# Patient Record
Sex: Female | Born: 1950 | Race: White | Hispanic: No | Marital: Married | Smoking: Former smoker
Health system: Southern US, Community
[De-identification: ages and names within clinical notes are randomized; demographics above are authoritative.]

## PROBLEM LIST (undated history)

## (undated) DIAGNOSIS — E119 Type 2 diabetes mellitus without complications: Secondary | ICD-10-CM

## (undated) DIAGNOSIS — F419 Anxiety disorder, unspecified: Secondary | ICD-10-CM

## (undated) DIAGNOSIS — L900 Lichen sclerosus et atrophicus: Secondary | ICD-10-CM

## (undated) DIAGNOSIS — F32A Depression, unspecified: Secondary | ICD-10-CM

## (undated) DIAGNOSIS — M1990 Unspecified osteoarthritis, unspecified site: Secondary | ICD-10-CM

## (undated) DIAGNOSIS — I471 Supraventricular tachycardia, unspecified: Secondary | ICD-10-CM

## (undated) DIAGNOSIS — F329 Major depressive disorder, single episode, unspecified: Secondary | ICD-10-CM

## (undated) DIAGNOSIS — K219 Gastro-esophageal reflux disease without esophagitis: Secondary | ICD-10-CM

## (undated) DIAGNOSIS — E785 Hyperlipidemia, unspecified: Secondary | ICD-10-CM

## (undated) DIAGNOSIS — J40 Bronchitis, not specified as acute or chronic: Secondary | ICD-10-CM

## (undated) DIAGNOSIS — I10 Essential (primary) hypertension: Secondary | ICD-10-CM

## (undated) HISTORY — DX: Type 2 diabetes mellitus without complications: E11.9

## (undated) HISTORY — DX: Lichen sclerosus et atrophicus: L90.0

## (undated) HISTORY — DX: Gastro-esophageal reflux disease without esophagitis: K21.9

## (undated) HISTORY — DX: Supraventricular tachycardia: I47.1

## (undated) HISTORY — DX: Hyperlipidemia, unspecified: E78.5

## (undated) HISTORY — DX: Supraventricular tachycardia, unspecified: I47.10

## (undated) HISTORY — DX: Essential (primary) hypertension: I10

## (undated) HISTORY — DX: Major depressive disorder, single episode, unspecified: F32.9

## (undated) HISTORY — DX: Anxiety disorder, unspecified: F41.9

## (undated) HISTORY — DX: Depression, unspecified: F32.A

## (undated) HISTORY — DX: Unspecified osteoarthritis, unspecified site: M19.90

---

## 1999-10-19 ENCOUNTER — Other Ambulatory Visit: Admission: RE | Admit: 1999-10-19 | Discharge: 1999-10-19 | Payer: Self-pay | Admitting: Family Medicine

## 1999-11-01 ENCOUNTER — Ambulatory Visit (HOSPITAL_COMMUNITY): Admission: RE | Admit: 1999-11-01 | Discharge: 1999-11-01 | Payer: Self-pay | Admitting: Family Medicine

## 1999-11-01 ENCOUNTER — Encounter: Payer: Self-pay | Admitting: Family Medicine

## 2002-01-10 ENCOUNTER — Encounter: Payer: Self-pay | Admitting: Emergency Medicine

## 2002-01-10 ENCOUNTER — Emergency Department (HOSPITAL_COMMUNITY): Admission: EM | Admit: 2002-01-10 | Discharge: 2002-01-10 | Payer: Self-pay | Admitting: Emergency Medicine

## 2002-11-30 ENCOUNTER — Emergency Department (HOSPITAL_COMMUNITY): Admission: EM | Admit: 2002-11-30 | Discharge: 2002-11-30 | Payer: Self-pay | Admitting: Emergency Medicine

## 2003-01-28 ENCOUNTER — Emergency Department (HOSPITAL_COMMUNITY): Admission: EM | Admit: 2003-01-28 | Discharge: 2003-01-28 | Payer: Self-pay | Admitting: *Deleted

## 2003-10-26 ENCOUNTER — Emergency Department (HOSPITAL_COMMUNITY): Admission: EM | Admit: 2003-10-26 | Discharge: 2003-10-26 | Payer: Self-pay | Admitting: Emergency Medicine

## 2003-11-18 ENCOUNTER — Emergency Department (HOSPITAL_COMMUNITY): Admission: EM | Admit: 2003-11-18 | Discharge: 2003-11-18 | Payer: Self-pay | Admitting: Emergency Medicine

## 2004-01-25 ENCOUNTER — Ambulatory Visit (HOSPITAL_COMMUNITY): Admission: RE | Admit: 2004-01-25 | Discharge: 2004-01-25 | Payer: Self-pay | Admitting: Pulmonary Disease

## 2004-03-02 ENCOUNTER — Ambulatory Visit (HOSPITAL_COMMUNITY): Admission: RE | Admit: 2004-03-02 | Discharge: 2004-03-02 | Payer: Self-pay | Admitting: Emergency Medicine

## 2004-06-14 ENCOUNTER — Emergency Department (HOSPITAL_COMMUNITY): Admission: EM | Admit: 2004-06-14 | Discharge: 2004-06-14 | Payer: Self-pay | Admitting: *Deleted

## 2005-07-25 ENCOUNTER — Emergency Department (HOSPITAL_COMMUNITY): Admission: EM | Admit: 2005-07-25 | Discharge: 2005-07-26 | Payer: Self-pay | Admitting: Emergency Medicine

## 2005-08-14 ENCOUNTER — Ambulatory Visit (HOSPITAL_COMMUNITY): Admission: RE | Admit: 2005-08-14 | Discharge: 2005-08-14 | Payer: Self-pay | Admitting: General Surgery

## 2005-08-20 HISTORY — PX: CHOLECYSTECTOMY: SHX55

## 2005-09-05 ENCOUNTER — Ambulatory Visit (HOSPITAL_COMMUNITY): Admission: RE | Admit: 2005-09-05 | Discharge: 2005-09-05 | Payer: Self-pay | Admitting: General Surgery

## 2005-09-05 ENCOUNTER — Encounter (INDEPENDENT_AMBULATORY_CARE_PROVIDER_SITE_OTHER): Payer: Self-pay | Admitting: General Surgery

## 2005-10-06 ENCOUNTER — Emergency Department (HOSPITAL_COMMUNITY): Admission: EM | Admit: 2005-10-06 | Discharge: 2005-10-06 | Payer: Self-pay | Admitting: Emergency Medicine

## 2007-09-16 ENCOUNTER — Ambulatory Visit: Payer: Self-pay | Admitting: Cardiology

## 2007-10-04 ENCOUNTER — Ambulatory Visit: Payer: Self-pay | Admitting: Cardiology

## 2007-11-07 ENCOUNTER — Ambulatory Visit: Payer: Self-pay | Admitting: Cardiology

## 2007-12-03 ENCOUNTER — Ambulatory Visit: Payer: Self-pay | Admitting: Cardiovascular Disease

## 2007-12-03 ENCOUNTER — Encounter (HOSPITAL_COMMUNITY): Admission: RE | Admit: 2007-12-03 | Discharge: 2008-01-02 | Payer: Self-pay | Admitting: Cardiology

## 2007-12-18 ENCOUNTER — Ambulatory Visit: Payer: Self-pay | Admitting: Cardiology

## 2008-01-07 ENCOUNTER — Ambulatory Visit: Payer: Self-pay | Admitting: Internal Medicine

## 2008-01-15 ENCOUNTER — Ambulatory Visit (HOSPITAL_COMMUNITY): Admission: RE | Admit: 2008-01-15 | Discharge: 2008-01-15 | Payer: Self-pay | Admitting: Family Medicine

## 2008-07-27 ENCOUNTER — Ambulatory Visit: Payer: Self-pay | Admitting: Cardiology

## 2008-07-27 DIAGNOSIS — K219 Gastro-esophageal reflux disease without esophagitis: Secondary | ICD-10-CM

## 2008-07-27 DIAGNOSIS — I471 Supraventricular tachycardia, unspecified: Secondary | ICD-10-CM | POA: Insufficient documentation

## 2008-07-27 DIAGNOSIS — J4530 Mild persistent asthma, uncomplicated: Secondary | ICD-10-CM | POA: Insufficient documentation

## 2008-12-05 ENCOUNTER — Emergency Department (HOSPITAL_COMMUNITY): Admission: EM | Admit: 2008-12-05 | Discharge: 2008-12-05 | Payer: Self-pay | Admitting: Emergency Medicine

## 2008-12-17 ENCOUNTER — Encounter (INDEPENDENT_AMBULATORY_CARE_PROVIDER_SITE_OTHER): Payer: Self-pay | Admitting: *Deleted

## 2009-04-28 ENCOUNTER — Encounter (INDEPENDENT_AMBULATORY_CARE_PROVIDER_SITE_OTHER): Payer: Self-pay | Admitting: *Deleted

## 2009-04-28 LAB — CONVERTED CEMR LAB
ALT: 40 units/L
AST: 28 units/L
CO2: 21 meq/L
Calcium: 9.9 mg/dL
Chloride: 104 meq/L
Creatinine, Ser: 0.85 mg/dL
HDL: 36 mg/dL
Hgb A1c MFr Bld: 8.4 %
LDL Cholesterol: 126 mg/dL
Potassium: 4.2 meq/L
Sodium: 141 meq/L
TSH: 1.302 microintl units/mL
Total Protein: 7.4 g/dL

## 2009-05-09 ENCOUNTER — Ambulatory Visit: Admission: RE | Admit: 2009-05-09 | Discharge: 2009-05-09 | Payer: Self-pay | Admitting: Family Medicine

## 2009-05-10 ENCOUNTER — Encounter: Payer: Self-pay | Admitting: Family Medicine

## 2009-05-10 ENCOUNTER — Ambulatory Visit (HOSPITAL_COMMUNITY): Admission: RE | Admit: 2009-05-10 | Discharge: 2009-05-10 | Payer: Self-pay | Admitting: Family Medicine

## 2009-05-19 ENCOUNTER — Other Ambulatory Visit: Admission: RE | Admit: 2009-05-19 | Discharge: 2009-05-19 | Payer: Self-pay | Admitting: Family Medicine

## 2009-06-21 ENCOUNTER — Ambulatory Visit: Payer: Self-pay | Admitting: Internal Medicine

## 2009-06-21 DIAGNOSIS — K625 Hemorrhage of anus and rectum: Secondary | ICD-10-CM | POA: Insufficient documentation

## 2009-06-21 DIAGNOSIS — K649 Unspecified hemorrhoids: Secondary | ICD-10-CM | POA: Insufficient documentation

## 2009-06-30 ENCOUNTER — Telehealth: Payer: Self-pay | Admitting: Cardiology

## 2009-07-01 ENCOUNTER — Encounter: Payer: Self-pay | Admitting: Internal Medicine

## 2009-07-06 ENCOUNTER — Telehealth (INDEPENDENT_AMBULATORY_CARE_PROVIDER_SITE_OTHER): Payer: Self-pay

## 2009-07-20 ENCOUNTER — Ambulatory Visit (HOSPITAL_COMMUNITY): Admission: RE | Admit: 2009-07-20 | Discharge: 2009-07-20 | Payer: Self-pay | Admitting: Internal Medicine

## 2009-07-20 ENCOUNTER — Ambulatory Visit: Payer: Self-pay | Admitting: Internal Medicine

## 2009-07-27 ENCOUNTER — Encounter (INDEPENDENT_AMBULATORY_CARE_PROVIDER_SITE_OTHER): Payer: Self-pay | Admitting: *Deleted

## 2009-08-25 ENCOUNTER — Encounter (INDEPENDENT_AMBULATORY_CARE_PROVIDER_SITE_OTHER): Payer: Self-pay | Admitting: *Deleted

## 2009-08-25 LAB — CONVERTED CEMR LAB
ALT: 25 units/L
BUN: 16 mg/dL
CO2: 23 meq/L
Calcium: 9.6 mg/dL
Chloride: 104 meq/L
Creatinine, Ser: 0.82 mg/dL
HDL: 34 mg/dL
Hgb A1c MFr Bld: 7.2 %
Potassium: 4.5 meq/L
Total Protein: 6.6 g/dL

## 2009-08-31 ENCOUNTER — Encounter (INDEPENDENT_AMBULATORY_CARE_PROVIDER_SITE_OTHER): Payer: Self-pay | Admitting: *Deleted

## 2010-01-12 ENCOUNTER — Telehealth (INDEPENDENT_AMBULATORY_CARE_PROVIDER_SITE_OTHER): Payer: Self-pay | Admitting: *Deleted

## 2010-01-23 ENCOUNTER — Encounter: Payer: Self-pay | Admitting: Cardiology

## 2010-01-24 ENCOUNTER — Encounter: Payer: Self-pay | Admitting: Adult Health

## 2010-01-24 ENCOUNTER — Ambulatory Visit: Payer: Self-pay | Admitting: Cardiology

## 2010-01-24 DIAGNOSIS — J45901 Unspecified asthma with (acute) exacerbation: Secondary | ICD-10-CM | POA: Insufficient documentation

## 2010-01-24 DIAGNOSIS — Z8679 Personal history of other diseases of the circulatory system: Secondary | ICD-10-CM | POA: Insufficient documentation

## 2010-01-24 DIAGNOSIS — R002 Palpitations: Secondary | ICD-10-CM | POA: Insufficient documentation

## 2010-04-17 ENCOUNTER — Telehealth: Payer: Self-pay | Admitting: Internal Medicine

## 2010-04-17 ENCOUNTER — Ambulatory Visit: Payer: Self-pay | Admitting: Internal Medicine

## 2010-05-31 ENCOUNTER — Telehealth: Payer: Self-pay | Admitting: Internal Medicine

## 2010-06-13 ENCOUNTER — Encounter: Payer: Self-pay | Admitting: Internal Medicine

## 2010-06-20 HISTORY — PX: OTHER SURGICAL HISTORY: SHX169

## 2010-06-26 ENCOUNTER — Telehealth: Payer: Self-pay | Admitting: Internal Medicine

## 2010-06-27 ENCOUNTER — Encounter: Payer: Self-pay | Admitting: Internal Medicine

## 2010-07-03 ENCOUNTER — Ambulatory Visit (HOSPITAL_COMMUNITY): Admission: RE | Admit: 2010-07-03 | Payer: Self-pay | Admitting: Family Medicine

## 2010-07-06 ENCOUNTER — Ambulatory Visit: Payer: Self-pay | Admitting: Internal Medicine

## 2010-07-06 ENCOUNTER — Ambulatory Visit (HOSPITAL_COMMUNITY): Admission: RE | Admit: 2010-07-06 | Discharge: 2010-07-07 | Payer: Self-pay | Admitting: Internal Medicine

## 2010-08-01 ENCOUNTER — Ambulatory Visit: Payer: Self-pay | Admitting: Internal Medicine

## 2010-08-01 DIAGNOSIS — E119 Type 2 diabetes mellitus without complications: Secondary | ICD-10-CM

## 2010-08-03 ENCOUNTER — Telehealth (INDEPENDENT_AMBULATORY_CARE_PROVIDER_SITE_OTHER): Payer: Self-pay | Admitting: *Deleted

## 2010-08-28 ENCOUNTER — Telehealth (INDEPENDENT_AMBULATORY_CARE_PROVIDER_SITE_OTHER): Payer: Self-pay | Admitting: *Deleted

## 2010-09-09 ENCOUNTER — Encounter: Payer: Self-pay | Admitting: Family Medicine

## 2010-09-19 NOTE — Progress Notes (Signed)
Summary: re appt prior to ablation   Phone Note Call from Patient   Caller:  [phone] Patient Reason for Call: Talk to Nurse Summary of Call: pt calling re appt for office visit prior to ablation , said she was told she would hgave to come back in if her appt has been over a month, and she cannot afford the copay-what can she do? Initial call taken by: Glynda Jaeger,  June 26, 2010 4:05 PM  Follow-up for Phone Call        pt aware to be at the hospital at 10:30 and not to eat or drink after midnight  She is to hold all diabetic meds and Dr Johney Frame is going to do H&P at the hospital.   Dennis Bast, RN, BSN  June 27, 2010 10:56 AM

## 2010-09-19 NOTE — Letter (Signed)
Summary: EKG 01-11-10  EKG 01-11-10   Imported By: Faythe Ghee 01/23/2010 12:26:14  _____________________________________________________________________  External Attachment:    Type:   Image     Comment:   External Document

## 2010-09-19 NOTE — Assessment & Plan Note (Signed)
Summary: nep      Allergies Added: NKDA  Visit Type:  Follow-up Primary Provider:  Renaye Rakers   History of Present Illness: Ms Lastra is a pleasant 60 yo WF with a h/o SVT, asthma, DM, and HTN who presents today for EP evaluation.  She has had a long history of tachy palpitations and documented SVTs, at rates up to over 200 beats per minute.   She describes abrupt onset/ offset of tachypalpitations, lasting <10 minutes.  She is unaware of triggers/ precipitants for these.  She reports that with valsalva maneuvers, tachycardia would occasionally terminate, however since being on diltiazem, she denies having these episodes. She also reports episodes of chest tightness with beating into her throat.  She states that when she has been evaluated by EMS during these episodes, she has been in sinus bradycardia. In her SVT she has, what appears to be, a shortRP tachycardia with a pseudo R-prime in lead V1.  There was no evidence of any pre-excitation on her baseline ECGs.    Current Medications (verified): 1)  Metoprolol Tartrate 25 Mg Tabs (Metoprolol Tartrate) .... Take One Tablet By Mouth Twice A Day 2)  Onglyza 5 Mg Tabs (Saxagliptin Hcl) .... Once Daily 3)  Diltiazem Hcl Er Beads 360 Mg Xr24h-Cap (Diltiazem Hcl Er Beads) .... Take 1 Tablet By Mouth Once A Day 4)  Aspirin 325 Mg Tabs (Aspirin) .... Once Daily 5)  Glipizide 10 Mg Tabs (Glipizide) .... Take  1 Tab Two Times A Day 6)  Advair Diskus 500-50 Mcg/dose Aepb (Fluticasone-Salmeterol) .... Once Daily 7)  Vistaril 25 Mg Caps (Hydroxyzine Pamoate) .... As Needed 8)  Proventil Hfa 108 (90 Base) Mcg/act Aers (Albuterol Sulfate) .... As Needed 9)  Multivitamins  Tabs (Multiple Vitamin) .... Once Daily 10)  Clobetasol Propionate 0.05 % Crea (Clobetasol Propionate) .... Apply Two Times A Day To Affected Area 11)  Flonase 50 Mcg/act Susp (Fluticasone Propionate) .... One Spray Each Nostril Two Times A Day 12)  Simvastatin 40 Mg Tabs  (Simvastatin) .... Take 1 Tab Daily 13)  Fish Oil 1000 Mg Caps (Omega-3 Fatty Acids) .... Take 3 Caps Daily 14)  Metformin Hcl 850 Mg Tabs (Metformin Hcl) .... Take 1 Tab Three Times A Day 15)  Prilosec 20 Mg Cpdr (Omeprazole) .... Take 1 Tab Daily  Allergies (verified): No Known Drug Allergies  Past History:  Past Medical History: SVT Osteoarthritis-hands and knees. Asthma. 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 Depression. Hypertension Hyperlipidemia: Suboptimal profile in 04/2009 Depression Lichen sclerosis  Past Surgical History: Reviewed history from 07/27/2008 and no changes required. Cholecystectomy-2007. Bilateral tubal ligation-1978.  Family History: Reviewed history from 06/21/2009 and no changes required. Father: Living-history of hypertension, COPD and Parkinson's. Mother: Living-hypertension. One of 3 siblings  Hermansky-Pudlak syndrome, sister, age 59, has ostomy, free-bleeder, pulmonary fibrosis No FH of CRC, liver dz.  Social History: Unemployed.  Lives in [place]. Married with 3 children. Tob- quit 1980s No excessive alcohol use.  Review of Systems       All systems are reviewed and negative except as listed in the HPI.   Vital Signs:  Patient profile:   60 year old female Height:      62.5 inches Weight:      200 pounds BMI:     36.13 Pulse rate:   52 / minute BP sitting:   129 / 90  (left arm)  Vitals Entered By: Laurance Flatten CMA (April 17, 2010 11:06 AM)   Physical  Exam  General:  overweight, NAD Head:  normocephalic and atraumatic Eyes:  PERRLA/EOM intact; conjunctiva and lids normal. Mouth:  Teeth, gums and palate normal. Oral mucosa normal. Neck:  Neck supple, no JVD. No masses, thyromegaly or abnormal cervical nodes. Lungs:  Clear bilaterally to auscultation and percussion. Heart:  Non-displaced PMI, chest non-tender; regular rate and rhythm, S1, S2 without murmurs, rubs or gallops. Carotid upstroke normal, no  bruit. Normal abdominal aortic size, no bruits. Femorals normal pulses, no bruits. Pedals normal pulses. No edema, no varicosities. Abdomen:  Bowel sounds positive; abdomen soft and non-tender without masses, organomegaly, or hernias noted. No hepatosplenomegaly. Msk:  Back normal, normal gait. Muscle strength and tone normal. Pulses:  pulses normal in all 4 extremities Extremities:  No clubbing or cyanosis. Neurologic:  Alert and oriented x 3. Skin:  Intact without lesions or rashes. Cervical Nodes:  no significant adenopathy Psych:  Normal affect.   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  Therapeutic strategies for SVT including medicine and ablation were discussed in detail with the patient today. Risk, benefits, and alternatives to EP study and radiofrequency ablation were also discussed in detail today. These risks include but are not limited to stroke, bleeding, vascular damage, tamponade, perforation, damage to the heart and other structures, AV block requiring pacemaker, worsening renal function, and death. The patient understands these risk and wishes to further contemplate her options.  She will contact my office if she wishes to proceed.  She will decrease ASA to 81mg  daily today.  No other medicine changes are made.  Patient Instructions: 1)  You have been given information on ablations. Please call our office back if this is something you decide you would like to pursue.  2)  We will see you back on an as needed basis unless you decide to have an ablation.

## 2010-09-19 NOTE — Assessment & Plan Note (Signed)
Summary: rov  Medications Added GLIPIZIDE 10 MG TABS (GLIPIZIDE) take  1 tab two times a day FLONASE 50 MCG/ACT SUSP (FLUTICASONE PROPIONATE) one spray each nostril two times a day SIMVASTATIN 40 MG TABS (SIMVASTATIN) take 1 tab daily FISH OIL 1000 MG CAPS (OMEGA-3 FATTY ACIDS) take 3 caps daily METFORMIN HCL 850 MG TABS (METFORMIN HCL) take 1 tab three times a day PRILOSEC 20 MG CPDR (OMEPRAZOLE) take 1 tab daily ONGLYZA 5 MG TABS (SAXAGLIPTIN HCL) take 1 tab daily      Allergies Added: NKDA  Visit Type:  Follow-up Primary Provider:  Renaye Rakers  CC:  no cardiology complaints today.  History of Present Illness: [name] is a 60 y/o CF that we are following for frequent arrythmias to include SVT. She has been on medical therapy for this to include metoprolol, and diltiazem.  She also has history of asthma, hyperlipidemia, and GERD.  She has been reluctant to proceed with EP evaluation secondary to cost, and up until now,she has been controlled on medical management.    However, 2 weeks ago she called EMS secondary to dizziness and weakness with associated SOB.  The symptoms had passed by the time EMS got to her home. She was advised to rest and to call our office.  Unfortunately the patient states the symptoms returned shortly after EMS left. She describes also a "thumping" "flip-flop" with mild dizziness. She was evaluated at [facility] as she lives closer to there. She was evaluated and found to be bradycardic.  She was also hyperglycemic with glucose of 232.  The ER felt that she may ai little dehydrated and gave her 1 liter of fluid even though BP was 118/67, Creat 0.74., K 4.1.  She has had no futher episodes but remains concerned that the symptoms have returned despite medications.  She now has some insurance coverage and wishes to proceed with EP evaluation.  Current Medications (verified): 1)  Metoprolol Tartrate 25 Mg Tabs (Metoprolol Tartrate) .... Take One Tablet By  Mouth Twice A Day 2)  Onglyza 5 Mg Tabs (Saxagliptin Hcl) .... Once Daily 3)  Singulair 10 Mg Tabs (Montelukast Sodium) .... Once Daily 4)  Diltiazem Hcl Er Beads 360 Mg Xr24h-Cap (Diltiazem Hcl Er Beads) .... Take 1 Tablet By Mouth Once A Day 5)  Aspirin 325 Mg Tabs (Aspirin) .... Once Daily 6)  Metformin Hcl 1000 Mg Tabs (Metformin Hcl) .... Two Times A Day 7)  Glipizide 10 Mg Tabs (Glipizide) .... Take  1 Tab Two Times A Day 8)  Advair Diskus 500-50 Mcg/dose Aepb (Fluticasone-Salmeterol) .... Once Daily 9)  Combivent 103-18 Mcg/act Aero (Ipratropium-Albuterol) .Marland Kitchen.. 1 Puff Two Times A Day 10)  Vistaril 25 Mg Caps (Hydroxyzine Pamoate) .... As Needed 11)  Proventil Hfa 108 (90 Base) Mcg/act Aers (Albuterol Sulfate) .... As Needed 12)  Multivitamins  Tabs (Multiple Vitamin) .... Once Daily 13)  Clobetasol Propionate 0.05 % Crea (Clobetasol Propionate) .... Apply Two Times A Day To Affected Area 14)  Flonase 50 Mcg/act Susp (Fluticasone Propionate) .... One Spray Each Nostril Two Times A Day 15)  Simvastatin 40 Mg Tabs (Simvastatin) .... Take 1 Tab Daily 16)  Fish Oil 1000 Mg Caps (Omega-3 Fatty Acids) .... Take 3 Caps Daily 17)  Metformin Hcl 850 Mg Tabs (Metformin Hcl) .... Take 1 Tab Three Times A Day 18)  Prilosec 20 Mg Cpdr (Omeprazole) .... Take 1 Tab Daily 19)  Onglyza 5 Mg Tabs (Saxagliptin Hcl) .... Take 1 Tab Daily  Allergies (  verified): No Known Drug Allergies PMH-FH-SH reviewed-no changes except otherwise noted  Review of Systems       Palpatations.Dizziness. All other systems have been reviewed and are negative unless stated above.   Vital Signs:  Patient profile:   60 year old female Weight:      199 pounds BMI:     35.95 Pulse rate:   58 / minute BP sitting:   119 / 69  (right arm)  Vitals Entered By: Dreama Saa, CNA (January 24, 2010 2:00 PM)  Physical Exam  General:  Well developed, well nourished, in no acute distress. Lungs:  Clear bilaterally to  auscultation and percussion. Heart:  Non-displaced PMI, chest non-tender; regular rate and rhythm, S1, S2 without murmurs, rubs or gallops. Carotid upstroke normal, no bruit. Normal abdominal aortic size, no bruits. Femorals normal pulses, no bruits. Pedals normal pulses. No edema, no varicosities. Abdomen:  Bowel sounds positive; abdomen soft and non-tender without masses, organomegaly, or hernias noted. No hepatosplenomegaly. Msk:  Back normal, normal gait. Muscle strength and tone normal. Extremities:  No clubbing or cyanosis. Neurologic:  Alert and oriented x 3.   Impression & Recommendations:  Problem # 1:  ARRHYTHMIA, HX OF (ICD-V12.50) Assessment Deteriorated Will plan on referral to Dr. Hillis Range in [place] office.  if continued care/managment will be needed, she can see him in [place]. She wishes to follow in [place] as it is closer to her home and will save her gas money. I will make no changes in medication regimin at this time, deferring to Dr. Johney Frame to make his recommendations. Orders: Cardiology Referral (Cardiology)  Problem # 2:  ASTHMA UNSPECIFIED WITH EXACERBATION (OZD-664.40) Assessment: Unchanged  Her updated medication list for this problem includes:    Singulair 10 Mg Tabs (Montelukast sodium) ..... Once daily    Advair Diskus 500-50 Mcg/dose Aepb (Fluticasone-salmeterol) ..... Once daily    Combivent 103-18 Mcg/act Aero (Ipratropium-albuterol) .Marland Kitchen... 1 puff two times a day    Proventil Hfa 108 (90 Base) Mcg/act Aers (Albuterol sulfate) .Marland Kitchen... As needed  Patient Instructions: 1)  Your physician recommends that you schedule a follow-up appointment in: [facility] with Dr. Johney Frame. May follow up at [facility] office. 2)  Your physician recommends that you continue on your current medications as directed. Please refer to the Current Medication list given to you today.

## 2010-09-19 NOTE — Miscellaneous (Signed)
Summary: labs cmp,lipid,a1c,08/25/2009  Clinical Lists Changes  Observations: Added new observation of CALCIUM: 9.6 mg/dL (16/05/9603 5:40) Added new observation of ALBUMIN: 4.3 g/dL (98/06/9146 8:29) Added new observation of PROTEIN, TOT: 6.6 g/dL (56/21/3086 5:78) Added new observation of SGPT (ALT): 25 units/L (08/25/2009 9:56) Added new observation of SGOT (AST): 18 units/L (08/25/2009 9:56) Added new observation of ALK PHOS: 41 units/L (08/25/2009 9:56) Added new observation of CREATININE: 0.82 mg/dL (46/96/2952 8:41) Added new observation of BUN: 16 mg/dL (32/44/0102 7:25) Added new observation of BG RANDOM: 132 mg/dL (36/64/4034 7:42) Added new observation of CO2 PLSM/SER: 23 meq/L (08/25/2009 9:56) Added new observation of CL SERUM: 104 meq/L (08/25/2009 9:56) Added new observation of K SERUM: 4.5 meq/L (08/25/2009 9:56) Added new observation of NA: 142 meq/L (08/25/2009 9:56) Added new observation of LDL: 55 mg/dL (59/56/3875 6:43) Added new observation of HDL: 34 mg/dL (32/95/1884 1:66) Added new observation of TRIGLYC TOT: 147 mg/dL (02/17/1600 0:93) Added new observation of CHOLESTEROL: 118 mg/dL (23/55/7322 0:25) Added new observation of HGBA1C: 7.2 % (08/25/2009 9:56)

## 2010-09-19 NOTE — Progress Notes (Signed)
Summary: needs svt broshure   Phone Note Call from Patient   Caller: Patient Reason for Call: Talk to Nurse Summary of Call: lost broshure on svt can she get another mailes to her? Initial call taken by: Glynda Jaeger,  April 17, 2010 3:01 PM  Follow-up for Phone Call        Info on ablations was pulled and mailed to the pt. She is aware. Follow-up by: Sherri Rad, RN, BSN,  April 17, 2010 3:58 PM

## 2010-09-19 NOTE — Letter (Signed)
Summary: ER VISIT 01-11-10  ER VISIT 01-11-10   Imported By: Faythe Ghee 01/23/2010 12:24:21  _____________________________________________________________________  External Attachment:    Type:   Image     Comment:   External Document

## 2010-09-19 NOTE — Letter (Signed)
Summary: PORTABLE CHEST 01-11-10  PORTABLE CHEST 01-11-10   Imported By: Faythe Ghee 01/23/2010 12:25:49  _____________________________________________________________________  External Attachment:    Type:   Image     Comment:   External Document

## 2010-09-19 NOTE — Progress Notes (Signed)
Summary: pt ready to set up ablation   Phone Note Call from Patient Call back at Home Phone [phone]   Caller: Patient Reason for Call: Talk to Nurse, Talk to Doctor Summary of Call: pt is ready to set up ablation Initial call taken by: Omer Jack,  May 31, 2010 11:49 AM  Follow-up for Phone Call        to come in for H&P on 06/28/10 at 9am and procedure on 07/06/10 at  12:30  pt aware Dennis Bast, RN, BSN  June 01, 2010 1:16 PM

## 2010-09-19 NOTE — Progress Notes (Signed)
Summary: pt seen in ED last night.   Phone Note Call from Patient Call back at [phone]   Caller: Patient Reason for Call: Talk to Nurse Summary of Call: S: pt was seen in ED yesterday for palpitations and had a HR of 50 but when she feel asleep it droped down to HR 40. Initial call taken by: Faythe Ghee,  Jan 12, 2010 9:38 AM  Follow-up for Phone Call        pt was seen at [facility] ER 01/11/2010, requested records, rescheduled pt to see [facility] on 01/24/2010 Follow-up by: Teressa Lower RN,  Jan 12, 2010 4:53 PM

## 2010-09-19 NOTE — Letter (Signed)
Summary: ELectrophysiology/Ablation Procedure Instructions  [facility], Main Office  [address]   [place], [place] [address]   Phone: [phone]  Fax: [phone]     Electrophysiology/Ablation Procedure Instructions    You are scheduled for a(n) SVT ablation on 07/06/10 at 12:30 with Dr. Johney Frame.  1.  Please come to the Short Stay Center at [facility] at 10:30am on the day of your procedure.  2.  Come prepared to stay overnight.   Please bring your insurance cards and a list of your medications.  3.  Labs to be done at Short stay at the hospital per Dr Allred  4.  Do not have anything to eat or drink after midnight the night before your procedure.  5.  Do NOT take these medications for the morning of your  procedure unless otherwise instructed: HOLD ALL DIABETIC medicationsAll of your remaining medications may be taken with a small amount of water.  6.  Educational material received:  Ablation   * Occasionally, EP studies and ablations can become lengthy.  Please make your family aware of this before your procedure starts.  Average time ranges from 2-8 hours for EP studies/ablations.  Your physician will locate your family after the procedure with the results.  * If you have any questions after you get home, please call the office at [phone]. [name]

## 2010-09-19 NOTE — Letter (Signed)
Summary: LABS 01-11-10  LABS 01-11-10   Imported By: Faythe Ghee 01/23/2010 12:25:15  _____________________________________________________________________  External Attachment:    Type:   Image     Comment:   External Document

## 2010-09-21 NOTE — Progress Notes (Signed)
   Received Physicians Statement from [facility] sent to [employer]  August 03, 2010 3:57 PM

## 2010-09-21 NOTE — Progress Notes (Signed)
----   Converted from flag ----flag received   ---- 08/28/2010 11:51 AM, Bary Leriche wrote: Minerva Areola, I am sending Dr. Johney Frame a [employer] form for his patient Clella Mckeel dob 16-Nov-2050, to review.   Thank you, Elease Hashimoto at [employer]. ------------------------------  Appended Document:  Charlies Silvers a Flag,Pt called asking about Physician's Statement paper? She needs this ASAP

## 2010-09-21 NOTE — Assessment & Plan Note (Signed)
Summary: eph/post ablation/amber      Allergies Added: NKDA  Visit Type:  Follow-up Primary Aina Rossbach:  Renaye Rakers   History of Present Illness: The patient presents today for routine electrophysiology followup. She reports doing well since her recent EP study.  She had a URI which has resolved. She denies procedure related complications.  She has had no further symptoms of SVT.   The patient denies symptoms of palpitations, chest pain, shortness of breath, orthopnea, PND, lower extremity edema, dizziness, presyncope, syncope, or neurologic sequela. The patient is tolerating medications without difficulties and is otherwise without complaint today.   Preventive Screening-Counseling & Management  Alcohol-Tobacco     Smoking Status: quit     Year Quit: 1970  Current Medications (verified): 1)  Metoprolol Tartrate 25 Mg Tabs (Metoprolol Tartrate) .... Take One Tablet By Mouth Twice A Day 2)  Onglyza 5 Mg Tabs (Saxagliptin Hcl) .... Once Daily 3)  Diltiazem Hcl Er Beads 360 Mg Xr24h-Cap (Diltiazem Hcl Er Beads) .... Take 1 Tablet By Mouth Once A Day 4)  Aspirin 325 Mg Tabs (Aspirin) .... Once Daily 5)  Glipizide 10 Mg Tabs (Glipizide) .... Take  1 Tab Two Times A Day 6)  Advair Diskus 500-50 Mcg/dose Aepb (Fluticasone-Salmeterol) .... Once Daily 7)  Vistaril 25 Mg Caps (Hydroxyzine Pamoate) .... As Needed 8)  Proventil Hfa 108 (90 Base) Mcg/act Aers (Albuterol Sulfate) .... As Needed 9)  Multivitamins  Tabs (Multiple Vitamin) .... Once Daily 10)  Clobetasol Propionate 0.05 % Crea (Clobetasol Propionate) .... Apply Two Times A Day To Affected Area 11)  Flonase 50 Mcg/act Susp (Fluticasone Propionate) .... One Spray Each Nostril Two Times A Day 12)  Simvastatin 40 Mg Tabs (Simvastatin) .... Take 1 Tab Daily 13)  Fish Oil 1000 Mg Caps (Omega-3 Fatty Acids) .... Take 3 Caps Daily 14)  Metformin Hcl 850 Mg Tabs (Metformin Hcl) .... Take 1 Tab Three Times A Day 15)  Prilosec 20 Mg Cpdr  (Omeprazole) .... Take 1 Tab Daily  Allergies (verified): No Known Drug Allergies  Comments:  Nurse/Medical Assistant: The patient is currently on medications but does not know the name or dosage at this time. Instructed to contact our office with details. Will update medication list at that time.  Past History:  Past Medical History: SVT- negative EP study 11/11 Osteoarthritis-hands and knees. Asthma. 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 Depression. Hypertension Hyperlipidemia: Suboptimal profile in 04/2009 Depression Lichen sclerosis  Past Surgical History: Cholecystectomy-2007. Bilateral tubal ligation-1978. EP study 11/11- no inducible arrhythmias  Social History: Reviewed history from 04/17/2010 and no changes required. Unemployed.  Lives in [place]. Married with 3 children. Tob- quit 1980s No excessive alcohol use.Smoking Status:  quit  Vital Signs:  Patient profile:   60 year old female Height:      62.5 inches Weight:      204 pounds Pulse rate:   64 / minute BP sitting:   111 / 73  (left arm) Cuff size:   large  Vitals Entered By: Carlye Grippe (August 01, 2010 2:30 PM)  Physical Exam  General:  Well developed, well nourished, in no acute distress. Head:  normocephalic and atraumatic Eyes:  PERRLA/EOM intact; conjunctiva and lids normal. Mouth:  Teeth, gums and palate normal. Oral mucosa normal. Neck:  supple Lungs:  Clear bilaterally to auscultation and percussion. Heart:  Non-displaced PMI, chest non-tender; regular rate and rhythm, S1, S2 without murmurs, rubs or gallops. Carotid upstroke normal, no bruit. Normal abdominal  aortic size, no bruits. Femorals normal pulses, no bruits. Pedals normal pulses. No edema, no varicosities. Abdomen:  Bowel sounds positive; abdomen soft and non-tender without masses, organomegaly, or hernias noted. No hepatosplenomegaly. Msk:  Back normal, normal gait. Muscle strength and tone  normal. Extremities:  No clubbing or cyanosis. Neurologic:  Alert and oriented x 3. Skin:  Intact without lesions or rashes. Psych:  Normal affect.   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  She did have dual AV nodal physiology but no evidence of SVT.  No ablation performed.  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  I have given 30 days of metformin (Script) until she can be seen by her primary care physician.  Patient Instructions: 1)  Your physician recommends that you continue on your current medications as directed. Please refer to the Current Medication list given to you today. 2)  Follow up in  6 months Prescriptions: METFORMIN HCL 850 MG TABS (METFORMIN HCL) take 1 tab three times a day  #90 x 0   Entered by:   Hoover Brunette, LPN   Authorized by:   Hillis Range, MD   Signed by:   Hoover Brunette, LPN on 16/05/9603   Method used:   Electronically to        [facility]* (retail)       [address]       [place], [place]  [address]       Ph: [phone]       Fax: [phone]   RxID:   [id number]

## 2010-10-31 LAB — GLUCOSE, CAPILLARY
Glucose-Capillary: 157 mg/dL — ABNORMAL HIGH (ref 70–99)
Glucose-Capillary: 160 mg/dL — ABNORMAL HIGH (ref 70–99)
Glucose-Capillary: 244 mg/dL — ABNORMAL HIGH (ref 70–99)

## 2010-10-31 LAB — CBC
Hemoglobin: 13 g/dL (ref 12.0–15.0)
Platelets: 239 10*3/uL (ref 150–400)
RBC: 4.69 MIL/uL (ref 3.87–5.11)
WBC: 7.9 10*3/uL (ref 4.0–10.5)

## 2010-10-31 LAB — BASIC METABOLIC PANEL
Calcium: 9.7 mg/dL (ref 8.4–10.5)
Chloride: 105 mEq/L (ref 96–112)
Creatinine, Ser: 0.68 mg/dL (ref 0.4–1.2)
GFR calc Af Amer: >60 mL/min (ref >60)
Sodium: 139 mEq/L (ref 135–145)

## 2010-10-31 LAB — APTT: aPTT: 25 seconds (ref 24–37)

## 2010-10-31 LAB — PROTIME-INR
INR: 0.89 (ref 0.00–1.49)
Prothrombin Time: 12.3 seconds (ref 11.6–15.2)

## 2010-11-21 ENCOUNTER — Other Ambulatory Visit: Payer: Self-pay | Admitting: Adult Health

## 2010-11-21 LAB — GLUCOSE, CAPILLARY: Glucose-Capillary: 148 mg/dL — ABNORMAL HIGH (ref 70–99)

## 2010-11-29 LAB — CBC
HCT: 35 % — ABNORMAL LOW (ref 36.0–46.0)
Platelets: 211 10*3/uL (ref 150–400)
RDW: 13.7 % (ref 11.5–15.5)

## 2010-11-29 LAB — COMPREHENSIVE METABOLIC PANEL
AST: 46 U/L — ABNORMAL HIGH (ref 0–37)
Albumin: 3.8 g/dL (ref 3.5–5.2)
Alkaline Phosphatase: 53 U/L (ref 39–117)
BUN: 12 mg/dL (ref 6–23)
GFR calc Af Amer: >60 mL/min (ref >60)
Potassium: 3.8 mEq/L (ref 3.5–5.1)
Sodium: 136 mEq/L (ref 135–145)
Total Protein: 6.2 g/dL (ref 6.0–8.3)

## 2010-11-29 LAB — DIFFERENTIAL
Basophils Relative: 1 % (ref 0–1)
Monocytes Absolute: 0.4 10*3/uL (ref 0.1–1.0)
Monocytes Relative: 7 % (ref 3–12)
Neutro Abs: 4.2 10*3/uL (ref 1.7–7.7)

## 2010-11-29 LAB — POCT CARDIAC MARKERS
CKMB, poc: <1 ng/mL — ABNORMAL LOW (ref 1.0–8.0)
Myoglobin, poc: 36.6 ng/mL (ref 12–200)
Myoglobin, poc: 44.2 ng/mL (ref 12–200)

## 2010-11-29 LAB — D-DIMER, QUANTITATIVE: D-Dimer, Quant: 0.31 ug/mL-FEU (ref 0.00–0.48)

## 2010-12-05 ENCOUNTER — Telehealth: Payer: Self-pay | Admitting: Internal Medicine

## 2010-12-05 NOTE — Telephone Encounter (Signed)
Refill needed for diltiazem 360mg  1 a day and metoprolol 25mg  1 tab twice a day -[facility]

## 2010-12-06 NOTE — Telephone Encounter (Signed)
[place] patient/saw Samara Deist last.

## 2010-12-07 ENCOUNTER — Other Ambulatory Visit (HOSPITAL_COMMUNITY): Payer: Self-pay | Admitting: Family Medicine

## 2010-12-07 ENCOUNTER — Telehealth: Payer: Self-pay

## 2010-12-07 DIAGNOSIS — Z139 Encounter for screening, unspecified: Secondary | ICD-10-CM

## 2010-12-07 MED ORDER — DILTIAZEM HCL ER BEADS 360 MG PO CP24: 360.0000 mg | ORAL_CAPSULE | Freq: Every day | ORAL | Status: DC

## 2010-12-07 MED ORDER — METOPROLOL TARTRATE 25 MG PO TABS: 25.0000 mg | ORAL_TABLET | Freq: Two times a day (BID) | ORAL | Status: DC

## 2010-12-14 ENCOUNTER — Ambulatory Visit (HOSPITAL_COMMUNITY): Payer: Medicare Other

## 2011-01-01 ENCOUNTER — Ambulatory Visit (HOSPITAL_COMMUNITY)
Admission: RE | Admit: 2011-01-01 | Discharge: 2011-01-01 | Disposition: A | Discharge status: Home / Self Care | Payer: Medicare Other | Source: Ambulatory Visit | Attending: Family Medicine | Admitting: Family Medicine

## 2011-01-01 DIAGNOSIS — Z1231 Encounter for screening mammogram for malignant neoplasm of breast: Secondary | ICD-10-CM | POA: Insufficient documentation

## 2011-01-01 DIAGNOSIS — Z139 Encounter for screening, unspecified: Secondary | ICD-10-CM

## 2011-01-02 NOTE — Assessment & Plan Note (Signed)
[facility] HEALTHCARE                       [place] CARDIOLOGY OFFICE NOTE   [name], [name]                      MRN:          [id number]  DATE:09/16/2007                            DOB:          1950-10-21    REFERRING PHYSICIAN:  [facility] Department   [name] returns to the office at the request of her primary care  providers at the health department after being lost to followup.  I  originally saw her for palpitations with a possible history of PSVT but  no documentation.  There were issues with healthcare coverage, but she  now has Secured Medicaid.  She has continued to have episodic  palpitations that last for a number of minutes, but have not been  persistent enough to allow her to reach a healthcare facility.  There  are no associated symptoms.  She is otherwise feeling and doing fairly  well.  A disability application is in progress.   Past medical, social, and family history were updated.  She has had no  significant medical issues since her last visit.  Asthma has been in  good control.  Her diabetes has been in good control on oral agents.   MEDICATIONS:  Unchanged from her last visit, except for the addition of  naproxen 500 mg b.i.d.   PHYSICAL EXAMINATION:  GENERAL:  Overweight pleasant woman, in no acute  distress.  VITAL SIGNS:  The weight is 208, 24 pounds less than at her last visit.  Blood pressure 100/75, heart rate 66 and regular, respirations 18.  NECK:  No jugular venous distention; normal carotid upstrokes, without  bruits.  HEENT:  EOMs full; normal lids and conjunctivae; normal oral mucosa.  ENDOCRINE:  No thyromegaly.  LUNGS:  Clear.  CARDIAC:  Normal first and second heart sounds; modest systolic ejection  murmur.  ABDOMEN:  Soft and nontender; no organomegaly.  EXTREMITIES:  No edema; distal pulses intact.   TSH drawn at her last visit was normal.  This was recently repeated at  the health  department.  Hemoglobin A1c is suboptimal at 8.  Electrolytes, renal function, and LFTs are normal.   IMPRESSION:  [name] is doing well overall.  We will provide her  with an event recorder in the hopes of documenting her arrhythmia.  I  will reassess this nice woman in 1 month.     Gerrit Friends. Dietrich Pates, MD, [facility]  Electronically Signed   RMR/MedQ  DD: 09/16/2007  DT: 09/17/2007  Job #: [id number]   cc:   [facility]

## 2011-01-02 NOTE — Assessment & Plan Note (Signed)
[facility] HEALTHCARE                       [place] CARDIOLOGY OFFICE NOTE   MARKIAH, JANEWAY                      MRN:          [id number]  DATE:11/07/2007                            DOB:          [birth year]    REFERRING PHYSICIAN:  Health Department [facility]   Ms. Gouldin returns to the office for continued assessment and treatment  of a history of PSVT.  She carried an event recorder for 1 month.  This  initially documented PVST at a rate of 150.  She was started on  diltiazem with no further episodes; however, she noted recurrent  palpitations this morning at 4 a.m. and comes into the office with a  heart rate of 150.  She tolerates this well.  She notes no dyspnea nor  chest discomfort.  She feels fatigued when in her arrhythmia.  She  describes anxiety recently related to her financial status.  She also  went to the dentist yesterday and had an anesthetic injection prior to  her dental work.  She denies excessive use of caffeine, but drinks 1 or  2 cups of coffee in the morning.  She is utilizing no new over-the-  counter medications.   MEDICATIONS:  Medications are unchanged from her last visit except for  the addition of diltiazem 240 mg every day.   PHYSICAL EXAMINATION:  GENERAL:  On exam, pleasant overweight woman in  no acute distress.  VITAL SIGNS:  The weight is 210 pounds, 2 pounds more than at her last  visit.  Blood pressure 120/85, heart rate initially 140; subsequently  decreased to 76 spontaneously.  NECK:  No jugular venous distention; no carotid bruits.  LUNGS:  Clear.  CARDIAC:  Normal first and second heart sounds; minimal basilar systolic  ejection murmur.  EXTREMITIES:  No edema.   EKG:  PSVT with no visible atrial activity; incomplete right bundle  branch block; nonspecific ST-T wave abnormality.  When compared to a  prior tracing of October 10, 2005, the previous EKG showed only an  incomplete right bundle  branch block.   IMPRESSION:  Ms. Joellyn Quails is continuing to have paroxysmal  supraventricular tachycardia despite moderate doses of diltiazem.  With  her history of asthma, I am concerned about beta-blocker, but we will  add this medication at a low dose (25 mg b.i.d.).  We will increase  diltiazem to 360 mg every day with her next prescription.  We discussed  the possibility of radiofrequency ablation.  She would like to consider  this and discuss it with her family before committing to that option.  I  will reassess this nice woman again in 1 month.     Gerrit Friends. Dietrich Pates, MD, [facility]  Electronically Signed    RMR/MedQ  DD: 11/07/2007  DT: 11/07/2007  Job #: [id number]

## 2011-01-02 NOTE — Assessment & Plan Note (Signed)
[facility] HEALTHCARE                       [place] CARDIOLOGY OFFICE NOTE   [name], [name]                      MRN:          [id number]  DATE:07/27/2008                            DOB:          Dec 09, 1950    REFERRING PHYSICIAN:  [facility] Health Department   REFERRING:  [facility] Department.   [name] returns to the office for continued assessment and treatment  of paroxysmal supraventricular tachycardia.  She has not experienced  symptoms related to this problem in months.  She does have some  continuing arthritic discomfort of her hands, but this is fairly well  controlled with low-dose aspirin.  She has substantial asthma, but has  not required use of a nebulizer for sometime.  She uses 3 metered-dose  inhalers.  Diabetic control has been good on oral medication.  She has  recently been told of hyperlipidemia with plans to start a pharmacologic  agent.   Current medications include:  1. Prozac 20 mg daily.  2. Combivent inhaler b.i.d.  3. Proventil inhaler 2 puffs b.i.d.  4. Advair inhaler b.i.d.  5. Flonase b.i.d.  6. Metformin 1000 mg b.i.d.  7. Glucotrol 10 mg b.i.d.  8. Aspirin 325 mg daily.  9. Caltrate 1 daily.  10.Diltiazem 360 mg daily.  11.Metoprolol 25 mg b.i.d.  12.Omeprazole 20 mg daily.   [name] is relatively active, generally without difficulty.  She  does experience dyspnea on moderate exertion.   PHYSICAL EXAMINATION:  GENERAL:  Pleasant overweight woman in no acute  distress.  VITAL SIGNS:  The weight is 207, 2 pounds less than in May.  Blood  pressure 105/65, heart rate 70 and regular, respirations 14.  NECK:  No jugular venous distention; normal carotid upstrokes without  bruits.  LUNGS:  Clear.  CARDIAC:  Normal first and second heart sounds; modest systolic ejection  murmur.  ABDOMEN:  Soft and nontender; no organomegaly; normal bowel sounds  without bruits.  EXTREMITIES:  No  edema; normal distal pulses.   IMPRESSION:  [name] is doing well with respect to her arrhythmia.  Now that she no longer has Medicaid coverage, she cannot consider  radiofrequency ablation.  That is not a  significant problems since her arrhythmia is so well controlled with  medical therapy.  I will plan to reassess this nice woman again in 1  year, sooner should symptoms recur.     Gerrit Friends. Dietrich Pates, MD, [facility]  Electronically Signed    RMR/MedQ  DD: 07/27/2008  DT: 07/28/2008  Job #: [id number]

## 2011-01-02 NOTE — Assessment & Plan Note (Signed)
[facility] HEALTHCARE                       [place] CARDIOLOGY OFFICE NOTE   [name], [name]                      MRN:          [id number]  DATE:12/18/2007                            DOB:          [birth year]    REFERRING PHYSICIAN:  [facility] Department.   [name] returns to the office for continued assessment and treatment  of supraventricular tachycardia.  Since her last visit, she developed an  episode of chest heaviness and was admitted to [facility].  She  was not found to be in supraventricular tachycardia during symptoms.  This was a continuation of sensations that she reported to our office in  March.  We arranged a stress nuclear study for her that was negative.  She reports some easy fatigability and perhaps dyspnea on exertion.  She  is interest in pursuing the option of radiofrequency ablation.   CURRENT MEDICATIONS:  Unchanged from her last visit, except for an  increase in Diltiazem to 360 mg daily and the addition of metoprolol 25  mg b.i.d.  She is also taking Caltrate once a day and 324 mg of aspirin.   PHYSICAL EXAMINATION:  GENERAL:  A pleasant overweight woman in no acute  distress.  VITAL SIGNS:  The weight is 205, 5 pounds less than last month.  Blood  pressure 100/70, heart rate 70 and regular, respirations 18.  NECK:  No jugular venous distention; normal carotid upstrokes without  bruits.  LUNGS:  Clear.  CARDIAC:  Normal first and second heart sounds; minimal systolic  ejection murmur.  ABDOMEN:  Soft and nontender; no organomegaly.  EXTREMITIES:  No edema; distal pulses intact.   Records from [place] were obtained and reviewed.  She had a normal  chemistry profile, glucose of 200, normal cardiac markers, normal  coagulation studies and a borderline anemia on CBC with a hemoglobin of  11.8 and a hematocrit of 35.8.  MCV was normal.  Chest x-ray was normal.  EKG was not forwarded to [place] for  review.   IMPRESSION:  [name] is doing very well with current medical  therapy; however, she may be suffering some fatigue, likely associated  with beta blocker therapy.  Under these circumstances, referral for  radiofrequency ablation is appropriate.  We will set up an appointment  with Dr. Ladona Ridgel.  For the time being, she will continue her current  medications.  I will see this nice woman again in 6 months.     Gerrit Friends. Dietrich Pates, MD, [facility]  Electronically Signed   RMR/MedQ  DD: 12/18/2007  DT: 12/18/2007  Job #: [id number]   cc:   [facility] Department

## 2011-01-02 NOTE — Assessment & Plan Note (Signed)
[name]                         ELECTROPHYSIOLOGY OFFICE NOTE   [name], [name]                      MRN:          [id number]  DATE:01/07/2008                            DOB:          15-Feb-1951    [name] is referred today by Dr. Elgin Bing for evaluation of  SVT and consideration for catheter ablation.  The patient is a very  pleasant 61 year old woman who has had a long history of tachy  palpitations and documented SVTs, at rates up to over 200 beats per  minute.  She was recently in our office back in March, and was having  more symptoms of SVT despite medical therapy.  She had documented SVT at  140 beats per minute.  In her SVT she has, what appears to be, a short  RP tachycardia with a pseudo R-prime in lead V1.  There was no evidence  of any pre-excitation on her baseline ECGs.  The patient states that she  cam often stop her episodes of SVT with vagal maneuvers.  She has very  mild chest pressure and feels very fatigued and weak after she has an  episode.  She is referred now for additional evaluation.  She does note  that when her doses of medications were increased, specifically that she  was started on beta blocker 25 mg of Toprol twice a day and her  Diltiazem was increased to 360 mg a day.  Her symptoms improved.  Unfortunately, on these higher doses of medicines, however, she feels  fatigued and weak.   Additional past medical history is notable for hypertension and obesity.  The patient also has a history of COPD and a history of non-insulin-  dependent diabetes and osteoarthritis.   FAMILY HISTORY:  Notable for asthma.  There was no premature coronary  disease.  She does note that her father has had an ablation in the past  for heart racing problems.   SOCIAL HISTORY:  The patient denies tobacco or ethanol use.   REVIEW OF SYSTEMS:  Notable for occasional dizziness and shortness of  breath with her episodes of  SVT.  Otherwise, all systems were negative  except as noted in the HPI.   PHYSICAL EXAMINATION:  She is a pleasant middle-aged woman in no acute  distress.  Blood pressure was 128/70, pulse was 56 and regular,  respirations were 18.  The weight was 209 pounds.  HEENT:  Normocephalic and atraumatic.  Pupils are equal and round.  The  oropharynx was moist.  The sclerae were anicteric.  NECK:  Revealed no jugular distention.  There were no bruits and no  thyromegaly.  Trachea was midline.  Carotids were 2+ and symmetric.  LUNGS:  Clear bilaterally to auscultation.  No wheezes, rales or rhonchi  are present.  There was no increased work of breathing.  CARDIOVASCULAR EXAM:  Revealed a regular rate and rhythm, with normal S1  and S2.  The PMI was not enlarged nor was it laterally displaced.  ABDOMINAL EXAM:  Obese, nontender and nondistended.  There was no  organomegaly.  The bowel sounds were present.  There was no rebound or  guarding.  EXTREMITIES:  Demonstrated no cyanosis, clubbing or edema.  The pulses  were 2+ and symmetric.  NEUROLOGIC EXAM:  Alert and oriented x3.  Cranial nerves were intact.  Strength was 5/5 and symmetric.   EKG:  In SVT demonstrates narrow QRS tachycardia at 140 beats per  minute.  Baseline EKG demonstrates sinus rhythm with no pre-excitation.   IMPRESSION:  1. Symptomatic recurrent sustained ventricular tachycardia.  2. Multiple medications for #1.  3. Hypertension.  4. Diabetes.   DISCUSSION:  I have discussed the treatment options with the patient,  including the risks, benefits, goals and expectations of catheter  ablation of her SVT.  She is considering her options.  She will call us  when she would like to proceed with this.     Doylene Canning. Ladona Ridgel, MD  Electronically Signed    GWT/MedQ  DD: 01/07/2008  DT: 01/07/2008  Job #: [id number]   cc:   Selinda Flavin, MD

## 2011-01-05 NOTE — H&P (Signed)
NAME:  [name], [name]               ACCOUNT NO.:  [id number]   MEDICAL RECORD NO.:  [id number]           PATIENT TYPE:  AMB   LOCATION:  DAY                           FACILITY:  [facility]   PHYSICIAN:  Dalia Heading, M.D.  DATE OF BIRTH:  1951/01/29   DATE OF ADMISSION:  DATE OF DISCHARGE:  LH                                HISTORY & PHYSICAL   CHIEF COMPLAINT:  Cholecystitis, cholelithiasis.   HISTORY OF PRESENT ILLNESS:  The patient is a 60 year old white female who  is referred for evaluation and treatment of biliary colic secondary to  cholelithiasis.  She had an episode of right upper quadrant abdominal pain  with radiation to the right flank, nausea, and bloating last night.  She was  seen in the emergency room and found on a CT scan of the abdomen to have  cholelithiasis.  This was her first episode.  No fever, chills, or jaundice  had been noted.   PAST MEDICAL HISTORY:  Includes:  1.  Extrinsic allergies.  2.  COPD.  3.  Depression.  4.  Reflux disease.   PAST SURGICAL HISTORY:  Tubal ligation.   CURRENT MEDICATIONS:  1.  Advair.  2.  Singulair.  3.  Flonase.  4.  Combivent.  5.  Albuterol.  6.  Prozac.  7.  Vistaril.  8.  Glucophage.  9.  Nexium.   ALLERGIES:  NO KNOWN DRUG ALLERGIES.   REVIEW OF SYSTEMS:  Non-contributory.   PHYSICAL EXAMINATION:  GENERAL:  The patient is a well-developed well-  nourished white female in no acute distress.  HEENT:  Reveals no scleral icterus.  LUNGS:  Clear to auscultation with equal breath sounds bilaterally.  No  expiratory wheezing is noted on examination.  HEART:  Reveals a regular rate and rhythm without S3, S4, or murmurs.  ABDOMEN:  Soft and non-distended.  She is tender in the right upper quadrant  to palpation.  No hepatosplenomegaly, masses, are hernias are identified.   IMPRESSION:  Cholecystitis, cholelithiasis.   PLAN:  The patient is scheduled for a laparoscopic cholecystectomy on  August 10, 2005.  The  risks and benefits of the procedure including  bleeding, infection, hepatobiliary injury, and the possibility of an open  procedure were fully explained to the patient, who gave an informed consent.      Dalia Heading, M.D.  Electronically Signed     MAJ/MEDQ  D:  07/26/2005  T:  07/26/2005  Job:  [id number]   cc:   [facility]  Fax: [phone]

## 2011-01-05 NOTE — Procedures (Signed)
NAME:  [name], [name]                         ACCOUNT NO.:  [id number]   MEDICAL RECORD NO.:  [id number]                   PATIENT TYPE:  OUT   LOCATION:  RESP                                 FACILITY:  [facility]   PHYSICIAN:  Edward L. Juanetta Gosling, M.D.             DATE OF BIRTH:  06-Feb-1951   DATE OF PROCEDURE:  01/25/2004  DATE OF DISCHARGE:                              PULMONARY FUNCTION TEST   1. Spirometry does not show a definite ventilatory defect but does show     evidence of air flow obstruction.  2. Lung volumes are normal.  3. DLCO is mildly reduced.  4. Arterial blood gases are normal.       ___________________________________________                                            Oneal Deputy. Juanetta Gosling, M.D.   ELH/MEDQ  D:  01/25/2004  T:  01/25/2004  Job:  [id number]   cc:   [facility]

## 2011-01-05 NOTE — Op Note (Signed)
[name], [name] NO.:  [id number]   MEDICAL RECORD NO.:  [id number]          PATIENT TYPE:  AMB   LOCATION:  DAY                           FACILITY:  [facility]   PHYSICIAN:  Dalia Heading, M.D.  DATE OF BIRTH:  1950-12-23   DATE OF PROCEDURE:  09/05/2005  DATE OF DISCHARGE:                                 OPERATIVE REPORT   PREOPERATIVE DIAGNOSIS:  Cholecystitis, cholelithiasis.   POSTOPERATIVE DIAGNOSIS:  Cholecystitis, cholelithiasis.   PROCEDURE:  Laparoscopic cholecystectomy.   SURGEON:  Dr. Franky Macho.   ASSISTANT:  Dr. Arna Snipe.   ANESTHESIA:  General endotracheal.   INDICATIONS:  The patient is a 60 year old white female who presents with  cholecystitis secondary to cholelithiasis. Risks and benefits of the  procedure including bleeding, infection, hepatobiliary injury and the  possibility of an open procedure were fully explained to the patient, who  gave informed consent.   PROCEDURE NOTE:  The patient was placed in the supine position. After  induction of general endotracheal anesthesia, the abdomen was prepped and  draped using the usual sterile technique with Betadine. Surgical site  confirmation was performed.   A supraumbilical incision was made down to the fascia. Veress needle was  introduced into the abdominal cavity, and confirmation of placement was done  using the saline drop test. The abdomen was then insufflated to 16 mmHg  pressure. An 11-mm trocar was introduced into the abdominal cavity under  direct visual visualization without difficulty. The patient was placed in  reversed Trendelenburg position. An additional 11-mm trocar was placed  epigastric region and 5-mm trocars were placed in the right upper quadrant  and right flank regions. Liver was inspected and noted to normal limits. The  gallbladder was retracted superiorly and laterally. The dissection was begun  around the infundibulum of the gallbladder. The cystic  duct was first  identified. Its juncture to the infundibulum fully identified. Endoclips  were placed proximally and distally on the cystic duct, and the cystic duct  was divided. This was likewise done to the cystic artery. The gallbladder  was then freed away from the gallbladder fossa using Bovie electrocautery.  The gallbladder was delivered through the epigastric trocar site using an  EndoCatch bag. The gallbladder fossa was inspected. No abnormal bleeding or  bile leakage was noted. Surgicel was placed in the gallbladder fossa. All  fluid and air were then evacuated from the abdominal cavity prior to removal  of the trocars.   All wounds were irrigated with normal saline. All wounds were injected with  0.5% Sensorcaine. The supraumbilical fascia was reapproximated using a 0  Vicryl interrupted suture. All skin incisions were closed using staples.  Betadine ointment and dry sterile dressings were applied.   All tape and needle counts were correct at the end of the procedure. The  patient was extubated in the operating room and went back to recovery room  awake in stable condition.   COMPLICATIONS:  None.   SPECIMEN:  Gallbladder with stone.   BLOOD LOSS:  Minimal.      Dalia Heading, M.D.  Electronically Signed     MAJ/MEDQ  D:  09/05/2005  T:  09/05/2005  Job:  [id number]

## 2011-01-30 ENCOUNTER — Encounter: Payer: Self-pay | Admitting: Internal Medicine

## 2011-02-07 ENCOUNTER — Other Ambulatory Visit: Payer: Self-pay | Admitting: *Deleted

## 2011-02-07 MED ORDER — DILTIAZEM HCL ER BEADS 360 MG PO CP24: 360.0000 mg | ORAL_CAPSULE | Freq: Every day | ORAL | Status: DC

## 2011-02-07 MED ORDER — METOPROLOL TARTRATE 25 MG PO TABS: 25.0000 mg | ORAL_TABLET | Freq: Two times a day (BID) | ORAL | Status: DC

## 2011-02-16 ENCOUNTER — Ambulatory Visit: Payer: Self-pay | Admitting: Internal Medicine

## 2011-02-27 ENCOUNTER — Encounter: Payer: Self-pay | Admitting: Internal Medicine

## 2011-03-02 ENCOUNTER — Ambulatory Visit (INDEPENDENT_AMBULATORY_CARE_PROVIDER_SITE_OTHER): Payer: Medicare Other | Admitting: Internal Medicine

## 2011-03-02 ENCOUNTER — Encounter: Payer: Self-pay | Admitting: Internal Medicine

## 2011-03-02 DIAGNOSIS — I471 Supraventricular tachycardia: Secondary | ICD-10-CM

## 2011-03-02 DIAGNOSIS — E785 Hyperlipidemia, unspecified: Secondary | ICD-10-CM | POA: Insufficient documentation

## 2011-03-02 NOTE — Assessment & Plan Note (Signed)
No inducible arrhythmias at EP study 2011.  She is presently doing very well. No changes at this time.

## 2011-03-02 NOTE — Progress Notes (Signed)
The patient presents today for routine electrophysiology followup.  Since last being seen in our clinic, the patient reports doing very well.  She has occasional palpitations but no sustained arrhythmias.  She reports occasional orthostatic dizziness.  Today, she denies symptoms of  chest pain, shortness of breath, orthopnea, PND, lower extremity edema, presyncope, syncope, or neurologic sequela.  The patient feels that she is tolerating medications without difficulties and is otherwise without complaint today.   Past Medical History  Diagnosis Date  . SVT (supraventricular tachycardia)     no inducible arrhythmias by EP study 11/11  . Osteoarthritis     hands and knees  . Asthma   . DM (diabetes mellitus)     AO. no insulin; hemoglobin A1c of 8.4 in 2010.   Marland Kitchen GERD (gastroesophageal reflux disease)   . Depression   . HTN (hypertension)   . Hyperlipidemia     suboptimal profile in 9/10  . Lichen sclerosus    Past Surgical History  Procedure Date  . Cholecystectomy 2007  . Bilateral tubal ligation 1978  . Ep study 11/11    no inducible arrhythmias, no ablation performed    Current Outpatient Prescriptions  Medication Sig Dispense Refill  . albuterol (PROVENTIL HFA) 108 (90 BASE) MCG/ACT inhaler Inhale 2 puffs into the lungs every 6 (six) hours as needed.        Marland Kitchen aspirin 81 MG tablet Take 81 mg by mouth daily.        . Cinnamon 500 MG capsule Take 500 mg by mouth 2 (two) times daily.        . clobetasol (TEMOVATE) 0.05 % cream Apply 1 application topically 2 (two) times daily.        Marland Kitchen diltiazem (TIAZAC) 360 MG 24 hr capsule Take 1 capsule (360 mg total) by mouth daily.  90 capsule  0  . fluticasone (FLONASE) 50 MCG/ACT nasal spray Place 1 spray into the nose 2 (two) times daily.        . Fluticasone-Salmeterol (ADVAIR DISKUS) 500-50 MCG/DOSE AEPB Inhale 1 puff into the lungs daily.        Marland Kitchen glipiZIDE (GLUCOTROL) 10 MG tablet Take 10 mg by mouth 2 (two) times daily.        .  hydrOXYzine (VISTARIL) 25 MG capsule Take 25 mg by mouth as needed.        . metFORMIN (GLUCOPHAGE) 850 MG tablet Take 850 mg by mouth 3 (three) times daily.        . metoprolol tartrate (LOPRESSOR) 25 MG tablet Take 1 tablet (25 mg total) by mouth 2 (two) times daily. PATIENT NEED OFFICE VISIT WITH PRIMARY CARDIOLOGIST  180 tablet  0  . Multiple Vitamin (MULTIVITAMIN) tablet Take 1 tablet by mouth daily.        . Omega-3 Fatty Acids (FISH OIL) 1000 MG CAPS Take 3 capsules by mouth daily.        Marland Kitchen omeprazole (PRILOSEC) 20 MG capsule Take 20 mg by mouth every other day.       . saxagliptin HCl (ONGLYZA) 5 MG TABS tablet Take 5 mg by mouth daily.        . simvastatin (ZOCOR) 40 MG tablet Take 20 mg by mouth daily.         No Known Allergies  History   Social History  . Marital Status: Married    Spouse Name: N/A    Number of Children: N/A  . Years of Education: N/A   Occupational History  .  Not on file.   Social History Main Topics  . Smoking status: Former Games developer  . Smokeless tobacco: Not on file   Comment: quit in 1980's  . Alcohol Use: Yes     no excessive use  . Drug Use: Not on file  . Sexually Active: Not on file   Other Topics Concern  . Not on file   Social History Narrative   Married, 3 children. Lives in [place], unemployed.     Family History  Problem Relation Age of Onset  . Liver disease Neg Hx     also Neg hx of CRC   Physical Exam: Filed Vitals:   03/02/11 1448  BP: 135/78  Pulse: 61  Resp: 18  Height: 5\' 3"  (1.6 m)  Weight: 201 lb (91.173 kg)  SpO2: 97%    GEN- The patient is overweight appearing, alert and oriented x 3 today.   Head- normocephalic, atraumatic Eyes-  Sclera clear, conjunctiva pink Ears- hearing intact Oropharynx- clear Neck- supple, no JVP Lymph- no cervical lymphadenopathy Lungs- Clear to ausculation bilaterally, normal work of breathing Heart- Regular rate and rhythm, no murmurs, rubs or gallops, PMI not laterally  displaced GI- soft, NT, ND, + BS Extremities- no clubbing, cyanosis, or edema MS- no significant deformity or atrophy Skin- no rash or lesion Psych- euthymic mood, full affect Neuro- strength and sensation are intact  Assessment and Plan:

## 2011-03-02 NOTE — Assessment & Plan Note (Signed)
Stable No change required today  

## 2011-03-29 ENCOUNTER — Other Ambulatory Visit: Payer: Self-pay | Admitting: *Deleted

## 2011-03-29 MED ORDER — DILTIAZEM HCL ER BEADS 360 MG PO CP24: 360.0000 mg | ORAL_CAPSULE | Freq: Every day | ORAL | Status: DC

## 2011-03-29 MED ORDER — METOPROLOL TARTRATE 25 MG PO TABS: 25.0000 mg | ORAL_TABLET | Freq: Two times a day (BID) | ORAL | Status: DC

## 2011-07-07 ENCOUNTER — Encounter (HOSPITAL_COMMUNITY): Payer: Self-pay

## 2011-07-07 ENCOUNTER — Emergency Department (HOSPITAL_COMMUNITY)
Admission: EM | Admit: 2011-07-07 | Discharge: 2011-07-07 | Disposition: A | Discharge status: Home / Self Care | Payer: Medicare Other | Attending: Emergency Medicine | Admitting: Emergency Medicine

## 2011-07-07 DIAGNOSIS — S60459A Superficial foreign body of unspecified finger, initial encounter: Secondary | ICD-10-CM | POA: Insufficient documentation

## 2011-07-07 DIAGNOSIS — W268XXA Contact with other sharp object(s), not elsewhere classified, initial encounter: Secondary | ICD-10-CM | POA: Insufficient documentation

## 2011-07-07 MED ORDER — BUPIVACAINE HCL (PF) 0.5 % IJ SOLN
10.0000 mL | Freq: Once | INTRAMUSCULAR | Status: AC
  Administered 2011-07-07: 10 mL
  Filled 2011-07-07: qty 30

## 2011-07-07 MED ORDER — TETANUS-DIPHTH-ACELL PERTUSSIS 5-2.5-18.5 LF-MCG/0.5 IM SUSP
0.5000 mL | Freq: Once | INTRAMUSCULAR | Status: AC
  Administered 2011-07-07: 0.5 mL via INTRAMUSCULAR
  Filled 2011-07-07: qty 0.5

## 2011-07-07 MED ORDER — CEPHALEXIN 500 MG PO CAPS: 500.0000 mg | ORAL_CAPSULE | Freq: Four times a day (QID) | ORAL | Status: AC

## 2011-07-07 NOTE — ED Notes (Signed)
Pt a/ox4. resp even and unlabored. NAD at this time. D/C instructions and Rx x1 reviewed with pt. Pt verbalized understanding. Pt ambulated to POV with steady gate.  

## 2011-07-07 NOTE — ED Provider Notes (Signed)
Medical screening examination/treatment/procedure(s) were performed by non-physician practitioner and as supervising physician I was immediately available for consultation/collaboration.  Nashalie Sallis, MD 07/07/11 1933 

## 2011-07-07 NOTE — ED Provider Notes (Signed)
History     CSN: [id number] Arrival date & time: 07/07/2011  3:09 PM   First MD Initiated Contact with Patient 07/07/11 1613      No chief complaint on file.   (Consider location/radiation/quality/duration/timing/severity/associated sxs/prior treatment) HPI Comments: Pt was cleaning at her mom's house.  She reached out to grab something and a splinter from the wall paneling went up under her R 4th fingernail.  The history is provided by the patient. No language interpreter was used.    Past Medical History  Diagnosis Date  . SVT (supraventricular tachycardia)     no inducible arrhythmias by EP study 11/11  . Osteoarthritis     hands and knees  . Asthma   . DM (diabetes mellitus)     AO. no insulin; hemoglobin A1c of 8.4 in 2010.   Marland Kitchen GERD (gastroesophageal reflux disease)   . Depression   . HTN (hypertension)   . Hyperlipidemia     suboptimal profile in 9/10  . Lichen sclerosus     Past Surgical History  Procedure Date  . Cholecystectomy 2007  . Bilateral tubal ligation 1978  . Ep study 11/11    no inducible arrhythmias, no ablation performed    Family History  Problem Relation Age of Onset  . Liver disease Neg Hx     also Neg hx of CRC    History  Substance Use Topics  . Smoking status: Former Games developer  . Smokeless tobacco: Not on file   Comment: quit in 1980's  . Alcohol Use: Yes     no excessive use    OB History    Grav Para Term Preterm Abortions TAB SAB Ect Mult Living                  Review of Systems  Musculoskeletal:       Trauma  All other systems reviewed and are negative.    Allergies  Review of patient's allergies indicates no known allergies.  Home Medications   Current Outpatient Rx  Name Route Sig Dispense Refill  . ALBUTEROL SULFATE HFA 108 (90 BASE) MCG/ACT IN AERS Inhalation Inhale 2 puffs into the lungs every 6 (six) hours as needed.      . ASPIRIN 81 MG PO TABS Oral Take 81 mg by mouth daily.      Marland Kitchen CINNAMON 500 MG PO  CAPS Oral Take 500 mg by mouth 2 (two) times daily.      Marland Kitchen CLOBETASOL PROPIONATE 0.05 % EX CREA Topical Apply 1 application topically 2 (two) times daily.      Marland Kitchen DILTIAZEM HCL ER BEADS 360 MG PO CP24 Oral Take 1 capsule (360 mg total) by mouth daily. 90 capsule 3  . FLUTICASONE PROPIONATE 50 MCG/ACT NA SUSP Nasal Place 1 spray into the nose 2 (two) times daily.      Marland Kitchen FLUTICASONE-SALMETEROL 500-50 MCG/DOSE IN AEPB Inhalation Inhale 1 puff into the lungs daily.      Marland Kitchen GLIPIZIDE 10 MG PO TABS Oral Take 10 mg by mouth 2 (two) times daily.      Marland Kitchen HYDROXYZINE PAMOATE 25 MG PO CAPS Oral Take 25 mg by mouth as needed.      Marland Kitchen METFORMIN HCL 850 MG PO TABS Oral Take 850 mg by mouth 3 (three) times daily.      Marland Kitchen METOPROLOL TARTRATE 25 MG PO TABS Oral Take 1 tablet (25 mg total) by mouth 2 (two) times daily. 180 tablet 3  . ONE-DAILY MULTI VITAMINS  PO TABS Oral Take 1 tablet by mouth daily.      Marland Kitchen FISH OIL 1000 MG PO CAPS Oral Take 3 capsules by mouth daily.      Marland Kitchen OMEPRAZOLE 20 MG PO CPDR Oral Take 20 mg by mouth every other day.     Marland Kitchen SAXAGLIPTIN HCL 5 MG PO TABS Oral Take 5 mg by mouth daily.      Marland Kitchen SIMVASTATIN 40 MG PO TABS Oral Take 20 mg by mouth daily.       BP 133/65  Pulse 68  Temp(Src) 98.5 F (36.9 C) (Oral)  Resp 20  Ht 5\' 2"  (1.575 m)  Wt 199 lb (90.266 kg)  BMI 36.40 kg/m2  SpO2 98%  Physical Exam  ED Course  FOREIGN BODY REMOVAL Date/Time: 07/07/2011 5:05 PM Performed by: Worthy Rancher Authorized by: Worthy Rancher Consent: Verbal consent obtained. Written consent not obtained. Risks and benefits: risks, benefits and alternatives were discussed Consent given by: patient Patient understanding: patient states understanding of the procedure being performed Imaging studies: imaging studies not available Required items: required blood products, implants, devices, and special equipment available Patient identity confirmed: verbally with patient Intake:  fingernail. Anesthesia: nerve block Local anesthetic: bupivacaine 0.5% without epinephrine Anesthetic total: 5 ml Patient sedated: no Patient restrained: no Patient cooperative: yes Complexity: simple 1 objects recovered. Objects recovered: wooden splinter 1.5 cm long Post-procedure assessment: foreign body removed Patient tolerance: Patient tolerated the procedure well with no immediate complications. Comments: Using iris scissors i undermined nail on either side of the splinter.  Using alligator forceps the splinter was grabbed and removed whole.   (including critical care time)  Labs Reviewed - No data to display No results found.   No diagnosis found.    MDM          Worthy Rancher, PA 07/07/11 1723

## 2011-07-07 NOTE — ED Notes (Signed)
Pt has splinter under left ring finger.

## 2011-07-19 NOTE — Telephone Encounter (Signed)
Can you please close this encounter

## 2011-08-01 ENCOUNTER — Telehealth (HOSPITAL_COMMUNITY): Payer: Self-pay | Admitting: Dietician

## 2011-08-24 NOTE — Telephone Encounter (Signed)
Sent letter on 08/16/11 to pt home via [place] mail in attempt to contact pt re: rescheduling appointment. Appointment scheduled for 08/27/10 at 2 PM.

## 2011-08-28 ENCOUNTER — Encounter (HOSPITAL_COMMUNITY): Payer: Self-pay | Admitting: Dietician

## 2011-08-28 NOTE — Progress Notes (Signed)
Follow-Up Outpatient Nutrition Note Date: 08/28/2011  Time: 2:00 PM  Nutrition Assessment:  Current weight: 196# BMI: 35.82  Weight changes: -4# (2%) x 2.5 months  [name] continues to struggle with her diabetes. She is happy that she lost 4# in spite of the holidays; she reports that she overate with all the snacks and dinner functions at church. She reports that she had a low key Christmas meal at home with cold cut tray, condiments, and 1 dessert. She is upset about her latest Hgb A1c reading from 07/25/11, which went up 0.1 points (reported level: 7.6). Pt reports that she is getting good readings on her meter. She has been eating a lot of leftover vegetables. She also reports a lot of stress, due to family members, particularly her father, who was recently in the ER last month. She reports that she was prescribed another diabetes medication, but hasn't been gotten it filled due to lack of financial resources. She does not engage in regular physical activity; she walks around the grocery store when she goes shopping. She has not utilized her Silver Sneakers benefit. She inquired about the cooking classes at [facility]. She reports that she is looking for new ways to cook food with her limited resources.   Nutrition Diagnosis: Excessive carbohydrate intake continues  Nutrition Intervention: Education/ counseling provided: Counseled pt on importance of healthy diet and encouraged pt to try new foods. Emphasized importance of regular physical activity to optimize glycemic control. Suggested going to the [facility] or walking around the [employer]. Provided information on upcoming cooking class on 09/12/11 and encouraged attendance.   Understanding/Motivation/ Ability to follow recommendations: Expect fair to good compliance.  Monitoring and Evaluation: Previous goals: 1) 1-2# wt loss per week- goal not met, but progressing; 2) 30 minutes physical activity daily- goal not met; 3) 1 starch per meal- goal not  met  Goals for next visit: 1) 1-2# wt loss per week; 2) 30 minutes physical activity daily  Recommendations: 1) Keep food diary; 2) Incorporate physical activity into daily routine; 3) Attend cooking class  F/U: 2 months  [name] A. Kayan, RD, LDN Date:08/28/2011 Time: 2:00 PM

## 2011-10-04 ENCOUNTER — Encounter (HOSPITAL_COMMUNITY): Payer: Self-pay

## 2011-10-04 ENCOUNTER — Emergency Department (HOSPITAL_COMMUNITY)
Admission: EM | Admit: 2011-10-04 | Discharge: 2011-10-04 | Disposition: A | Discharge status: Home / Self Care | Payer: Medicare Other | Attending: Emergency Medicine | Admitting: Emergency Medicine

## 2011-10-04 ENCOUNTER — Emergency Department (HOSPITAL_COMMUNITY): Payer: Medicare Other

## 2011-10-04 DIAGNOSIS — R509 Fever, unspecified: Secondary | ICD-10-CM | POA: Insufficient documentation

## 2011-10-04 DIAGNOSIS — J45909 Unspecified asthma, uncomplicated: Secondary | ICD-10-CM | POA: Insufficient documentation

## 2011-10-04 DIAGNOSIS — Z9889 Other specified postprocedural states: Secondary | ICD-10-CM | POA: Insufficient documentation

## 2011-10-04 DIAGNOSIS — I10 Essential (primary) hypertension: Secondary | ICD-10-CM | POA: Insufficient documentation

## 2011-10-04 DIAGNOSIS — E119 Type 2 diabetes mellitus without complications: Secondary | ICD-10-CM | POA: Insufficient documentation

## 2011-10-04 DIAGNOSIS — E785 Hyperlipidemia, unspecified: Secondary | ICD-10-CM | POA: Insufficient documentation

## 2011-10-04 DIAGNOSIS — Z7982 Long term (current) use of aspirin: Secondary | ICD-10-CM | POA: Insufficient documentation

## 2011-10-04 DIAGNOSIS — K219 Gastro-esophageal reflux disease without esophagitis: Secondary | ICD-10-CM | POA: Insufficient documentation

## 2011-10-04 DIAGNOSIS — R059 Cough, unspecified: Secondary | ICD-10-CM | POA: Insufficient documentation

## 2011-10-04 DIAGNOSIS — J209 Acute bronchitis, unspecified: Secondary | ICD-10-CM

## 2011-10-04 DIAGNOSIS — Z9851 Tubal ligation status: Secondary | ICD-10-CM | POA: Insufficient documentation

## 2011-10-04 DIAGNOSIS — R05 Cough: Secondary | ICD-10-CM | POA: Insufficient documentation

## 2011-10-04 DIAGNOSIS — Z87891 Personal history of nicotine dependence: Secondary | ICD-10-CM | POA: Insufficient documentation

## 2011-10-04 MED ORDER — ALBUTEROL SULFATE HFA 108 (90 BASE) MCG/ACT IN AERS
2.0000 | INHALATION_SPRAY | RESPIRATORY_TRACT | Status: DC | PRN
  Administered 2011-10-04: 2 via RESPIRATORY_TRACT
  Filled 2011-10-04: qty 6.7

## 2011-10-04 MED ORDER — AZITHROMYCIN 250 MG PO TABS: 250.0000 mg | ORAL_TABLET | Freq: Every day | ORAL | Status: AC

## 2011-10-04 MED ORDER — AZITHROMYCIN 250 MG PO TABS
500.0000 mg | ORAL_TABLET | Freq: Once | ORAL | Status: AC
  Administered 2011-10-04: 500 mg via ORAL
  Filled 2011-10-04: qty 2

## 2011-10-04 MED ORDER — ALBUTEROL SULFATE (5 MG/ML) 0.5% IN NEBU
2.5000 mg | INHALATION_SOLUTION | Freq: Once | RESPIRATORY_TRACT | Status: AC
  Administered 2011-10-04: 2.5 mg via RESPIRATORY_TRACT
  Filled 2011-10-04: qty 0.5

## 2011-10-04 NOTE — ED Provider Notes (Addendum)
History     CSN: [id number]  Arrival date & time 10/04/11  0041   First MD Initiated Contact with Patient 10/04/11 0135      Chief Complaint  Patient presents with  . Cough  . URI  . Fever    (Consider location/radiation/quality/duration/timing/severity/associated sxs/prior treatment) Patient is a 61 y.o. female presenting with cough. The history is provided by the patient.  Cough This is a new problem. Episode onset: 5 days ago. The problem has been gradually worsening. The cough is productive of purulent sputum. The maximum temperature recorded prior to her arrival was 100 to 100.9 F. Pertinent negatives include no chest pain. She has tried decongestants for the symptoms. The treatment provided no relief. She is not a smoker (lives with a smoker). Past medical history comments: DM.    Past Medical History  Diagnosis Date  . SVT (supraventricular tachycardia)     no inducible arrhythmias by EP study 11/11  . Osteoarthritis     hands and knees  . Asthma   . DM (diabetes mellitus)     AO. no insulin; hemoglobin A1c of 8.4 in 2010.   Marland Kitchen GERD (gastroesophageal reflux disease)   . Depression   . HTN (hypertension)   . Hyperlipidemia     suboptimal profile in 9/10  . Lichen sclerosus     Past Surgical History  Procedure Date  . Cholecystectomy 2007  . Bilateral tubal ligation 1978  . Ep study 11/11    no inducible arrhythmias, no ablation performed    Family History  Problem Relation Age of Onset  . Liver disease Neg Hx     also Neg hx of CRC    History  Substance Use Topics  . Smoking status: Former Games developer  . Smokeless tobacco: Not on file   Comment: quit in 1980's  . Alcohol Use: Yes     no excessive use    OB History    Grav Para Term Preterm Abortions TAB SAB Ect Mult Living                  Review of Systems  Cardiovascular: Negative for chest pain.    Allergies  Review of patient's allergies indicates no known allergies.  Home Medications    Current Outpatient Rx  Name Route Sig Dispense Refill  . ASPIRIN 81 MG PO TABS Oral Take 81 mg by mouth daily.      Marland Kitchen CLOBETASOL PROPIONATE 0.05 % EX CREA Topical Apply 1 application topically 2 (two) times daily.      Marland Kitchen DILTIAZEM HCL ER BEADS 360 MG PO CP24 Oral Take 1 capsule (360 mg total) by mouth daily. 90 capsule 3  . GLIPIZIDE 10 MG PO TABS Oral Take 10 mg by mouth 2 (two) times daily.      Marland Kitchen METFORMIN HCL 850 MG PO TABS Oral Take 850 mg by mouth 3 (three) times daily.      Marland Kitchen METOPROLOL TARTRATE 25 MG PO TABS Oral Take 1 tablet (25 mg total) by mouth 2 (two) times daily. 180 tablet 3  . ONE-DAILY MULTI VITAMINS PO TABS Oral Take 1 tablet by mouth daily.      Marland Kitchen FISH OIL 1000 MG PO CAPS Oral Take 3 capsules by mouth daily.      Marland Kitchen OMEPRAZOLE 20 MG PO CPDR Oral Take 20 mg by mouth every other day.     Marland Kitchen SIMVASTATIN 40 MG PO TABS Oral Take 20 mg by mouth daily.     Marland Kitchen  ALBUTEROL SULFATE HFA 108 (90 BASE) MCG/ACT IN AERS Inhalation Inhale 2 puffs into the lungs every 6 (six) hours as needed.      Marland Kitchen CINNAMON 500 MG PO CAPS Oral Take 500 mg by mouth 2 (two) times daily.      Marland Kitchen FLUTICASONE PROPIONATE 50 MCG/ACT NA SUSP Nasal Place 1 spray into the nose 2 (two) times daily.      Marland Kitchen FLUTICASONE-SALMETEROL 500-50 MCG/DOSE IN AEPB Inhalation Inhale 1 puff into the lungs daily.      Marland Kitchen HYDROXYZINE PAMOATE 25 MG PO CAPS Oral Take 25 mg by mouth as needed.      Marland Kitchen SAXAGLIPTIN HCL 5 MG PO TABS Oral Take 5 mg by mouth daily.        BP 100/54  Pulse 99  Temp(Src) 99 F (37.2 C) (Oral)  Resp 24  Ht 5\' 2"  (1.575 m)  Wt 197 lb (89.359 kg)  BMI 36.03 kg/m2  SpO2 95%  Physical Exam  Nursing note and vitals reviewed. Constitutional: She is oriented to person, place, and time. She appears well-developed and well-nourished. No distress.  HENT:  Head: Normocephalic and atraumatic.  Neck: Normal range of motion. Neck supple.  Cardiovascular: Normal rate and regular rhythm.  Exam reveals no gallop and no  friction rub.   No murmur heard. Pulmonary/Chest: Effort normal and breath sounds normal. No respiratory distress. She has no wheezes.  Abdominal: Soft. Bowel sounds are normal. She exhibits no distension. There is no tenderness.  Musculoskeletal: Normal range of motion.  Neurological: She is alert and oriented to person, place, and time.  Skin: Skin is warm and dry. She is not diaphoretic.    ED Course  Procedures (including critical care time)  Labs Reviewed - No data to display No results found.   No diagnosis found.    MDM  Xray looks okay.  As patient is diabetic, I will prescribe zmax and an inhaler for probable bronchitis.  To return prn if worsens.        Geoffery Lyons, MD 10/04/11 1610  Geoffery Lyons, MD 10/24/11 [phone]

## 2011-10-04 NOTE — Discharge Instructions (Signed)

## 2011-10-04 NOTE — ED Notes (Signed)
Started on Sunday with a cough, cold symptoms, then running a low grade fever. Not getting better per pt. Sinuses have been congested; but now they have cleared and its in my chest per pt.

## 2011-10-23 ENCOUNTER — Telehealth (HOSPITAL_COMMUNITY): Payer: Self-pay | Admitting: Dietician

## 2011-10-23 NOTE — Telephone Encounter (Signed)
Received voicemail left from pt on 10/23/11 at 10:47. Pt reports that she needs to cancel appointment today (10/23/11 at 2:00 PM), due to father's transfer to assisted living. Pt reports she will call back to reschedule.

## 2011-11-01 NOTE — Telephone Encounter (Signed)
Sent letter to pt home via [place] Mail in attempt to contact pt to reschedule appointment.  

## 2011-11-08 NOTE — Telephone Encounter (Signed)
Pt has not responded to contact attempts. Referral filed.

## 2012-01-15 ENCOUNTER — Emergency Department (HOSPITAL_COMMUNITY)
Admission: EM | Admit: 2012-01-15 | Discharge: 2012-01-16 | Disposition: A | Discharge status: Home / Self Care | Payer: Medicare Other | Attending: Emergency Medicine | Admitting: Emergency Medicine

## 2012-01-15 ENCOUNTER — Emergency Department (HOSPITAL_COMMUNITY): Payer: Medicare Other

## 2012-01-15 ENCOUNTER — Encounter (HOSPITAL_COMMUNITY): Payer: Self-pay | Admitting: *Deleted

## 2012-01-15 DIAGNOSIS — E785 Hyperlipidemia, unspecified: Secondary | ICD-10-CM | POA: Insufficient documentation

## 2012-01-15 DIAGNOSIS — F3289 Other specified depressive episodes: Secondary | ICD-10-CM | POA: Insufficient documentation

## 2012-01-15 DIAGNOSIS — K219 Gastro-esophageal reflux disease without esophagitis: Secondary | ICD-10-CM | POA: Insufficient documentation

## 2012-01-15 DIAGNOSIS — R093 Abnormal sputum: Secondary | ICD-10-CM | POA: Insufficient documentation

## 2012-01-15 DIAGNOSIS — R509 Fever, unspecified: Secondary | ICD-10-CM | POA: Insufficient documentation

## 2012-01-15 DIAGNOSIS — F329 Major depressive disorder, single episode, unspecified: Secondary | ICD-10-CM | POA: Insufficient documentation

## 2012-01-15 DIAGNOSIS — R05 Cough: Secondary | ICD-10-CM | POA: Insufficient documentation

## 2012-01-15 DIAGNOSIS — R059 Cough, unspecified: Secondary | ICD-10-CM | POA: Insufficient documentation

## 2012-01-15 DIAGNOSIS — J45909 Unspecified asthma, uncomplicated: Secondary | ICD-10-CM | POA: Insufficient documentation

## 2012-01-15 DIAGNOSIS — M1990 Unspecified osteoarthritis, unspecified site: Secondary | ICD-10-CM | POA: Insufficient documentation

## 2012-01-15 DIAGNOSIS — E119 Type 2 diabetes mellitus without complications: Secondary | ICD-10-CM | POA: Insufficient documentation

## 2012-01-15 DIAGNOSIS — I10 Essential (primary) hypertension: Secondary | ICD-10-CM | POA: Insufficient documentation

## 2012-01-15 NOTE — ED Notes (Addendum)
Received pt. From triage, pt. Alert and oriented, ambulatory, gait steady, respirations even and regular, pt. Placed on cardiac monitor

## 2012-01-15 NOTE — ED Provider Notes (Signed)
History     CSN: [id number]  Arrival date & time 01/15/12  2133   None     Chief Complaint  Patient presents with  . Cough    (Consider location/radiation/quality/duration/timing/severity/associated sxs/prior treatment) HPI Comments: Pt's husband has had cough and fever x 4 days.  Pt's sxs began yest.  Cough prod of yellow and green sputum.  Patient is a 61 y.o. female presenting with cough. The history is provided by the patient. No language interpreter was used.  Cough The current episode started yesterday. The problem occurs every few minutes. The cough is productive of purulent sputum. There has been no fever. Pertinent negatives include no chills, no shortness of breath and no wheezing. She has tried nothing for the symptoms. She is not a smoker. Her past medical history is significant for asthma.    Past Medical History  Diagnosis Date  . SVT (supraventricular tachycardia)     no inducible arrhythmias by EP study 11/11  . Osteoarthritis     hands and knees  . Asthma   . DM (diabetes mellitus)     AO. no insulin; hemoglobin A1c of 8.4 in 2010.   Marland Kitchen GERD (gastroesophageal reflux disease)   . Depression   . HTN (hypertension)   . Hyperlipidemia     suboptimal profile in 9/10  . Lichen sclerosus     Past Surgical History  Procedure Date  . Cholecystectomy 2007  . Bilateral tubal ligation 1978  . Ep study 11/11    no inducible arrhythmias, no ablation performed    Family History  Problem Relation Age of Onset  . Liver disease Neg Hx     also Neg hx of CRC    History  Substance Use Topics  . Smoking status: Former Games developer  . Smokeless tobacco: Not on file   Comment: quit in 1980's  . Alcohol Use: No     no excessive use    OB History    Grav Para Term Preterm Abortions TAB SAB Ect Mult Living                  Review of Systems  Constitutional: Negative for fever and chills.  Respiratory: Positive for cough. Negative for shortness of breath, wheezing  and stridor.   All other systems reviewed and are negative.    Allergies  Review of patient's allergies indicates no known allergies.  Home Medications     BP 125/67  Pulse 64  Temp(Src) 98.8 F (37.1 C) (Oral)  Resp 17  Ht 5\' 4"  (1.626 m)  Wt 196 lb (88.905 kg)  BMI 33.64 kg/m2  SpO2 95%  Physical Exam  Nursing note and vitals reviewed. Constitutional: She is oriented to person, place, and time. She appears well-developed and well-nourished. No distress.  HENT:  Head: Normocephalic and atraumatic.  Eyes: EOM are normal.  Neck: Normal range of motion.  Cardiovascular: Normal rate, regular rhythm and normal heart sounds.   Pulmonary/Chest: Effort normal and breath sounds normal. No accessory muscle usage. Not tachypneic. No respiratory distress. She has no decreased breath sounds. She has no wheezes. She has no rhonchi. She has no rales.  Abdominal: Soft. She exhibits no distension. There is no tenderness.  Musculoskeletal: Normal range of motion.  Neurological: She is alert and oriented to person, place, and time.  Skin: Skin is warm and dry.  Psychiatric: She has a normal mood and affect. Judgment normal.    ED Course  Procedures (including critical care time)  Labs Reviewed - No data to display Dg Chest 2 View  01/16/2012  *RADIOLOGY REPORT*  Clinical Data: Congestion and productive cough for 2 days.  CHEST - 2 VIEW  Comparison: 10/04/2011  Findings: The heart size and pulmonary vascularity are normal. The lungs appear clear and expanded without focal air space disease or consolidation. No blunting of the costophrenic angles.  No pneumothorax.  The no significant change since the previous study.  IMPRESSION: No evidence of active pulmonary disease.  Original Report Authenticated By: Marlon Pel, M.D.     1. Cough   2. Asthma       MDM  Rocephin zithromax  Robitussin AC F/u with dr. Parke Simmers.        Worthy Rancher, PA 01/16/12 [phone]

## 2012-01-15 NOTE — ED Notes (Signed)
Cough, sore throat

## 2012-01-16 MED ORDER — AZITHROMYCIN 250 MG PO TABS
500.0000 mg | ORAL_TABLET | Freq: Once | ORAL | Status: AC
  Administered 2012-01-16: 500 mg via ORAL
  Filled 2012-01-16 (×2): qty 1

## 2012-01-16 MED ORDER — CEFTRIAXONE SODIUM 1 G IJ SOLR
1.0000 g | Freq: Once | INTRAMUSCULAR | Status: AC
  Administered 2012-01-16: 1 g via INTRAMUSCULAR
  Filled 2012-01-16: qty 10

## 2012-01-16 MED ORDER — AZITHROMYCIN 250 MG PO TABS: ORAL_TABLET | ORAL | Status: DC

## 2012-01-16 MED ORDER — GUAIFENESIN-CODEINE 100-10 MG/5ML PO SYRP: ORAL_SOLUTION | ORAL | Status: DC

## 2012-01-16 MED ORDER — HYDROCOD POLST-CHLORPHEN POLST 10-8 MG/5ML PO LQCR
5.0000 mL | Freq: Once | ORAL | Status: AC
  Administered 2012-01-16: 5 mL via ORAL
  Filled 2012-01-16: qty 5

## 2012-01-16 NOTE — Discharge Instructions (Signed)
Asthma, Adult  Asthma is a disease of the lungs and can make it hard to breathe. Asthma cannot be cured, but medicine can help control it. Asthma may be started (triggered) by:   Pollen.   Dust.   Animal skin flakes (dander).   Molds.   Foods.   Respiratory infections (colds, flu).   Smoke.   Exercise.   Stress.   Other things that cause allergic reactions or allergies (allergens).  HOME CARE    Talk to your doctor about how to manage your attacks at home. This may include:   Using a tool called a peak flow meter.   Having medicine ready to stop the attack.   Take all medicine as told by your doctor.   Wash bed sheets and blankets every week in hot water and put them in the dryer.   Drink enough fluids to keep your pee (urine) clear or pale yellow.   Always be ready to get emergency help. Write down the phone number for your doctor. Keep it where you can easily find it.   Talk about exercise routines with your doctor.   If animal dander is causing your asthma, you may need to find a new home for your pet(s).  GET HELP RIGHT AWAY IF:    You have muscle aches.   You cough more.   You have chest pain.   You have thick spit (sputum) that changes to yellow, green, gray, or bloody.   Medicine does not stop your wheezing.   You have problems breathing.   You have a fever.   Your medicine causes:   A rash.   Itching.   Puffiness (swelling).   Breathing problems.  MAKE SURE YOU:    Understand these instructions.   Will watch your condition.   Will get help right away if you are not doing well or get worse.  Document Released: 01/23/2008 Document Revised: 07/26/2011 Document Reviewed: 06/16/2008  [employer] Patient Information 2012 [employer].

## 2012-01-16 NOTE — ED Notes (Signed)
Pt. Discharged to home, pt. Alert and oriented, NAD noted, respirations even and regular 

## 2012-01-17 NOTE — ED Provider Notes (Signed)
Medical screening examination/treatment/procedure(s) were performed by non-physician practitioner and as supervising physician I was immediately available for consultation/collaboration.  Mong Neal S. Anastasija Anfinson, MD 01/17/12 2223 

## 2012-01-21 ENCOUNTER — Emergency Department (HOSPITAL_COMMUNITY)
Admission: EM | Admit: 2012-01-21 | Discharge: 2012-01-21 | Disposition: A | Discharge status: Home / Self Care | Payer: Medicare Other | Attending: Emergency Medicine | Admitting: Emergency Medicine

## 2012-01-21 ENCOUNTER — Emergency Department (HOSPITAL_COMMUNITY): Payer: Medicare Other

## 2012-01-21 ENCOUNTER — Encounter (HOSPITAL_COMMUNITY): Payer: Self-pay | Admitting: *Deleted

## 2012-01-21 DIAGNOSIS — R300 Dysuria: Secondary | ICD-10-CM | POA: Insufficient documentation

## 2012-01-21 DIAGNOSIS — I10 Essential (primary) hypertension: Secondary | ICD-10-CM | POA: Insufficient documentation

## 2012-01-21 DIAGNOSIS — Z79899 Other long term (current) drug therapy: Secondary | ICD-10-CM | POA: Insufficient documentation

## 2012-01-21 DIAGNOSIS — J45909 Unspecified asthma, uncomplicated: Secondary | ICD-10-CM | POA: Insufficient documentation

## 2012-01-21 DIAGNOSIS — K219 Gastro-esophageal reflux disease without esophagitis: Secondary | ICD-10-CM | POA: Insufficient documentation

## 2012-01-21 DIAGNOSIS — E119 Type 2 diabetes mellitus without complications: Secondary | ICD-10-CM | POA: Insufficient documentation

## 2012-01-21 DIAGNOSIS — M47817 Spondylosis without myelopathy or radiculopathy, lumbosacral region: Secondary | ICD-10-CM | POA: Insufficient documentation

## 2012-01-21 LAB — URINALYSIS, ROUTINE W REFLEX MICROSCOPIC
Bilirubin Urine: NEGATIVE
Glucose, UA: NEGATIVE mg/dL
Hgb urine dipstick: NEGATIVE
Ketones, ur: NEGATIVE mg/dL
Protein, ur: NEGATIVE mg/dL
Urobilinogen, UA: 0.2 mg/dL (ref 0.0–1.0)

## 2012-01-21 LAB — URINE MICROSCOPIC-ADD ON

## 2012-01-21 MED ORDER — IBUPROFEN 600 MG PO TABS: 600.0000 mg | ORAL_TABLET | Freq: Four times a day (QID) | ORAL | Status: AC | PRN

## 2012-01-21 MED ORDER — IBUPROFEN 200 MG PO TABS
600.0000 mg | ORAL_TABLET | Freq: Once | ORAL | Status: AC
  Administered 2012-01-21: 600 mg via ORAL
  Filled 2012-01-21: qty 3

## 2012-01-21 MED ORDER — METHOCARBAMOL 500 MG PO TABS: 500.0000 mg | ORAL_TABLET | Freq: Two times a day (BID) | ORAL | Status: AC

## 2012-01-21 MED ORDER — BENZONATATE 100 MG PO CAPS: 100.0000 mg | ORAL_CAPSULE | Freq: Three times a day (TID) | ORAL | Status: AC | PRN

## 2012-01-21 MED ORDER — TRAMADOL HCL 50 MG PO TABS: 50.0000 mg | ORAL_TABLET | Freq: Four times a day (QID) | ORAL | Status: AC | PRN

## 2012-01-21 MED ORDER — METHOCARBAMOL 500 MG PO TABS
500.0000 mg | ORAL_TABLET | Freq: Once | ORAL | Status: AC
  Administered 2012-01-21: 500 mg via ORAL
  Filled 2012-01-21: qty 1

## 2012-01-21 NOTE — ED Provider Notes (Signed)
History  This chart was scribed for Emily Racer, MD by Bennett Scrape. This patient was seen in room STRE3/STRE3 and the patient's care was started at 11:09AM.  CSN: [id number]  Arrival date & time 01/21/12  0944   First MD Initiated Contact with Patient 01/21/12 1109      Chief Complaint  Patient presents with  . Back Pain    Patient is a 61 y.o. female presenting with back pain. The history is provided by the patient. No language interpreter was used.  Back Pain  This is a new problem. The current episode started more than 2 days ago. The problem has been gradually worsening. The pain is associated with no known injury. The pain is present in the lumbar spine. Associated symptoms include dysuria. Pertinent negatives include no fever and no weakness. She has tried nothing for the symptoms.    TELIYAH Jordan is a 61 y.o. female with a h/o SVT, OA who presents to the Emergency Department complaining of 2 days of gradual onset, gradually worsening lower back pain described as spasms that is felt intermittently with associated chills. She states that it has started radiating up her spine to her neck. She denies injury or strenuous activity as the cause. She has a h/o sciatic but denies that the pain is similar to that. The pain is worse with sitting and laying down. She denies fever, numbness and weakness as associated symptoms. Pt also reports having frequency and dysuria last week. She states that she drank cranberry juice with improvement in the dysuria but is still experiencing frequency. She also c/o 5 days of cough productive of light yellow mucus and fever. Pt reports that she finished a zpak yesterday with improvement in the fever. She is a former smoker and denies alcohol use.    Past Medical History  Diagnosis Date  . SVT (supraventricular tachycardia)     no inducible arrhythmias by EP study 11/11  . Osteoarthritis     hands and knees  . Asthma   . DM (diabetes mellitus)     AO. no insulin; hemoglobin A1c of 8.4 in 2010.   Marland Kitchen GERD (gastroesophageal reflux disease)   . Depression   . HTN (hypertension)   . Hyperlipidemia     suboptimal profile in 9/10  . Lichen sclerosus     Past Surgical History  Procedure Date  . Cholecystectomy 2007  . Bilateral tubal ligation 1978  . Ep study 11/11    no inducible arrhythmias, no ablation performed    Family History  Problem Relation Age of Onset  . Liver disease Neg Hx     also Neg hx of CRC    History  Substance Use Topics  . Smoking status: Former Games developer  . Smokeless tobacco: Not on file   Comment: quit in 1980's  . Alcohol Use: No     no excessive use     Review of Systems  Constitutional: Positive for chills. Negative for fever.  Respiratory: Positive for cough. Negative for shortness of breath.   Gastrointestinal: Negative for nausea and vomiting.  Genitourinary: Positive for dysuria and frequency.  Musculoskeletal: Positive for back pain.  Neurological: Negative for weakness.    Allergies  Review of patient's allergies indicates no known allergies.  Home Medications     Triage Vitals: BP 146/78  Pulse 64  Temp(Src) 99.3 F (37.4 C) (Oral)  Resp 18  SpO2 97%  Physical Exam  Nursing note and vitals reviewed. Constitutional: She is oriented  to person, place, and time. She appears well-developed and well-nourished. No distress.  HENT:  Head: Normocephalic and atraumatic.  Eyes: EOM are normal.  Neck: Neck supple. No tracheal deviation present.  Cardiovascular: Normal rate.   Pulmonary/Chest: Effort normal. No respiratory distress.  Musculoskeletal: Normal range of motion. She exhibits tenderness (right lumbar paraspinal muscles, pain was ellicted with the flexion of the right hip ).       Negative straight leg raise, no motor deficits  Neurological: She is alert and oriented to person, place, and time.       No sensory deficits  Skin: Skin is warm and dry.  Psychiatric: She has  a normal mood and affect. Her behavior is normal.    ED Course  Procedures (including critical care time)  DIAGNOSTIC STUDIES: Oxygen Saturation is 97% on room air, adequate by my interpretation.    COORDINATION OF CARE: 11:30AM-Discussed treatment plan which includes urinalysis and x-ray with pt and pt agreed to plan.   Labs Reviewed  URINALYSIS, ROUTINE W REFLEX MICROSCOPIC - Abnormal; Notable for the following:    Leukocytes, UA SMALL (*)    All other components within normal limits  URINE MICROSCOPIC-ADD ON  LAB REPORT - SCANNED   No results found.   1. Lumbar and sacral arthritis       MDM    I personally performed the services described in this documentation, which was scribed in my presence. The recorded information has been reviewed and considered.    Emily Racer, MD 01/24/12 1700

## 2012-01-21 NOTE — ED Notes (Signed)
pT IS HERE WITH LOWER BACK SPASMS INTERMITTENTLY SINCE LAST WEEK.  PT REPORTS A HEADACHE THAT RESOLVED.  PT FINISHED ZPAK YESTERDAY FOR COUGH AND TEMP.

## 2012-02-27 ENCOUNTER — Other Ambulatory Visit (HOSPITAL_COMMUNITY): Payer: Self-pay | Admitting: Family Medicine

## 2012-02-27 DIAGNOSIS — Z139 Encounter for screening, unspecified: Secondary | ICD-10-CM

## 2012-03-03 ENCOUNTER — Other Ambulatory Visit (HOSPITAL_COMMUNITY): Payer: Self-pay | Admitting: Family Medicine

## 2012-03-03 ENCOUNTER — Ambulatory Visit (HOSPITAL_COMMUNITY)
Admission: RE | Admit: 2012-03-03 | Discharge: 2012-03-03 | Disposition: A | Discharge status: Home / Self Care | Payer: Medicare Other | Source: Ambulatory Visit | Attending: Family Medicine | Admitting: Family Medicine

## 2012-03-03 DIAGNOSIS — M949 Disorder of cartilage, unspecified: Secondary | ICD-10-CM | POA: Insufficient documentation

## 2012-03-03 DIAGNOSIS — M12519 Traumatic arthropathy, unspecified shoulder: Secondary | ICD-10-CM

## 2012-03-03 DIAGNOSIS — M899 Disorder of bone, unspecified: Secondary | ICD-10-CM | POA: Insufficient documentation

## 2012-03-03 DIAGNOSIS — M25569 Pain in unspecified knee: Secondary | ICD-10-CM | POA: Insufficient documentation

## 2012-03-03 DIAGNOSIS — M25549 Pain in joints of unspecified hand: Secondary | ICD-10-CM | POA: Insufficient documentation

## 2012-03-03 DIAGNOSIS — Z139 Encounter for screening, unspecified: Secondary | ICD-10-CM

## 2012-03-03 DIAGNOSIS — Z1231 Encounter for screening mammogram for malignant neoplasm of breast: Secondary | ICD-10-CM | POA: Insufficient documentation

## 2012-03-21 ENCOUNTER — Encounter: Payer: Self-pay | Admitting: Internal Medicine

## 2012-03-21 ENCOUNTER — Telehealth: Payer: Self-pay

## 2012-03-21 NOTE — Telephone Encounter (Signed)
Received phone call from [place] at [facility] office wanting patient seen today.States patient was just seen by Dr.Bland sob,,joint pain, no chest pain.B/P rt 90/61 p 45 lf 85/62 p 43.Spoke with DOD Dr.Ross she called and spoke to Dr.Bland.She advised to stop diltiazem,hold pm dose of lopressor tonight. Dr.Bland to check cbc,bmet,u/a.Patient to come to Dr.Allred's office Monday 03/24/12 to have EKG and B/P check.

## 2012-03-24 ENCOUNTER — Telehealth: Payer: Self-pay | Admitting: Internal Medicine

## 2012-03-24 ENCOUNTER — Encounter: Payer: Self-pay | Admitting: Internal Medicine

## 2012-03-24 NOTE — Telephone Encounter (Signed)
This encounter was created in error - please disregard.

## 2012-03-24 NOTE — Telephone Encounter (Signed)
[name] 03/24/2012 2:46 PM Signed  New msg  Pt called and said her BP has been 180/101 today. She had appt this morning but pcp didn't tell her about the appt.

## 2012-03-24 NOTE — Telephone Encounter (Signed)
New msg Pt called and said her BP has been 180/101 today. She had appt this morning but pcp didn't tell her about the appt.

## 2012-03-24 NOTE — Telephone Encounter (Signed)
Please return call to patient [phone], regarding tachycardia symptoms.

## 2012-03-24 NOTE — Telephone Encounter (Signed)
Spoke with patient.  She is fine now.  But over the weekend her heart rate went up.  Her PCP has stopped her Metoprolol due to her HR being 47.  Now her BP is high  I have asked her to call Dr Parke Simmers and get her to recommend a BP agent for her to take that will not effect her HR.  She was scheduled for  BP check and an EKG here today but was not informed so did not come.  She is due for her yearly appointment in [place] with Dr Johney Frame and I have sent the office a note to call her and schedule.

## 2012-04-09 ENCOUNTER — Encounter (INDEPENDENT_AMBULATORY_CARE_PROVIDER_SITE_OTHER): Payer: Medicare Other | Admitting: Internal Medicine

## 2012-04-09 ENCOUNTER — Ambulatory Visit (INDEPENDENT_AMBULATORY_CARE_PROVIDER_SITE_OTHER): Payer: Medicare Other | Admitting: Internal Medicine

## 2012-04-09 ENCOUNTER — Encounter: Payer: Self-pay | Admitting: Internal Medicine

## 2012-04-09 ENCOUNTER — Other Ambulatory Visit: Payer: Self-pay | Admitting: *Deleted

## 2012-04-09 VITALS — BP 130/88 | HR 65 | Ht 64.0 in | Wt 200.0 lb

## 2012-04-09 DIAGNOSIS — R0989 Other specified symptoms and signs involving the circulatory and respiratory systems: Secondary | ICD-10-CM

## 2012-04-09 DIAGNOSIS — R002 Palpitations: Secondary | ICD-10-CM

## 2012-04-09 DIAGNOSIS — I471 Supraventricular tachycardia: Secondary | ICD-10-CM

## 2012-04-09 DIAGNOSIS — R0683 Snoring: Secondary | ICD-10-CM

## 2012-04-09 DIAGNOSIS — E669 Obesity, unspecified: Secondary | ICD-10-CM

## 2012-04-09 DIAGNOSIS — R5381 Other malaise: Secondary | ICD-10-CM

## 2012-04-09 DIAGNOSIS — R5383 Other fatigue: Secondary | ICD-10-CM

## 2012-04-09 NOTE — Patient Instructions (Addendum)
Your physician recommends that you schedule a follow-up appointment in: 6 weeks with Dr. Johney Frame at [facility]. Location. Your physician has recommended you make the following change in your medication: Stop metoprolol and start diltiazem 360 mg daily. All other medications will remain the same. You are being referred to Dr. Andrey Campanile for a sleep study. Your physician has recommended that you wear an event monitor. [employer] will contact you directly about this. Event monitors are medical devices that record the heart's electrical activity. Doctors most often [place] these monitors to diagnose arrhythmias. Arrhythmias are problems with the speed or rhythm of the heartbeat. The monitor is a small, portable device. You can wear one while you do your normal daily activities. This is usually used to diagnose what is causing palpitations/syncope (passing out).

## 2012-04-10 ENCOUNTER — Encounter (HOSPITAL_COMMUNITY): Payer: Self-pay | Admitting: *Deleted

## 2012-04-10 ENCOUNTER — Telehealth: Payer: Self-pay | Admitting: *Deleted

## 2012-04-10 ENCOUNTER — Emergency Department (HOSPITAL_COMMUNITY): Payer: Medicare Other

## 2012-04-10 ENCOUNTER — Observation Stay (HOSPITAL_COMMUNITY)
Admission: EM | Admit: 2012-04-10 | Discharge: 2012-04-11 | Disposition: A | Discharge status: Home / Self Care | Payer: Medicare Other | Attending: Internal Medicine | Admitting: Internal Medicine

## 2012-04-10 DIAGNOSIS — I471 Supraventricular tachycardia, unspecified: Secondary | ICD-10-CM | POA: Diagnosis present

## 2012-04-10 DIAGNOSIS — E669 Obesity, unspecified: Secondary | ICD-10-CM | POA: Diagnosis present

## 2012-04-10 DIAGNOSIS — E785 Hyperlipidemia, unspecified: Secondary | ICD-10-CM | POA: Diagnosis present

## 2012-04-10 DIAGNOSIS — Z8679 Personal history of other diseases of the circulatory system: Secondary | ICD-10-CM

## 2012-04-10 DIAGNOSIS — E119 Type 2 diabetes mellitus without complications: Secondary | ICD-10-CM | POA: Diagnosis present

## 2012-04-10 DIAGNOSIS — K649 Unspecified hemorrhoids: Secondary | ICD-10-CM

## 2012-04-10 DIAGNOSIS — J45901 Unspecified asthma with (acute) exacerbation: Secondary | ICD-10-CM

## 2012-04-10 DIAGNOSIS — K219 Gastro-esophageal reflux disease without esophagitis: Secondary | ICD-10-CM

## 2012-04-10 DIAGNOSIS — K625 Hemorrhage of anus and rectum: Secondary | ICD-10-CM

## 2012-04-10 DIAGNOSIS — J45909 Unspecified asthma, uncomplicated: Secondary | ICD-10-CM

## 2012-04-10 DIAGNOSIS — Z6834 Body mass index (BMI) 34.0-34.9, adult: Secondary | ICD-10-CM | POA: Insufficient documentation

## 2012-04-10 DIAGNOSIS — R079 Chest pain, unspecified: Principal | ICD-10-CM | POA: Diagnosis present

## 2012-04-10 DIAGNOSIS — J4530 Mild persistent asthma, uncomplicated: Secondary | ICD-10-CM | POA: Diagnosis present

## 2012-04-10 DIAGNOSIS — R002 Palpitations: Secondary | ICD-10-CM

## 2012-04-10 DIAGNOSIS — E118 Type 2 diabetes mellitus with unspecified complications: Secondary | ICD-10-CM

## 2012-04-10 LAB — CBC
HCT: 38.7 % (ref 36.0–46.0)
MCV: 87.2 fL (ref 78.0–100.0)
RDW: 13.8 % (ref 11.5–15.5)
WBC: 9.9 10*3/uL (ref 4.0–10.5)

## 2012-04-10 LAB — CREATININE, SERUM
Creatinine, Ser: 0.65 mg/dL (ref 0.50–1.10)
GFR calc non Af Amer: >90 mL/min (ref >90)

## 2012-04-10 LAB — BASIC METABOLIC PANEL
BUN: 14 mg/dL (ref 6–23)
CO2: 24 mEq/L (ref 19–32)
Chloride: 100 mEq/L (ref 96–112)
Creatinine, Ser: 0.65 mg/dL (ref 0.50–1.10)
GFR calc Af Amer: >90 mL/min (ref >90)
Glucose, Bld: 155 mg/dL — ABNORMAL HIGH (ref 70–99)

## 2012-04-10 LAB — CARDIAC PANEL(CRET KIN+CKTOT+MB+TROPI): CK, MB: 1.8 ng/mL (ref 0.3–4.0)

## 2012-04-10 LAB — GLUCOSE, CAPILLARY: Glucose-Capillary: 190 mg/dL — ABNORMAL HIGH (ref 70–99)

## 2012-04-10 MED ORDER — TRAZODONE HCL 50 MG PO TABS: 50.0000 mg | ORAL_TABLET | Freq: Every evening | ORAL | Status: DC | PRN

## 2012-04-10 MED ORDER — GLIPIZIDE 5 MG PO TABS
10.0000 mg | ORAL_TABLET | Freq: Two times a day (BID) | ORAL | Status: DC
  Administered 2012-04-11: 10 mg via ORAL
  Filled 2012-04-10: qty 2

## 2012-04-10 MED ORDER — ASPIRIN EC 81 MG PO TBEC
81.0000 mg | DELAYED_RELEASE_TABLET | Freq: Every day | ORAL | Status: DC
  Administered 2012-04-11: 81 mg via ORAL
  Filled 2012-04-10: qty 1

## 2012-04-10 MED ORDER — SIMVASTATIN 20 MG PO TABS
20.0000 mg | ORAL_TABLET | Freq: Every evening | ORAL | Status: DC
  Administered 2012-04-10: 20 mg via ORAL

## 2012-04-10 MED ORDER — SODIUM CHLORIDE 0.9 % IJ SOLN
3.0000 mL | Freq: Two times a day (BID) | INTRAMUSCULAR | Status: DC
  Administered 2012-04-10 – 2012-04-11 (×2): 3 mL via INTRAVENOUS
  Filled 2012-04-10: qty 3

## 2012-04-10 MED ORDER — ASPIRIN 81 MG PO CHEW
324.0000 mg | CHEWABLE_TABLET | Freq: Once | ORAL | Status: AC
  Administered 2012-04-10: 324 mg via ORAL
  Filled 2012-04-10: qty 4

## 2012-04-10 MED ORDER — ADULT MULTIVITAMIN W/MINERALS CH
1.0000 | ORAL_TABLET | Freq: Every day | ORAL | Status: DC
  Administered 2012-04-11: 1 via ORAL
  Filled 2012-04-10: qty 1

## 2012-04-10 MED ORDER — ENOXAPARIN SODIUM 40 MG/0.4ML ~~LOC~~ SOLN
40.0000 mg | SUBCUTANEOUS | Status: DC
  Administered 2012-04-10: 40 mg via SUBCUTANEOUS

## 2012-04-10 MED ORDER — DILTIAZEM HCL ER COATED BEADS 240 MG PO CP24
360.0000 mg | ORAL_CAPSULE | Freq: Every day | ORAL | Status: DC
  Filled 2012-04-10: qty 1

## 2012-04-10 MED ORDER — LINAGLIPTIN 5 MG PO TABS
5.0000 mg | ORAL_TABLET | Freq: Every day | ORAL | Status: DC
  Administered 2012-04-11: 5 mg via ORAL
  Filled 2012-04-10: qty 1

## 2012-04-10 MED ORDER — NITROGLYCERIN 0.4 MG SL SUBL: 0.4000 mg | SUBLINGUAL_TABLET | SUBLINGUAL | Status: DC | PRN

## 2012-04-10 MED ORDER — METFORMIN HCL 850 MG PO TABS
850.0000 mg | ORAL_TABLET | Freq: Three times a day (TID) | ORAL | Status: DC
  Administered 2012-04-11: 850 mg via ORAL
  Filled 2012-04-10 (×11): qty 1

## 2012-04-10 MED ORDER — ACETAMINOPHEN 325 MG PO TABS: 650.0000 mg | ORAL_TABLET | Freq: Four times a day (QID) | ORAL | Status: DC | PRN

## 2012-04-10 MED ORDER — ONDANSETRON HCL 4 MG/2ML IJ SOLN: 4.0000 mg | Freq: Four times a day (QID) | INTRAMUSCULAR | Status: DC | PRN

## 2012-04-10 MED ORDER — SODIUM CHLORIDE 0.9 % IV SOLN: INTRAVENOUS | Status: DC

## 2012-04-10 MED ORDER — MORPHINE SULFATE 2 MG/ML IJ SOLN: 1.0000 mg | INTRAMUSCULAR | Status: DC | PRN

## 2012-04-10 MED ORDER — SODIUM CHLORIDE 0.9 % IJ SOLN
3.0000 mL | Freq: Two times a day (BID) | INTRAMUSCULAR | Status: DC
  Filled 2012-04-10: qty 3

## 2012-04-10 MED ORDER — ACETAMINOPHEN 650 MG RE SUPP: 650.0000 mg | Freq: Four times a day (QID) | RECTAL | Status: DC | PRN

## 2012-04-10 MED ORDER — ALUM & MAG HYDROXIDE-SIMETH 200-200-20 MG/5ML PO SUSP: 30.0000 mL | Freq: Four times a day (QID) | ORAL | Status: DC | PRN

## 2012-04-10 MED ORDER — SODIUM CHLORIDE 0.9 % IJ SOLN: 3.0000 mL | INTRAMUSCULAR | Status: DC | PRN

## 2012-04-10 MED ORDER — PANTOPRAZOLE SODIUM 40 MG PO TBEC
40.0000 mg | DELAYED_RELEASE_TABLET | Freq: Every day | ORAL | Status: DC
  Administered 2012-04-11: 40 mg via ORAL
  Filled 2012-04-10: qty 1

## 2012-04-10 MED ORDER — PNEUMOCOCCAL VAC POLYVALENT 25 MCG/0.5ML IJ INJ
0.5000 mL | INJECTION | INTRAMUSCULAR | Status: AC
  Administered 2012-04-11: 0.5 mL via INTRAMUSCULAR
  Filled 2012-04-10: qty 0.5

## 2012-04-10 MED ORDER — ONDANSETRON HCL 4 MG PO TABS: 4.0000 mg | ORAL_TABLET | Freq: Four times a day (QID) | ORAL | Status: DC | PRN

## 2012-04-10 MED ORDER — SODIUM CHLORIDE 0.9 % IV SOLN: 250.0000 mL | INTRAVENOUS | Status: DC | PRN

## 2012-04-10 MED ORDER — NITROGLYCERIN 0.4 MG SL SUBL
0.4000 mg | SUBLINGUAL_TABLET | SUBLINGUAL | Status: DC | PRN
  Administered 2012-04-10: 0.4 mg via SUBLINGUAL
  Filled 2012-04-10: qty 25

## 2012-04-10 NOTE — H&P (Signed)
Hospital Admission Note Date: 04/10/2012  Patient name: [name] Medical record number: [id number] Date of birth: Nov 29, 1950 Age: 61 y.o. Gender: female PCP: Geraldo Pitter, MD  Attending physician: Christiane Ha, MD  Chief Complaint:  Chest pressure  History of Present Illness:  [name] is an 61 y.o. female who presents with substernal chest pressure today while cleaning house. The pain was about 5/10 and lasted for several hours. By the time she was in the emergency room it was about 2/10 and subsequently resolved after receiving sublingual nitroglycerin and aspirin. She had diaphoresis and dizziness with it. No chest pain currently. She had a stress test in 2009 which was normal but she only exercised for 5 minutes then fatigued. Followed by lobe our cardiology for paroxysmal supraventricular tachycardia. She feels tired currently but otherwise is asymptomatic. EKG and cardiac enzymes show no evidence of ischemia thus far. She was seen in the office by Dr. Johney Frame yesterday.  Past Medical History  Diagnosis Date  . SVT (supraventricular tachycardia)     no inducible arrhythmias by EP study 11/11  . Osteoarthritis     hands and knees  . Asthma   . DM (diabetes mellitus)     AO. no insulin; hemoglobin A1c of 8.4 in 2010.   Marland Kitchen GERD (gastroesophageal reflux disease)   . Depression   . HTN (hypertension)   . Hyperlipidemia     suboptimal profile in 9/10  . Lichen sclerosus     Meds: Prescriptions prior to admission  Medication Sig Dispense Refill  . aspirin EC 81 MG tablet Take 81 mg by mouth daily.      Marland Kitchen BIOTIN PO Take 5 capsules by mouth daily.      . clobetasol (TEMOVATE) 0.05 % cream Apply 1 application topically 2 (two) times daily.        Marland Kitchen diltiazem (TIAZAC) 360 MG 24 hr capsule Take 1 capsule (360 mg total) by mouth daily.  90 capsule  3  . glipiZIDE (GLUCOTROL) 10 MG tablet Take 10 mg by mouth 2 (two) times daily.        . hydrOXYzine (VISTARIL) 25 MG  capsule Take 25 mg by mouth as needed.        . metFORMIN (GLUCOPHAGE) 850 MG tablet Take 850 mg by mouth 3 (three) times daily.        . Multiple Vitamin (MULITIVITAMIN WITH MINERALS) TABS Take 1 tablet by mouth daily.      . naproxen (NAPROSYN) 500 MG tablet Take 500 mg by mouth Twice daily.      . Omega-3 Fatty Acids (FISH OIL) 1000 MG CAPS Take 1 capsule by mouth 3 (three) times daily.       Marland Kitchen omeprazole (PRILOSEC) 20 MG capsule Take 20 mg by mouth every other day.       . simvastatin (ZOCOR) 20 MG tablet Take 20 mg by mouth every evening.      . sitaGLIPtin (JANUVIA) 25 MG tablet Take 25 mg by mouth daily.      Marland Kitchen UNABLE TO FIND Apply 1 application topically daily as needed. Med Name: TRIAMCINOLONE + UNKNOWN MIX-Compounded by pharmacy        Allergies: Review of patient's allergies indicates no known allergies. History   Social History  . Marital Status: Married    Spouse Name: N/A    Number of Children: N/A  . Years of Education: N/A   Occupational History  . Not on file.   Social History Main  Topics  . Smoking status: Former Games developer  . Smokeless tobacco: Not on file   Comment: quit in 1980's  . Alcohol Use: No     no excessive use  . Drug Use: No  . Sexually Active: Yes    Birth Control/ Protection: Post-menopausal   Other Topics Concern  . Not on file   Social History Narrative   Married, 3 children. Lives in [place], unemployed.    Family History  Problem Relation Age of Onset  . Liver disease Neg Hx     also Neg hx of CRC   Past Surgical History  Procedure Date  . Cholecystectomy 2007  . Bilateral tubal ligation 1978  . Ep study 11/11    no inducible arrhythmias, no ablation performed    Review of Systems: Systems reviewed and as per HPI, otherwise negative.  Physical Exam: Blood pressure 132/75, pulse 52, temperature 98.2 F (36.8 C), temperature source Oral, resp. rate 18, SpO2 97.00%. BP 132/75  Pulse 52  Temp 98.2 F (36.8 C) (Oral)  Resp 18   SpO2 97%  General Appearance:    Alert, cooperative, no distress, appears stated age. obese  Head:    Normocephalic, without obvious abnormality, atraumatic  Eyes:    PERRL, conjunctiva/corneas clear, EOM's intact, fundi    benign, both eyes  Ears:    Normal TM's and external ear canals, both ears  Nose:   Nares normal, septum midline, mucosa normal, no drainage    or sinus tenderness  Throat:   Lips, mucosa, and tongue normal; teeth and gums normal  Neck:   Supple, symmetrical, trachea midline, no adenopathy;    thyroid:  no enlargement/tenderness/nodules; no carotid   bruit or JVD  Back:     Symmetric, no curvature, ROM normal, no CVA tenderness  Lungs:     Clear to auscultation bilaterally, respirations unlabored  Chest Wall:    No tenderness or deformity   Heart:    Regular rate and rhythm, S1 and S2 normal, no murmur, rub   or gallop     Abdomen:     Soft, non-tender, bowel sounds active all four quadrants,    no masses, no organomegaly  Genitalia:    Normal female without lesion, discharge or tenderness  Rectal:    Normal tone, normal prostate, no masses or tenderness;   guaiac negative stool  Extremities:   Extremities normal, atraumatic, no cyanosis or edema  Pulses:   2+ and symmetric all extremities  Skin:   Skin color, texture, turgor normal, no rashes or lesions  Lymph nodes:   Cervical, supraclavicular, and axillary nodes normal  Neurologic:   CNII-XII intact, normal strength, sensation and reflexes    throughout    Psychiatric:  Normal affect  Lab results: Basic Metabolic Panel:  Basename 04/10/12 1349  NA 137  K 4.2  CL 100  CO2 24  GLUCOSE 155*  BUN 14  CREATININE 0.65  CALCIUM 10.0  MG --  PHOS --   Liver Function Tests: No results found for this basename: AST:2,ALT:2,ALKPHOS:2,BILITOT:2,PROT:2,ALBUMIN:2 in the last 72 hours No results found for this basename: LIPASE:2,AMYLASE:2 in the last 72 hours No results found for this basename: AMMONIA:2 in the  last 72 hours CBC:  Basename 04/10/12 1349  WBC 9.9  NEUTROABS --  HGB 12.6  HCT 38.7  MCV 87.2  PLT 245   Cardiac Enzymes:  Basename 04/10/12 1557 04/10/12 1349  CKTOTAL -- --  CKMB -- --  CKMBINDEX -- --  TROPONINI <  0.30 <0.30   BNP: No results found for this basename: PROBNP:3 in the last 72 hours D-Dimer: No results found for this basename: DDIMER:2 in the last 72 hours CBG: No results found for this basename: GLUCAP:6 in the last 72 hours Hemoglobin A1C: No results found for this basename: HGBA1C in the last 72 hours Fasting Lipid Panel: No results found for this basename: CHOL,HDL,LDLCALC,TRIG,CHOLHDL,LDLDIRECT in the last 72 hours Thyroid Function Tests: No results found for this basename: TSH,T4TOTAL,FREET4,T3FREE,THYROIDAB in the last 72 hours Anemia Panel: No results found for this basename: VITAMINB12,FOLATE,FERRITIN,TIBC,IRON,RETICCTPCT in the last 72 hours Coagulation:  Imaging results:  Dg Chest 2 View  04/10/2012  *RADIOLOGY REPORT*  Clinical Data: Chest pain.  CHEST - 2 VIEW  Comparison:  01/15/2012  Findings:  The heart size and mediastinal contours are within normal limits.  Both lungs are clear.  The visualized skeletal structures are unremarkable.  IMPRESSION: No active cardiopulmonary disease.   Original Report Authenticated By: Danae Orleans, M.D.    EKG: Sinus bradycardia  Assessment & Plan: Principal Problem:  *Chest pain Active Problems:  DIAB W/UNS COMP TYPE II/UNS NOT STATED UNCNTRL  Paroxysmal supraventricular tachycardia  ASTHMA  GERD  Hyperlipidemia  Obesity  Patient will be placed on observation. Telemetry. Rule out MI. Consult cardiology in the morning. Resume outpatient medications.  Concetta Guion L 04/10/2012, 6:11 PM

## 2012-04-10 NOTE — ED Notes (Signed)
Pt states "heaviness to the chest", non-radiating pain. Pain began at 1110 this morning. Pt states dizziness and "sweating" earlier. Pt was taken off metoprolol yesterday and switched back to diltiazem. Pt states she was taken off of metoprolol because it was making her "very tired"

## 2012-04-10 NOTE — ED Provider Notes (Signed)
History    This chart was scribed for Shelda Jakes, MD, MD by Smitty Pluck. The patient was seen in room APA16A and the patient's care was started at 1:42PM.   CSN: [id number]  Arrival date & time 04/10/12  1304   First MD Initiated Contact with Patient 04/10/12 1330      Chief Complaint  Patient presents with  . Chest Pain    (Consider location/radiation/quality/duration/timing/severity/associated sxs/prior treatment) Patient is a 61 y.o. female presenting with chest pain. The history is provided by the patient.  Chest Pain The chest pain began 3 - 5 hours ago. Chest pain occurs constantly. The chest pain is unchanged. At its most intense, the pain is at 5/10. The pain is currently at 5/10. The severity of the pain is moderate. The quality of the pain is described as pressure-like. The pain does not radiate. Chest pain is worsened by exertion. Primary symptoms include shortness of breath. Pertinent negatives for primary symptoms include no fever, no cough, no nausea and no vomiting.  Associated symptoms include diaphoresis.  Pertinent negatives for associated symptoms include no numbness. She tried aspirin for the symptoms.    [name] is a 61 y.o. female who presents to the Emergency Department complaining of moderate heaviness in substernal chest today at 11AM. Denies radiation. Pt rates the pain 5/10. Denies hx of similar pain. Pt reports that she has SOB with exertion. She reports having diaphoresis and dizziness during onset. Denies nausea, vomiting and abdominal pain. Pt has hx of tachycardia for past 30 years. Pt say cardiologist 1 day ago and she was taken off of metoprolol. She took diltiazem today. Pt takes 81 asa daily.     PCP is Dr. Parke Simmers Pt goes to [facility]  Past Medical History  Diagnosis Date  . SVT (supraventricular tachycardia)     no inducible arrhythmias by EP study 11/11  . Osteoarthritis     hands and knees  . Asthma   . DM (diabetes  mellitus)     AO. no insulin; hemoglobin A1c of 8.4 in 2010.   Marland Kitchen GERD (gastroesophageal reflux disease)   . Depression   . HTN (hypertension)   . Hyperlipidemia     suboptimal profile in 9/10  . Lichen sclerosus     Past Surgical History  Procedure Date  . Cholecystectomy 2007  . Bilateral tubal ligation 1978  . Ep study 11/11    no inducible arrhythmias, no ablation performed    Family History  Problem Relation Age of Onset  . Liver disease Neg Hx     also Neg hx of CRC    History  Substance Use Topics  . Smoking status: Former Games developer  . Smokeless tobacco: Not on file   Comment: quit in 1980's  . Alcohol Use: No     no excessive use    OB History    Grav Para Term Preterm Abortions TAB SAB Ect Mult Living                  Review of Systems  Constitutional: Positive for diaphoresis. Negative for fever and chills.  HENT: Negative for sore throat and neck pain.   Eyes: Negative for visual disturbance.  Respiratory: Positive for shortness of breath. Negative for cough.   Cardiovascular: Positive for chest pain.  Gastrointestinal: Negative for nausea and vomiting.  Skin: Negative for rash.  Neurological: Negative for numbness and headaches.    Allergies  Review of patient's allergies indicates no  known allergies.  Home Medications     BP 115/65  Pulse 58  Temp 99 F (37.2 C) (Oral)  Resp 13  SpO2 94%  Physical Exam  Nursing note and vitals reviewed. Constitutional: She is oriented to person, place, and time. She appears well-developed and well-nourished. No distress.  HENT:  Head: Normocephalic and atraumatic.  Mouth/Throat: Oropharynx is clear and moist.  Neck: Normal range of motion. Neck supple.  Cardiovascular: Normal heart sounds.   No murmur heard.      Slow heart rate but nl  Pulmonary/Chest: Effort normal and breath sounds normal. No respiratory distress. She has no wheezes.  Abdominal: Soft. Bowel sounds are decreased.  Musculoskeletal:  Normal range of motion. She exhibits no edema.  Neurological: She is alert and oriented to person, place, and time. No cranial nerve deficit. Coordination normal.  Skin: Skin is warm and dry.  Psychiatric: She has a normal mood and affect. Her behavior is normal.    ED Course  Procedures (including critical care time) DIAGNOSTIC STUDIES: Oxygen Saturation is 96% on room air, normal by my interpretation.    COORDINATION OF CARE: 1:54PM ordered:   Medications  nitroGLYCERIN (NITROSTAT) SL tablet 0.4 mg (0.4 mg Sublingual Given 04/10/12 1541)  naproxen (NAPROSYN) 500 MG tablet (not administered)  aspirin EC 81 MG tablet (not administered)  0.9 %  sodium chloride infusion (not administered)  aspirin chewable tablet 324 mg (324 mg Oral Given 04/10/12 1541)      Labs Reviewed  BASIC METABOLIC PANEL - Abnormal; Notable for the following:    Glucose, Bld 155 (*)     All other components within normal limits  CBC  TROPONIN I  TROPONIN I   Dg Chest 2 View  04/10/2012  *RADIOLOGY REPORT*  Clinical Data: Chest pain.  CHEST - 2 VIEW  Comparison:  01/15/2012  Findings:  The heart size and mediastinal contours are within normal limits.  Both lungs are clear.  The visualized skeletal structures are unremarkable.  IMPRESSION: No active cardiopulmonary disease.   Original Report Authenticated By: Danae Orleans, M.D.    Results for orders placed during the hospital encounter of 04/10/12  CBC      Component Value Range   WBC 9.9  4.0 - 10.5 K/uL   RBC 4.44  3.87 - 5.11 MIL/uL   Hemoglobin 12.6  12.0 - 15.0 g/dL   HCT 16.1  09.6 - 04.5 %   MCV 87.2  78.0 - 100.0 fL   MCH 28.4  26.0 - 34.0 pg   MCHC 32.6  30.0 - 36.0 g/dL   RDW 40.9  81.1 - 91.4 %   Platelets 245  150 - 400 K/uL  BASIC METABOLIC PANEL      Component Value Range   Sodium 137  135 - 145 mEq/L   Potassium 4.2  3.5 - 5.1 mEq/L   Chloride 100  96 - 112 mEq/L   CO2 24  19 - 32 mEq/L   Glucose, Bld 155 (*) 70 - 99 mg/dL   BUN  14  6 - 23 mg/dL   Creatinine, Ser 7.82  0.50 - 1.10 mg/dL   Calcium 95.6  8.4 - 21.3 mg/dL   GFR calc non Af Amer >90  >90 mL/min   GFR calc Af Amer >90  >90 mL/min  TROPONIN I      Component Value Range   Troponin I <0.30  <0.30 ng/mL     Date: 04/10/2012  Rate: 52  Rhythm:  sinus bradycardia and sinus arrhythmia  QRS Axis: normal  Intervals: normal  ST/T Wave abnormalities: nonspecific T wave changes  Conduction Disutrbances:none  Narrative Interpretation:   Old EKG Reviewed: unchanged No sniffing changes in EKG compared to 07/07/2010.   1. Chest pain       MDM  Patient with new onset of chest pain started it between 11 and 11:30 this morning arrived with chest pain 5/10 chest pain completely resolved with aspirin and one sublingual nitroglycerin. Troponins negative chest x-rays negative patient never had chest pain before. However she is followed by cardiology, with our cardiology for the history of supraventricular tachycardia. EKG has no acute changes here today. Discuss with triad hospitalist they will admit to obs telemetry team 1.      I personally performed the services described in this documentation, which was scribed in my presence. The recorded information has been reviewed and considered.     Shelda Jakes, MD 04/10/12 [id number]

## 2012-04-10 NOTE — Telephone Encounter (Signed)
Patient changed medication today starting diltiazem 360 mg. Patient took the medication @9 :00 am. @11 :10 am patient started feeling dizzy, feel's like her small dog is sitting on her chest, feeling anxious and HR decreased from 68 to 46, BP  142/79 @11 :10 am. Patient was sweating but now has stopped. Patient c/o of sob when she gets up and move around, but not just sitting still. Patient denies sharp pain in chest. Spoke with PA r/e complaints and he advised patient to monitor vitals today and her symptoms, if no improvement then she should go to ED for evaluation. Patient informed that this could be coming from the medication change. Patient informed and verbalized understanding of plan.

## 2012-04-11 DIAGNOSIS — I491 Atrial premature depolarization: Secondary | ICD-10-CM

## 2012-04-11 DIAGNOSIS — I471 Supraventricular tachycardia: Secondary | ICD-10-CM

## 2012-04-11 LAB — CARDIAC PANEL(CRET KIN+CKTOT+MB+TROPI)
CK, MB: 1.5 ng/mL (ref 0.3–4.0)
CK, MB: 1.7 ng/mL (ref 0.3–4.0)
Relative Index: INVALID (ref 0.0–2.5)
Total CK: 29 U/L (ref 7–177)
Troponin I: <0.3 ng/mL (ref <0.30)

## 2012-04-11 LAB — GLUCOSE, CAPILLARY: Glucose-Capillary: 205 mg/dL — ABNORMAL HIGH (ref 70–99)

## 2012-04-11 MED ORDER — METOPROLOL SUCCINATE ER 25 MG PO TB24: 25.0000 mg | ORAL_TABLET | Freq: Every day | ORAL | Status: DC

## 2012-04-11 MED ORDER — LISINOPRIL 5 MG PO TABS
5.0000 mg | ORAL_TABLET | Freq: Every day | ORAL | Status: DC
  Administered 2012-04-11: 5 mg via ORAL
  Filled 2012-04-11: qty 1

## 2012-04-11 NOTE — Progress Notes (Signed)
*  PRELIMINARY RESULTS* Echocardiogram 2D Echocardiogram has been performed.  [name] 04/11/2012, 1:45 PM

## 2012-04-11 NOTE — Progress Notes (Signed)
UR Chart Review Completed  

## 2012-04-11 NOTE — Progress Notes (Signed)
Discharge instructions given to patient with no questions. Patient left with husband via personal vehicle. Taken out of facility by staff.

## 2012-04-11 NOTE — Consult Note (Signed)
CARDIOLOGY CONSULT NOTE  Patient ID: FLORABELLE CARDIN MRN: [id number] DOB/AGE: Jan 04, 1951 61 y.o.  Admit date: 04/10/2012 Referring Physician: [facility] Primary Jenny Reichmann, MD Primary Cardiologist: Allred Reason for Consultation: Chest Pain with history of PSVT  HPI: Mrs. Thomasena Edis is a 61 year old patient of Dr. Hillis Range, and Dr. Lisabeth Pick, who was admitted via the ER with chest pain. She is followed by Dr. Johney Frame secondary to paroxysmal supraventricular tachycardia which has been quite hesitant on diltiazem and metoprolol. She has had an EP study completed by Dr. Johney Frame in 06/2010 which did not find any acute inducible tachyarrhythmias. She was recently seen by Dr. Johney Frame within the last 2 days prior to admission with medication changes. She was on diltiazem 360 mg daily and metoprolol 25 mg twice a day. In early August the patient's blood pressure dropped into the low 90s with a heart rate of a round 40 beats per minute. At that time Dr. Johney Frame discontinued diltiazem. Review of records in Epic did not have the recent note available at this time for confirmation.     She was continued on metoprolol without any further incident. She states she saw Dr. Johney Frame on August 21 for followup and complained of fatigue. Dr. Johney Frame to discontinued metoprolol and replaced it with diltiazem. The following day the patient had an episode of hypotension diaphoresis and bradycardia which caused significant chest pressure. She called the office and was sent to the ER. On arrival the emergency room the patient's blood pressure is 115/56 with a pulse of 58. EKG revealed no acute coronary syndrome. Initial troponin was negative. We are asked for further recommendations.    Other history includes hypertension, diabetes, depression, GERD, and hyperlipidemia. Review of records does not have augmentation of echocardiogram in the past or TSH results.  Review of systems complete and found to be negative unless  listed above   Past Medical History  Diagnosis Date  . SVT (supraventricular tachycardia)     no inducible arrhythmias by EP study 11/11  . Osteoarthritis     hands and knees  . Asthma   . DM (diabetes mellitus)     AO. no insulin; hemoglobin A1c of 8.4 in 2010.   Marland Kitchen GERD (gastroesophageal reflux disease)   . Depression   . HTN (hypertension)   . Hyperlipidemia     suboptimal profile in 9/10  . Lichen sclerosus     Family History  Problem Relation Age of Onset  . Liver disease Neg Hx     also Neg hx of CRC    History   Social History  . Marital Status: Married    Spouse Name: N/A    Number of Children: N/A  . Years of Education: N/A   Occupational History  . Not on file.   Social History Main Topics  . Smoking status: Former Games developer  . Smokeless tobacco: Not on file   Comment: quit in 1980's  . Alcohol Use: No     no excessive use  . Drug Use: No  . Sexually Active: Yes    Birth Control/ Protection: Post-menopausal   Other Topics Concern  . Not on file   Social History Narrative   Married, 3 children. Lives in [place], unemployed.     Past Surgical History  Procedure Date  . Cholecystectomy 2007  . Bilateral tubal ligation 1978  . Ep study 11/11    no inducible arrhythmias, no ablation performed    Prescriptions prior to admission  Medication Sig Dispense  Refill  . aspirin EC 81 MG tablet Take 81 mg by mouth daily.      Marland Kitchen diltiazem (TIAZAC) 360 MG 24 hr capsule Take 1 capsule (360 mg total) by mouth daily.  90 capsule  3  . glipiZIDE (GLUCOTROL) 10 MG tablet Take 10 mg by mouth 2 (two) times daily.        . hydrOXYzine (VISTARIL) 25 MG capsule Take 25 mg by mouth as needed.        . metFORMIN (GLUCOPHAGE) 850 MG tablet Take 850 mg by mouth 3 (three) times daily.        . naproxen (NAPROSYN) 500 MG tablet Take 500 mg by mouth Twice daily.      . Omega-3 Fatty Acids (FISH OIL) 1000 MG CAPS Take 1 capsule by mouth 3 (three) times daily.       Marland Kitchen omeprazole  (PRILOSEC) 20 MG capsule Take 20 mg by mouth every other day.       . simvastatin (ZOCOR) 20 MG tablet Take 20 mg by mouth every evening.      . sitaGLIPtin (JANUVIA) 25 MG tablet Take 25 mg by mouth daily.       Physical Exam: Blood pressure 123/78, pulse 67, temperature 98.1 F (36.7 C), temperature source Oral, resp. rate 16, height 5\' 4"  (1.626 m), weight 200 lb (90.719 kg), SpO2 97.00%.  General: Well developed, well nourished, in no acute distress Head: Eyes PERRLA, No xanthomas.   Normal cephalic and atramatic  Lungs: Clear bilaterally to auscultation and percussion. Heart: HRRR S1 S2, without MRG.  Pulses are 2+ & equal.            No carotid bruit. No JVD.  No abdominal bruits. No femoral bruits. Abdomen: Bowel sounds are positive, abdomen soft and non-tender without masses Msk:  Back normal, normal gait. Normal strength and tone Extremities: No clubbing, cyanosis or edema.  DP +1 Neuro: Alert and oriented X 3. Psych:  Good affect, responds appropriately  Lab Results  Component Value Date   WBC 9.9 04/10/2012   HGB 12.6 04/10/2012   HCT 38.7 04/10/2012   MCV 87.2 04/10/2012   PLT 245 04/10/2012    Lab 04/10/12 1828 04/10/12 1349  NA -- 137  K -- 4.2  CL -- 100  CO2 -- 24  BUN -- 14  CREATININE 0.65 --  CALCIUM -- 10.0  PROT -- --  BILITOT -- --  ALKPHOS -- --  ALT -- --  AST -- --  GLUCOSE -- 155*   EP Study  07/06/2012: No accessory pathways; tachycardia could not be induced.  Nuclear Study 12/03/2007:Negative for ischemia or infarction.   Radiology: CXR-Normal  FAO:ZHYQM bradycardia with sinus arrhythmia RSR' or QR pattern in V1 suggests right ventricular conduction delay  ASSESSMENT AND PLAN:  1. PSVT: Seen by Dr. Johney Frame 2 days ago. At that time the patient had been on both diltiazem and metoprolol at beginning of August 2013. She had an episode of hypotension and bradycardia and diltiazem was discontinued. She was doing well on followup with Dr. Johney Frame, but  did complain of some fatigue. Metoprolol was discontinued, and diltiazem was restarted.  Uncertain if reinstitution of diltiazem was etiology however she did have a similar episode  on diltiazem earlier this month while taking metoprolol as well. She refuses diltiazem now.   Check a TSH, and echocardiogram. Do not see that there is documentation of her LV function since her stress Myoview in 2009. We will arrange outpatient  followup.  2. Hypertension: Review of vital signs since admission shows that her blood pressure was mildly elevated during evening hours but was essentially controlled since admission.  She is not having symptoms at this time. She may need a lower dose of diltiazem, or stop it altogether. Will stop diltiazem, add ACE inhibitor and continue metoprolol.  Bettey Mare. Lyman Bishop NP [facility] 04/11/2012, 9:07 AM  Cardiology Attending Patient interviewed and examined. Discussed with Joni Reining, NP.  Above note annotated and modified based upon my findings.  She does not appear to be tolerating the combination of diltiazem and metoprolol at current dosage levels.  In light of the fact that she has not experienced tachycardia for years and had no tachycardia could be induced at EP study, its reasonable to try a single antiarrhythmic drug.  Diltiazem can be entirely discontinued and metoprolol continued at 25 mg twice a day.  We will arrange outpatient followup to monitor control of hypertension and PSVT.  Crozet Bing, MD 04/11/2012, 1:53 PM

## 2012-04-11 NOTE — Care Management Note (Signed)
    Page 1 of 1   04/11/2012     1:41:45 PM   CARE MANAGEMENT NOTE 04/11/2012  Patient:  [name], [name]   Account Number:  [id number]  Date Initiated:  04/11/2012  Documentation initiated by:  Rosemary Holms  Subjective/Objective Assessment:   Pt admitted from home where she lives with her spouse and takes care of her mother. She is anxious for DC and glad Dr. Dietrich Pates saw her so early. Pt concerned that Lubertha Basque was out on medical leave and she could not discuss her...     Action/Plan:   financial discount. CM contacted Melissa Montane via email to contact pt regarding her status.  Pt to DC home.   Anticipated DC Date:  04/11/2012   Anticipated DC Plan:  HOME/SELF CARE  In-house referral  Financial Counselor      DC Planning Services  CM consult      Choice offered to / List presented to:             Status of service:  Completed, signed off Medicare Important Message given?   (If response is "NO", the following Medicare IM given date fields will be blank) Date Medicare IM given:   Date Additional Medicare IM given:    Discharge Disposition:  HOME/SELF CARE  Per UR Regulation:    If discussed at Long Length of Stay Meetings, dates discussed:    Comments:  04/11/12 [address] Leanord Hawking RN BSN CM

## 2012-04-11 NOTE — Discharge Summary (Signed)
Physician Discharge Summary  Patient ID: [name] MRN: [id number] DOB/AGE: [birth year] 61 y.o.  Admit date: 04/10/2012 Discharge date: 04/11/2012  Discharge Diagnoses:  Principal Problem:  *Chest pain Active Problems:  DIAB W/UNS COMP TYPE II/UNS NOT STATED UNCNTRL  Paroxysmal supraventricular tachycardia  ASTHMA  GERD  Hyperlipidemia  Obesity  Tests pending at discharge:  Echocardiogram  Medication List  As of 04/11/2012  2:17 PM   STOP taking these medications         diltiazem 360 MG 24 hr capsule      naproxen 500 MG tablet         TAKE these medications         aspirin EC 81 MG tablet   Take 81 mg by mouth daily.      BIOTIN PO   Take 5 capsules by mouth daily.      clobetasol cream 0.05 %   Commonly known as: TEMOVATE   Apply 1 application topically 2 (two) times daily.      Fish Oil 1000 MG Caps   Take 1 capsule by mouth 3 (three) times daily.      glipiZIDE 10 MG tablet   Commonly known as: GLUCOTROL   Take 10 mg by mouth 2 (two) times daily.      hydrOXYzine 25 MG capsule   Commonly known as: VISTARIL   Take 25 mg by mouth as needed.      metFORMIN 850 MG tablet   Commonly known as: GLUCOPHAGE   Take 850 mg by mouth 3 (three) times daily.      metoprolol succinate 25 MG 24 hr tablet   Commonly known as: TOPROL-XL   Take 1 tablet (25 mg total) by mouth at bedtime.      multivitamin with minerals Tabs   Take 1 tablet by mouth daily.      omeprazole 20 MG capsule   Commonly known as: PRILOSEC   Take 20 mg by mouth every other day.      simvastatin 20 MG tablet   Commonly known as: ZOCOR   Take 20 mg by mouth every evening.      sitaGLIPtin 25 MG tablet   Commonly known as: JANUVIA   Take 25 mg by mouth daily.      UNABLE TO FIND   Apply 1 application topically daily as needed. Med Name: TRIAMCINOLONE + UNKNOWN MIX-Compounded by pharmacy            Discharge Orders    Future Appointments: Provider: Department: Dept Phone:  Center:   04/17/2012 2:20 PM Emily Parma, PA [facility] [phone] [facility]     Future Orders Please Complete By Expires   Diet - low sodium heart healthy      Diet Carb Modified      Activity as tolerated - No restrictions         Follow-up Information    Follow up with Emily Parma, PA on 04/17/2012. (2:20 pm)    Contact information:   [address] [place], [address] [place] [address] [phone]         Disposition: 01-Home or Self Care  Discharged Condition: stable  Consults: Treatment Team:  Emily Brunswick, MD  Labs:   Results for orders placed during the hospital encounter of 04/10/12 (from the past 48 hour(s))  CBC     Status: Normal   Collection Time   04/10/12  1:49 PM      Component Value Range Comment  WBC 9.9  4.0 - 10.5 K/uL    RBC 4.44  3.87 - 5.11 MIL/uL    Hemoglobin 12.6  12.0 - 15.0 g/dL    HCT 47.8  29.5 - 62.1 %    MCV 87.2  78.0 - 100.0 fL    MCH 28.4  26.0 - 34.0 pg    MCHC 32.6  30.0 - 36.0 g/dL    RDW 30.8  65.7 - 84.6 %    Platelets 245  150 - 400 K/uL   BASIC METABOLIC PANEL     Status: Abnormal   Collection Time   04/10/12  1:49 PM      Component Value Range Comment   Sodium 137  135 - 145 mEq/L    Potassium 4.2  3.5 - 5.1 mEq/L    Chloride 100  96 - 112 mEq/L    CO2 24  19 - 32 mEq/L    Glucose, Bld 155 (*) 70 - 99 mg/dL    BUN 14  6 - 23 mg/dL    Creatinine, Ser 9.62  0.50 - 1.10 mg/dL    Calcium 95.2  8.4 - 10.5 mg/dL    GFR calc non Af Amer >90  >90 mL/min    GFR calc Af Amer >90  >90 mL/min   TROPONIN I     Status: Normal   Collection Time   04/10/12  1:49 PM      Component Value Range Comment   Troponin I <0.30  <0.30 ng/mL   TROPONIN I     Status: Normal   Collection Time   04/10/12  3:57 PM      Component Value Range Comment   Troponin I <0.30  <0.30 ng/mL   CREATININE, SERUM     Status: Normal   Collection Time   04/10/12  6:28 PM      Component Value Range Comment   Creatinine, Ser 0.65  0.50  - 1.10 mg/dL    GFR calc non Af Amer >90  >90 mL/min    GFR calc Af Amer >90  >90 mL/min   CARDIAC PANEL(CRET KIN+CKTOT+MB+TROPI)     Status: Normal   Collection Time   04/10/12  6:28 PM      Component Value Range Comment   Total CK 37  7 - 177 U/L    CK, MB 1.8  0.3 - 4.0 ng/mL    Troponin I <0.30  <0.30 ng/mL    Relative Index RELATIVE INDEX IS INVALID  0.0 - 2.5   GLUCOSE, CAPILLARY     Status: Abnormal   Collection Time   04/10/12  9:09 PM      Component Value Range Comment   Glucose-Capillary 190 (*) 70 - 99 mg/dL    Comment 1 Notify RN     CARDIAC PANEL(CRET KIN+CKTOT+MB+TROPI)     Status: Normal   Collection Time   04/11/12 12:03 AM      Component Value Range Comment   Total CK 32  7 - 177 U/L    CK, MB 1.7  0.3 - 4.0 ng/mL    Troponin I <0.30  <0.30 ng/mL    Relative Index RELATIVE INDEX IS INVALID  0.0 - 2.5   CARDIAC PANEL(CRET KIN+CKTOT+MB+TROPI)     Status: Normal   Collection Time   04/11/12  4:47 AM      Component Value Range Comment   Total CK 29  7 - 177 U/L    CK, MB 1.5  0.3 - 4.0  ng/mL    Troponin I <0.30  <0.30 ng/mL    Relative Index RELATIVE INDEX IS INVALID  0.0 - 2.5   GLUCOSE, CAPILLARY     Status: Abnormal   Collection Time   04/11/12  7:19 AM      Component Value Range Comment   Glucose-Capillary 185 (*) 70 - 99 mg/dL   GLUCOSE, CAPILLARY     Status: Abnormal   Collection Time   04/11/12 11:09 AM      Component Value Range Comment   Glucose-Capillary 205 (*) 70 - 99 mg/dL     Diagnostics:  Dg Chest 2 View  04/10/2012  *RADIOLOGY REPORT*  Clinical Data: Chest pain.  CHEST - 2 VIEW  Comparison:  01/15/2012  Findings:  The heart size and mediastinal contours are within normal limits.  Both lungs are clear.  The visualized skeletal structures are unremarkable.  IMPRESSION: No active cardiopulmonary disease.   Original Report Authenticated By: [name], M.D.    EKG: sinus bradycardia  Hospital Course:  See H&P for complete admission.  [name]  who presented with several hours of substernal chest pressure. She is followed by lobe our cardiology for paroxysmal SVT. She had a recent medication change from metoprolol to includes diltiazem. Her pain resolved after receiving aspirin and nitroglycerin in the emergency room. EKG showed nothing concerning. Cardiac enzymes negative. Patient was admitted overnight to telemetry. She ruled out for MI. Cardiology was consulted and recommended echocardiogram, changing diltiazem back to metoprolol and outpatient followup with Dr. Johney Frame.  Discharge Exam:  Blood pressure 123/78, pulse 67, temperature 98.1 F (36.7 C), temperature source Oral, resp. rate 16, height 5\' 4"  (1.626 m), weight 90.719 kg (200 lb), SpO2 97.00%.  Unchanged from 04/10/12  Signed: Crista Curb L 04/11/2012, 2:17 PM

## 2012-04-12 ENCOUNTER — Encounter: Payer: Self-pay | Admitting: Internal Medicine

## 2012-04-12 DIAGNOSIS — R0683 Snoring: Secondary | ICD-10-CM | POA: Insufficient documentation

## 2012-04-12 NOTE — Assessment & Plan Note (Signed)
Weight loss is advised 

## 2012-04-12 NOTE — Progress Notes (Signed)
Emily Acosta, MD  The patient presents today for electrophysiology followup.  She recently presented to Dr Tedra Senegal office with heart rate of 45 bpm.  Her medicinces were adjusted.  She has occasional palpitations but no sustained arrhythmias.  She reports fatigue during the day.  She snores at night.  Today, she denies symptoms of  chest pain, shortness of breath, orthopnea, PND, lower extremity edema, presyncope, syncope, or neurologic sequela.  The patient feels that she is tolerating medications without difficulties and is otherwise without complaint today.   Past Medical History  Diagnosis Date  . SVT (supraventricular tachycardia)     no inducible arrhythmias by EP study 11/11  . Osteoarthritis     hands and knees  . Asthma   . DM (diabetes mellitus)     AO. no insulin; hemoglobin A1c of 8.4 in 2010.   Marland Kitchen GERD (gastroesophageal reflux disease)   . Depression   . HTN (hypertension)   . Hyperlipidemia     suboptimal profile in 9/10  . Lichen sclerosus    Past Surgical History  Procedure Date  . Cholecystectomy 2007  . Bilateral tubal ligation 1978  . Ep study 11/11    no inducible arrhythmias, no ablation performed      No Known Allergies  History   Social History  . Marital Status: Married    Spouse Name: N/A    Number of Children: N/A  . Years of Education: N/A   Occupational History  . Not on file.   Social History Main Topics  . Smoking status: Former Games developer  . Smokeless tobacco: Not on file   Comment: quit in 1980's  . Alcohol Use: No     no excessive use  . Drug Use: No  . Sexually Active: Yes    Birth Control/ Protection: Post-menopausal   Other Topics Concern  . Not on file   Social History Narrative   Married, 3 children. Lives in [place], unemployed.     Family History  Problem Relation Age of Onset  . Liver disease Neg Hx     also Neg hx of CRC   Physical Exam: Vitals BP 130/88, HR 65, Wt 200 lbs  GEN- The patient is overweight  appearing, alert and oriented x 3 today.   Head- normocephalic, atraumatic Eyes-  Sclera clear, conjunctiva pink Ears- hearing intact Oropharynx- clear Neck- supple, no JVP Lymph- no cervical lymphadenopathy Lungs- Clear to ausculation bilaterally, normal work of breathing Heart- Regular rate and rhythm, no murmurs, rubs or gallops, PMI not laterally displaced GI- soft, NT, ND, + BS Extremities- no clubbing, cyanosis, or edema MS- no significant deformity or atrophy Skin- no rash or lesion Psych- euthymic mood, full affect Neuro- strength and sensation are intact  ekg today reveals sinus rhythm 65 bpm,PR 166, QRS 98, Qtc 432, RsR'  Assessment and Plan:

## 2012-04-12 NOTE — Progress Notes (Signed)
Patient ID: [name], female   DOB: 02/22/1951, 61 y.o.   MRN: [id number]

## 2012-04-12 NOTE — Assessment & Plan Note (Signed)
Given fatigue and snoring, I will refer to Dr Andrey Campanile for sleep study to evaluate for sleep apnea as a cause

## 2012-04-12 NOTE — Assessment & Plan Note (Signed)
No inducible arrhythmias at EP study 2011.  She is presently doing very well though recently she did have bradycardia. At this point, I would favor stopping metoprolol and continueing cardizem 360mg  daily  We will place an event monitor to evaluate for further bradycardia or arrhythmias

## 2012-04-14 DIAGNOSIS — R002 Palpitations: Secondary | ICD-10-CM

## 2012-04-17 ENCOUNTER — Encounter: Payer: Self-pay | Admitting: Physician Assistant

## 2012-04-17 ENCOUNTER — Ambulatory Visit (INDEPENDENT_AMBULATORY_CARE_PROVIDER_SITE_OTHER): Payer: Medicare Other | Admitting: Physician Assistant

## 2012-04-17 VITALS — BP 125/88 | HR 80 | Ht 63.0 in | Wt 197.0 lb

## 2012-04-17 DIAGNOSIS — I471 Supraventricular tachycardia: Secondary | ICD-10-CM

## 2012-04-17 NOTE — Progress Notes (Signed)
Primary Cardiologist: Hillis Range, MD   HPI: Post hospital followup from [facility], following recent evaluation of CP, with history of PSVT, by our team in consultation. Serial cardiac markers were negative for ischemia, and a 2-D echo indicated normal LVF, with no focal WMAs. Dr. Dietrich Pates felt that the patient could not tolerate the combination of diltiazem and metoprolol, and recommended treatment with only one of these agents. He discontinued diltiazem and recommended continuing Lopressor at 25 twice a day.   Patient presents today reporting only one brief episode of palpitations (less than 10 minute duration), since her recent 3 hospitalization. Also, she just started wearing a 30 day event monitor, which have been ordered by Dr. Johney Frame at time of her last visit here on August 21. She did not receive any notification from the monitoring service.  Regarding her initial presenting symptoms, she recalls experiencing some anterior chest pressure and also noted a pulse of 42. This occurred a few hours after she had taken her first dose of diltiazem 360 mg, that morning. As instructed, she stopped taking Lopressor. Since her recent hospitalization, she has noted improvement in her baseline heart rate with pulse readings in the 60-70 range.  No Known Allergies  Current Outpatient Prescriptions  Medication Sig Dispense Refill  . aspirin EC 81 MG tablet Take 81 mg by mouth daily.      Marland Kitchen BIOTIN PO Take 5 capsules by mouth daily.      . clobetasol (TEMOVATE) 0.05 % cream Apply 1 application topically 2 (two) times daily.        Marland Kitchen glipiZIDE (GLUCOTROL) 10 MG tablet Take 10 mg by mouth 2 (two) times daily.        . hydrOXYzine (VISTARIL) 25 MG capsule Take 25 mg by mouth as needed.        . metFORMIN (GLUCOPHAGE) 850 MG tablet Take 850 mg by mouth 3 (three) times daily.        . metoprolol succinate (TOPROL-XL) 25 MG 24 hr tablet Take 1 tablet (25 mg total) by mouth at bedtime.      . Multiple Vitamin  (MULITIVITAMIN WITH MINERALS) TABS Take 1 tablet by mouth daily.      . Omega-3 Fatty Acids (FISH OIL) 1000 MG CAPS Take 1 capsule by mouth 3 (three) times daily.       Marland Kitchen omeprazole (PRILOSEC) 20 MG capsule Take 20 mg by mouth every other day.       . simvastatin (ZOCOR) 20 MG tablet Take 20 mg by mouth every evening.      . sitaGLIPtin (JANUVIA) 25 MG tablet Take 25 mg by mouth daily.      Marland Kitchen UNABLE TO FIND Apply 1 application topically daily as needed. Med Name: TRIAMCINOLONE + UNKNOWN MIX-Compounded by pharmacy        Past Medical History  Diagnosis Date  . SVT (supraventricular tachycardia)     no inducible arrhythmias by EP study 11/11  . Osteoarthritis     hands and knees  . Asthma   . DM (diabetes mellitus)     AO. no insulin; hemoglobin A1c of 8.4 in 2010.   Marland Kitchen GERD (gastroesophageal reflux disease)   . Depression   . HTN (hypertension)   . Hyperlipidemia     suboptimal profile in 9/10  . Lichen sclerosus     Past Surgical History  Procedure Date  . Cholecystectomy 2007  . Bilateral tubal ligation 1978  . Ep study 11/11    no inducible arrhythmias, no  ablation performed    History   Social History  . Marital Status: Married    Spouse Name: N/A    Number of Children: N/A  . Years of Education: N/A   Occupational History  . Not on file.   Social History Main Topics  . Smoking status: Former Games developer  . Smokeless tobacco: Never Used   Comment: quit in 1980's  . Alcohol Use: No     no excessive use  . Drug Use: No  . Sexually Active: Yes    Birth Control/ Protection: Post-menopausal   Other Topics Concern  . Not on file   Social History Narrative   Married, 3 children. Lives in [place], unemployed.    Social History Narrative   Married, 3 children. Lives in [place], unemployed.     Problem Relation Age of Onset  . Liver disease Neg Hx     also Neg hx of CRC    ROS: no nausea, vomiting; no fever, chills; no melena, hematochezia; no claudication  PHYSICAL  EXAM: BP 125/88  Pulse 80  Ht 5\' 3"  (1.6 m)  Wt 197 lb (89.359 kg)  BMI 34.90 kg/m2  SpO2 97% GENERAL: 61 year old female, moderately obese; NAD HEENT: NCAT, PERRLA, EOMI; sclera clear; no xanthelasma NECK: palpable bilateral carotid pulses, no bruits; no JVD; no TM LUNGS: CTA bilaterally CARDIAC: RRR (S1, S2); no significant murmurs; no rubs or gallops ABDOMEN: soft, non-tender; intact BS EXTREMETIES: no significant peripheral edema SKIN: warm/dry; no obvious rash/lesions MUSCULOSKELETAL: no joint deformity NEURO: no focal deficit; NL affect   EKG:    ASSESSMENT & PLAN:  Paroxysmal supraventricular tachycardia No further recommendations currently indicated. Patient was recently assessed by our team at [facility], at which time they recommended discontinuing high-dose diltiazem and reverting back to Lopressor. Since then, she has done quite well on low-dose Toprol 25 mg daily, save for one brief episode of tachycardia palpitations. She started wearing a 30 day event monitor earlier this week, per Dr. Jenel Lucks recent recommendation. She presents today with stable BP and pulse. Therefore, I instructed her to continue on her present medication regimen and to follow up with Dr. Johney Frame, for review of current event monitor results, as previously scheduled.    Gene Korey Arroyo, PAC

## 2012-04-17 NOTE — Patient Instructions (Addendum)
Your physician recommends that you schedule a follow-up appointment on 05/19/12 @10 :45am with Dr. Johney Frame at the [facility]. Office. [address]. [phone]. Your physician recommends that you continue on your current medications as directed. Please refer to the Current Medication list given to you today.

## 2012-04-17 NOTE — Assessment & Plan Note (Signed)
No further recommendations currently indicated. Patient was recently assessed by our team at [facility], at which time they recommended discontinuing high-dose diltiazem and reverting back to Lopressor. Since then, she has done quite well on low-dose Toprol 25 mg daily, save for one brief episode of tachycardia palpitations. She started wearing a 30 day event monitor earlier this week, per Dr. Jenel Lucks recent recommendation. She presents today with stable BP and pulse. Therefore, I instructed her to continue on her present medication regimen and to follow up with Dr. Johney Frame, for review of current event monitor results, as previously scheduled.

## 2012-04-28 ENCOUNTER — Telehealth: Payer: Self-pay | Admitting: Internal Medicine

## 2012-04-28 NOTE — Telephone Encounter (Signed)
Spokw with patient and let her know to call the company to get new patches and also to put Mallox on area prior to applying new patches

## 2012-04-28 NOTE — Telephone Encounter (Signed)
New Problem:    Patient called because the adhesive pads for her monitor is causing her to breakout.  Please call back.

## 2012-05-19 ENCOUNTER — Ambulatory Visit: Payer: Medicare Other | Admitting: Internal Medicine

## 2012-05-26 ENCOUNTER — Encounter: Payer: Self-pay | Admitting: Internal Medicine

## 2012-05-26 ENCOUNTER — Ambulatory Visit (INDEPENDENT_AMBULATORY_CARE_PROVIDER_SITE_OTHER): Payer: Medicare Other | Admitting: Internal Medicine

## 2012-05-26 VITALS — BP 140/78 | HR 84 | Ht 63.0 in | Wt 197.4 lb

## 2012-05-26 DIAGNOSIS — E785 Hyperlipidemia, unspecified: Secondary | ICD-10-CM

## 2012-05-26 DIAGNOSIS — R002 Palpitations: Secondary | ICD-10-CM

## 2012-05-26 DIAGNOSIS — I10 Essential (primary) hypertension: Secondary | ICD-10-CM

## 2012-05-26 LAB — BASIC METABOLIC PANEL
CO2: 26 mEq/L (ref 19–32)
GFR: 81.33 mL/min (ref >60.00)
Glucose, Bld: 112 mg/dL — ABNORMAL HIGH (ref 70–99)
Potassium: 4.1 mEq/L (ref 3.5–5.1)
Sodium: 141 mEq/L (ref 135–145)

## 2012-05-26 MED ORDER — LISINOPRIL 5 MG PO TABS: 5.0000 mg | ORAL_TABLET | Freq: Every day | ORAL | Status: DC

## 2012-05-26 NOTE — Patient Instructions (Addendum)
Your physician wants you to follow-up in: 6 months in [place] You will receive a reminder letter in the mail two months in advance. If you don't receive a letter, please call our office to schedule the follow-up appointment.   Your physician recommends that you return for lab work today    Your physician has recommended you make the following change in your medication:  1)Start Lisinopril 5mg  daily

## 2012-06-23 DIAGNOSIS — I10 Essential (primary) hypertension: Secondary | ICD-10-CM | POA: Insufficient documentation

## 2012-06-23 NOTE — Assessment & Plan Note (Signed)
Stable, lipid checks per PCP. Pt should have results forwarded to our office. No change required today

## 2012-06-23 NOTE — Progress Notes (Signed)
Emily Acosta, MD  The patient presents today for electrophysiology followup.  She recently wore an event monitor.  This did not document any arrhythmias.  Today, she denies symptoms of  chest pain, shortness of breath, orthopnea, PND, lower extremity edema, presyncope, syncope, or neurologic sequela.  The patient feels that she is tolerating medications without difficulties and is otherwise without complaint today.   Past Medical History  Diagnosis Date  . SVT (supraventricular tachycardia)     no inducible arrhythmias by EP study 11/11  . Osteoarthritis     hands and knees  . Asthma   . DM (diabetes mellitus)     AO. no insulin; hemoglobin A1c of 8.4 in 2010.   Marland Kitchen GERD (gastroesophageal reflux disease)   . Depression   . HTN (hypertension)   . Hyperlipidemia     suboptimal profile in 9/10  . Lichen sclerosus    Past Surgical History  Procedure Date  . Cholecystectomy 2007  . Bilateral tubal ligation 1978  . Ep study 11/11    no inducible arrhythmias, no ablation performed      No Known Allergies  History   Social History  . Marital Status: Married    Spouse Name: N/A    Number of Children: N/A  . Years of Education: N/A   Occupational History  . Not on file.   Social History Main Topics  . Smoking status: Former Games developer  . Smokeless tobacco: Never Used     Comment: quit in 1980's  . Alcohol Use: No     Comment: no excessive use  . Drug Use: No  . Sexually Active: Yes    Birth Control/ Protection: Post-menopausal   Other Topics Concern  . Not on file   Social History Narrative   Married, 3 children. Lives in [place], unemployed.     Family History  Problem Relation Age of Onset  . Liver disease Neg Hx     also Neg hx of CRC   Physical Exam: Vitals BP 140/78, HR 84, R 18, Wt 197 lbs  GEN- The patient is overweight appearing, alert and oriented x 3 today.   Head- normocephalic, atraumatic Eyes-  Sclera clear, conjunctiva pink Ears- hearing  intact Oropharynx- clear Neck- supple, no JVP Lymph- no cervical lymphadenopathy Lungs- Clear to ausculation bilaterally, normal work of breathing Heart- Regular rate and rhythm, no murmurs, rubs or gallops, PMI not laterally displaced GI- soft, NT, ND, + BS Extremities- no clubbing, cyanosis, or edema   ekg today reveals sinus rhythm 69 bpm,PR 126, QRS 94, Qtc 422, RsR'  Assessment and Plan:

## 2012-06-23 NOTE — Assessment & Plan Note (Signed)
No inducible arrhythmias at EP study 2011.  Recent event monitor was unrevealing.   No changes today.

## 2012-06-23 NOTE — Assessment & Plan Note (Signed)
Above goal Add lisinopril 5mg  daily Check bmet today Follow-up with PCP

## 2012-07-15 ENCOUNTER — Other Ambulatory Visit (HOSPITAL_COMMUNITY): Payer: Self-pay | Admitting: Family Medicine

## 2012-07-15 DIAGNOSIS — M810 Age-related osteoporosis without current pathological fracture: Secondary | ICD-10-CM

## 2012-07-21 ENCOUNTER — Other Ambulatory Visit (HOSPITAL_COMMUNITY): Payer: Medicare Other

## 2012-07-22 ENCOUNTER — Ambulatory Visit (HOSPITAL_COMMUNITY)
Admission: RE | Admit: 2012-07-22 | Discharge: 2012-07-22 | Disposition: A | Discharge status: Home / Self Care | Payer: Medicare Other | Source: Ambulatory Visit | Attending: Family Medicine | Admitting: Family Medicine

## 2012-07-22 DIAGNOSIS — M810 Age-related osteoporosis without current pathological fracture: Secondary | ICD-10-CM

## 2012-07-22 DIAGNOSIS — M899 Disorder of bone, unspecified: Secondary | ICD-10-CM | POA: Insufficient documentation

## 2012-09-16 ENCOUNTER — Other Ambulatory Visit: Payer: Self-pay | Admitting: Emergency Medicine

## 2012-09-16 NOTE — Telephone Encounter (Signed)
Please call the patient and see how she is taking the medication

## 2012-09-16 NOTE — Telephone Encounter (Signed)
Received refill request on metoprolol 25 mg bid from [facility].  Please advise

## 2012-09-17 ENCOUNTER — Other Ambulatory Visit: Payer: Self-pay

## 2012-09-17 MED ORDER — METOPROLOL SUCCINATE ER 25 MG PO TB24: 25.0000 mg | ORAL_TABLET | Freq: Every day | ORAL | Status: DC

## 2012-09-17 NOTE — Telephone Encounter (Signed)
S/w patient and she stated that she called in the wrong rx refill number and that she is only taking medication 25 mg daily.  rx sent to pharmacy

## 2012-09-17 NOTE — Telephone Encounter (Signed)
Tried calling but no answer.

## 2013-01-16 ENCOUNTER — Other Ambulatory Visit: Payer: Self-pay | Admitting: Emergency Medicine

## 2013-01-16 MED ORDER — LISINOPRIL 5 MG PO TABS: 5.0000 mg | ORAL_TABLET | Freq: Every day | ORAL | Status: DC

## 2013-04-24 ENCOUNTER — Other Ambulatory Visit (HOSPITAL_COMMUNITY): Payer: Self-pay | Admitting: Family Medicine

## 2013-04-24 DIAGNOSIS — Z139 Encounter for screening, unspecified: Secondary | ICD-10-CM

## 2013-04-30 ENCOUNTER — Ambulatory Visit (HOSPITAL_COMMUNITY)
Admission: RE | Admit: 2013-04-30 | Discharge: 2013-04-30 | Disposition: A | Discharge status: Home / Self Care | Payer: Medicare Other | Source: Ambulatory Visit | Attending: Family Medicine | Admitting: Family Medicine

## 2013-04-30 DIAGNOSIS — Z139 Encounter for screening, unspecified: Secondary | ICD-10-CM

## 2013-04-30 DIAGNOSIS — Z1231 Encounter for screening mammogram for malignant neoplasm of breast: Secondary | ICD-10-CM | POA: Insufficient documentation

## 2014-01-27 ENCOUNTER — Emergency Department (HOSPITAL_COMMUNITY)
Admission: EM | Admit: 2014-01-27 | Discharge: 2014-01-27 | Disposition: A | Discharge status: Home / Self Care | Payer: Medicare Other | Attending: Emergency Medicine | Admitting: Emergency Medicine

## 2014-01-27 ENCOUNTER — Encounter (HOSPITAL_COMMUNITY): Payer: Self-pay | Admitting: Emergency Medicine

## 2014-01-27 ENCOUNTER — Emergency Department (HOSPITAL_COMMUNITY): Payer: Medicare Other

## 2014-01-27 DIAGNOSIS — Z8619 Personal history of other infectious and parasitic diseases: Secondary | ICD-10-CM | POA: Insufficient documentation

## 2014-01-27 DIAGNOSIS — F329 Major depressive disorder, single episode, unspecified: Secondary | ICD-10-CM | POA: Insufficient documentation

## 2014-01-27 DIAGNOSIS — R000 Tachycardia, unspecified: Secondary | ICD-10-CM | POA: Insufficient documentation

## 2014-01-27 DIAGNOSIS — F3289 Other specified depressive episodes: Secondary | ICD-10-CM | POA: Insufficient documentation

## 2014-01-27 DIAGNOSIS — M19049 Primary osteoarthritis, unspecified hand: Secondary | ICD-10-CM | POA: Insufficient documentation

## 2014-01-27 DIAGNOSIS — J45901 Unspecified asthma with (acute) exacerbation: Secondary | ICD-10-CM | POA: Insufficient documentation

## 2014-01-27 DIAGNOSIS — M1710 Unilateral primary osteoarthritis, unspecified knee: Secondary | ICD-10-CM | POA: Insufficient documentation

## 2014-01-27 DIAGNOSIS — K219 Gastro-esophageal reflux disease without esophagitis: Secondary | ICD-10-CM | POA: Insufficient documentation

## 2014-01-27 DIAGNOSIS — E119 Type 2 diabetes mellitus without complications: Secondary | ICD-10-CM | POA: Insufficient documentation

## 2014-01-27 DIAGNOSIS — E785 Hyperlipidemia, unspecified: Secondary | ICD-10-CM | POA: Insufficient documentation

## 2014-01-27 DIAGNOSIS — I10 Essential (primary) hypertension: Secondary | ICD-10-CM | POA: Insufficient documentation

## 2014-01-27 HISTORY — DX: Bronchitis, not specified as acute or chronic: J40

## 2014-01-27 LAB — CBC WITH DIFFERENTIAL/PLATELET
BASOS ABS: 0 10*3/uL (ref 0.0–0.1)
BASOS PCT: 1 % (ref 0–1)
EOS PCT: 5 % (ref 0–5)
Eosinophils Absolute: 0.4 10*3/uL (ref 0.0–0.7)
HEMATOCRIT: 37 % (ref 36.0–46.0)
Hemoglobin: 12.3 g/dL (ref 12.0–15.0)
Lymphocytes Relative: 22 % (ref 12–46)
Lymphs Abs: 1.8 10*3/uL (ref 0.7–4.0)
MCH: 29.1 pg (ref 26.0–34.0)
MCHC: 33.2 g/dL (ref 30.0–36.0)
MCV: 87.5 fL (ref 78.0–100.0)
MONO ABS: 0.4 10*3/uL (ref 0.1–1.0)
Monocytes Relative: 6 % (ref 3–12)
NEUTROS ABS: 5.2 10*3/uL (ref 1.7–7.7)
Neutrophils Relative %: 66 % (ref 43–77)
PLATELETS: 273 10*3/uL (ref 150–400)
RBC: 4.23 MIL/uL (ref 3.87–5.11)
RDW: 13.7 % (ref 11.5–15.5)
WBC: 7.8 10*3/uL (ref 4.0–10.5)

## 2014-01-27 LAB — BASIC METABOLIC PANEL
BUN: 16 mg/dL (ref 6–23)
CALCIUM: 9.6 mg/dL (ref 8.4–10.5)
CO2: 23 mEq/L (ref 19–32)
CREATININE: 0.73 mg/dL (ref 0.50–1.10)
Chloride: 99 mEq/L (ref 96–112)
GFR calc non Af Amer: >90 mL/min (ref >90)
Glucose, Bld: 230 mg/dL — ABNORMAL HIGH (ref 70–99)
Potassium: 4.3 mEq/L (ref 3.7–5.3)
Sodium: 140 mEq/L (ref 137–147)

## 2014-01-27 LAB — TROPONIN I

## 2014-01-27 MED ORDER — PREDNISONE 50 MG PO TABS
60.0000 mg | ORAL_TABLET | Freq: Once | ORAL | Status: AC
  Administered 2014-01-27: 60 mg via ORAL
  Filled 2014-01-27 (×2): qty 1

## 2014-01-27 MED ORDER — PREDNISONE 20 MG PO TABS: 40.0000 mg | ORAL_TABLET | Freq: Every day | ORAL | Status: DC

## 2014-01-27 MED ORDER — HYDROGEN PEROXIDE 3 % EX SOLN
CUTANEOUS | Status: AC
  Filled 2014-01-27: qty 473

## 2014-01-27 MED ORDER — ALBUTEROL SULFATE (2.5 MG/3ML) 0.083% IN NEBU
2.5000 mg | INHALATION_SOLUTION | Freq: Once | RESPIRATORY_TRACT | Status: AC
  Administered 2014-01-27: 2.5 mg via RESPIRATORY_TRACT
  Filled 2014-01-27: qty 3

## 2014-01-27 MED ORDER — IPRATROPIUM-ALBUTEROL 0.5-2.5 (3) MG/3ML IN SOLN
3.0000 mL | Freq: Once | RESPIRATORY_TRACT | Status: AC
  Administered 2014-01-27: 3 mL via RESPIRATORY_TRACT
  Filled 2014-01-27: qty 3

## 2014-01-27 NOTE — ED Notes (Signed)
Pt ambulated to restroom room and back without difficulty.

## 2014-01-27 NOTE — Discharge Instructions (Signed)
Asthma, Adult Asthma is a recurring condition in which the airways tighten and narrow. Asthma can make it difficult to breathe. It can cause coughing, wheezing, and shortness of breath. Asthma episodes (also called asthma attacks) range from minor to life-threatening. Asthma cannot be cured, but medicines and lifestyle changes can help control it. CAUSES Asthma is believed to be caused by inherited (genetic) and environmental factors, but its exact cause is unknown. Asthma may be triggered by allergens, lung infections, or irritants in the air. Asthma triggers are different for each person. Common triggers include:   Animal dander.  Dust mites.  Cockroaches.  Pollen from trees or grass.  Mold.  Smoke.  Air pollutants such as dust, household cleaners, hair sprays, aerosol sprays, paint fumes, strong chemicals, or strong odors.  Cold air, weather changes, and winds (which increase molds and pollens in the air).  Strong emotional expressions such as crying or laughing hard.  Stress.  Certain medicines (such as aspirin) or types of drugs (such as beta-blockers).  Sulfites in foods and drinks. Foods and drinks that may contain sulfites include dried fruit, potato chips, and sparkling grape juice.  Infections or inflammatory conditions such as the flu, a cold, or an inflammation of the nasal membranes (rhinitis).  Gastroesophageal reflux disease (GERD).  Exercise or strenuous activity. SYMPTOMS Symptoms may occur immediately after asthma is triggered or many hours later. Symptoms include:  Wheezing.  Excessive nighttime or early morning coughing.  Frequent or severe coughing with a common cold.  Chest tightness.  Shortness of breath. DIAGNOSIS  The diagnosis of asthma is made by a review of your medical history and a physical exam. Tests may also be performed. These may include:  Lung function studies. These tests show how much air you breath in and out.  Allergy  tests.  Imaging tests such as X-rays. TREATMENT  Asthma cannot be cured, but it can usually be controlled. Treatment involves identifying and avoiding your asthma triggers. It also involves medicines. There are 2 classes of medicine used for asthma treatment:   Controller medicines. These prevent asthma symptoms from occurring. They are usually taken every day.  Reliever or rescue medicines. These quickly relieve asthma symptoms. They are used as needed and provide short-term relief. Your health care provider will help you create an asthma action plan. An asthma action plan is a written plan for managing and treating your asthma attacks. It includes a list of your asthma triggers and how they may be avoided. It also includes information on when medicines should be taken and when their dosage should be changed. An action plan may also involve the use of a device called a peak flow meter. A peak flow meter measures how well the lungs are working. It helps you monitor your condition. HOME CARE INSTRUCTIONS   Take medicine as directed by your health care provider. Speak with your health care provider if you have questions about how or when to take the medicines.  Use a peak flow meter as directed by your health care provider. Record and keep track of readings.  Understand and use the action plan to help minimize or stop an asthma attack without needing to seek medical care.  Control your home environment in the following ways to help prevent asthma attacks:  Do not smoke. Avoid being exposed to secondhand smoke.  Change your heating and air conditioning filter regularly.  Limit your use of fireplaces and wood stoves.  Get rid of pests (such as roaches and   mice) and their droppings.  Throw away plants if you see mold on them.  Clean your floors and dust regularly. Use unscented cleaning products.  Try to have someone else vacuum for you regularly. Stay out of rooms while they are being  vacuumed and for a short while afterward. If you vacuum, use a dust mask from a hardware store, a double-layered or microfilter vacuum cleaner bag, or a vacuum cleaner with a HEPA filter.  Replace carpet with wood, tile, or vinyl flooring. Carpet can trap dander and dust.  Use allergy-proof pillows, mattress covers, and box spring covers.  Wash bed sheets and blankets every week in hot water and dry them in a dryer.  Use blankets that are made of polyester or cotton.  Clean bathrooms and kitchens with bleach. If possible, have someone repaint the walls in these rooms with mold-resistant paint. Keep out of the rooms that are being cleaned and painted.  Wash hands frequently. SEEK MEDICAL CARE IF:   You have wheezing, shortness of breath, or a cough even if taking medicine to prevent attacks.  The colored mucus you cough up (sputum) is thicker than usual.  Your sputum changes from clear or white to yellow, green, gray, or bloody.  You have any problems that may be related to the medicines you are taking (such as a rash, itching, swelling, or trouble breathing).  You are using a reliever medicine more than 2 3 times per week.  Your peak flow is still at 50 79% of you personal best after following your action plan for 1 hour. SEEK IMMEDIATE MEDICAL CARE IF:   You seem to be getting worse and are unresponsive to treatment during an asthma attack.  You are short of breath even at rest.  You get short of breath when doing very little physical activity.  You have difficulty eating, drinking, or talking due to asthma symptoms.  You develop chest pain.  You develop a fast heartbeat.  You have a bluish color to your lips or fingernails.  You are lightheaded, dizzy, or faint.  Your peak flow is less than 50% of your personal best.  You have a fever or persistent symptoms for more than 2 3 days.  You have a fever and symptoms suddenly get worse. MAKE SURE YOU:   Understand these  instructions.  Will watch your condition.  Will get help right away if you are not doing well or get worse. Document Released: 08/06/2005 Document Revised: 04/08/2013 Document Reviewed: 03/05/2013 Exit[employer] Patient Information 2014 [employer].  

## 2014-01-27 NOTE — ED Notes (Signed)
Pt here for SOB, tachycardia and CP today, bronchitis 2 weeks ago and has had and finished a Z-pack

## 2014-01-27 NOTE — ED Provider Notes (Signed)
CSN: [id number]     Arrival date & time 01/27/14  1439 History  This chart was scribed for Sharyon Cable, MD by Jeanell Sparrow, ED Scribe. This patient was seen in room APA03/APA03 and the patient's care was started at 3:31 PM.  Chief Complaint  Patient presents with  . Shortness of Breath  . Tachycardia    Patient is a 63 y.o. female presenting with shortness of breath. The history is provided by the patient and a relative. No language interpreter was used.  Shortness of Breath Severity:  Severe Onset quality:  Gradual Duration:  7 days Timing:  Constant Progression:  Worsening Chronicity:  Chronic Relieved by:  None tried Worsened by:  Nothing tried Ineffective treatments:  None tried Associated symptoms: chest pain, cough and wheezing   Associated symptoms: no vomiting    HPI Comments: [name] is a 63 y.o. female who presents to the Emergency Department complaining of intermittent severe SOB that started a weeks ago. Pt states that she has been sick this past week and has been stressed. She also reports associated cough that is productive of yellow sputum. She reports that there are no modifying factors. She states that she has chest pain with cough. She denies any leg swelling. She also denies any emesis or diarrhea.  No h/o CHF/PE/CAD  Past Medical History  Diagnosis Date  . SVT (supraventricular tachycardia)     no inducible arrhythmias by EP study 11/11  . Osteoarthritis     hands and knees  . Asthma   . DM (diabetes mellitus)     AO. no insulin; hemoglobin A1c of 8.4 in 2010.   Marland Kitchen GERD (gastroesophageal reflux disease)   . Depression   . HTN (hypertension)   . Hyperlipidemia     suboptimal profile in 9/10  . Lichen sclerosus   . Bronchitis    Past Surgical History  Procedure Laterality Date  . Cholecystectomy  2007  . Bilateral tubal ligation  1978  . Ep study  11/11    no inducible arrhythmias, no ablation performed   Family History  Problem  Relation Age of Onset  . Liver disease Neg Hx     also Neg hx of CRC   History  Substance Use Topics  . Smoking status: Former Research scientist (life sciences)  . Smokeless tobacco: Never Used     Comment: quit in 1980's  . Alcohol Use: No     Comment: no excessive use   OB History   Grav Para Term Preterm Abortions TAB SAB Ect Mult Living                 Review of Systems  Respiratory: Positive for cough, shortness of breath and wheezing.   Cardiovascular: Positive for chest pain. Negative for leg swelling.  Gastrointestinal: Negative for vomiting and diarrhea.  All other systems reviewed and are negative.     Allergies  Review of patient's allergies indicates no known allergies.  Home Medications    BP 137/44  Pulse 83  Temp(Src) 98.2 F (36.8 C) (Oral)  Resp 18  Ht 5\' 2"  (1.575 m)  Wt 194 lb (87.998 kg)  BMI 35.47 kg/m2  SpO2 95% Physical Exam CONSTITUTIONAL: Well developed/well nourished HEAD: Normocephalic/atraumatic EYES: EOMI/PERRL ENMT: Mucous membranes moist NECK: supple no meningeal signs SPINE:entire spine nontender CV: S1/S2 noted, no murmurs/rubs/gallops noted LUNGS: scattered wheezing bilaterally, harsh cough noted ABDOMEN: soft, nontender, no rebound or guarding GU:no cva tenderness NEURO: Pt is awake/alert, moves all extremitiesx4 EXTREMITIES:  pulses normal, full ROM, no edema SKIN: warm, color normal PSYCH: no abnormalities of mood noted   ED Course  Procedures  DIAGNOSTIC STUDIES: Oxygen Saturation is 95% on RA, normal by my interpretation.    COORDINATION OF CARE: 3:37 PM- Pt advised of plan for treatment and pt agrees.  5:35 PM Pt improved Ambulatory Wheeze resolved She reports cough for over a week, but no hemoptysis, CP only with cough I doubt ACS/PE/CHF at this time With h/o asthma will give steroid burst Advised of need for check of glucose frequently due to steroids and h/o diabetes Labs Review Labs Reviewed  BASIC METABOLIC PANEL - Abnormal;  Notable for the following:    Glucose, Bld 230 (*)    All other components within normal limits  TROPONIN I  CBC WITH DIFFERENTIAL    Imaging Review Dg Chest Portable 1 View  01/27/2014   CLINICAL DATA:  Cough and chest congestion and shortness of breath. Tachycardia.  EXAM: PORTABLE CHEST - 1 VIEW  COMPARISON:  03/21/2012  FINDINGS: The heart size and mediastinal contours are within normal limits. Both lungs are clear. The visualized skeletal structures are unremarkable.  IMPRESSION: Normal exam.   Electronically Signed   By: Rozetta Nunnery M.D.   On: 01/27/2014 15:24     Date: 01/27/2014  Rate: 75  Rhythm: normal sinus rhythm  QRS Axis: normal  Intervals: normal  ST/T Wave abnormalities: nonspecific ST changes  Conduction Disutrbances:none     MDM   Final diagnoses:  Asthma attack    Nursing notes including past medical history and social history reviewed and considered in documentation xrays reviewed and considered Labs/vital reviewed and considered  I personally performed the services described in this documentation, which was scribed in my presence. The recorded information has been reviewed and is accurate.       Sharyon Cable, MD 01/27/14 [phone]

## 2014-06-21 ENCOUNTER — Other Ambulatory Visit (HOSPITAL_COMMUNITY): Payer: Self-pay | Admitting: Family Medicine

## 2014-06-21 DIAGNOSIS — Z1231 Encounter for screening mammogram for malignant neoplasm of breast: Secondary | ICD-10-CM

## 2014-07-01 ENCOUNTER — Ambulatory Visit (HOSPITAL_COMMUNITY): Payer: Medicare Other

## 2014-07-08 ENCOUNTER — Ambulatory Visit (HOSPITAL_COMMUNITY)
Admission: RE | Admit: 2014-07-08 | Discharge: 2014-07-08 | Disposition: A | Discharge status: Home / Self Care | Payer: Medicare Other | Source: Ambulatory Visit | Attending: Family Medicine | Admitting: Family Medicine

## 2014-07-08 ENCOUNTER — Other Ambulatory Visit (HOSPITAL_COMMUNITY): Payer: Self-pay | Admitting: Family Medicine

## 2014-07-08 DIAGNOSIS — R52 Pain, unspecified: Secondary | ICD-10-CM

## 2014-07-08 DIAGNOSIS — Z87891 Personal history of nicotine dependence: Secondary | ICD-10-CM | POA: Diagnosis not present

## 2014-07-08 DIAGNOSIS — R928 Other abnormal and inconclusive findings on diagnostic imaging of breast: Secondary | ICD-10-CM | POA: Insufficient documentation

## 2014-07-08 DIAGNOSIS — I10 Essential (primary) hypertension: Secondary | ICD-10-CM | POA: Insufficient documentation

## 2014-07-08 DIAGNOSIS — J45909 Unspecified asthma, uncomplicated: Secondary | ICD-10-CM | POA: Diagnosis not present

## 2014-07-08 DIAGNOSIS — Z1231 Encounter for screening mammogram for malignant neoplasm of breast: Secondary | ICD-10-CM | POA: Diagnosis present

## 2014-07-08 DIAGNOSIS — M79644 Pain in right finger(s): Secondary | ICD-10-CM | POA: Insufficient documentation

## 2014-07-08 DIAGNOSIS — M19041 Primary osteoarthritis, right hand: Secondary | ICD-10-CM | POA: Insufficient documentation

## 2014-07-09 ENCOUNTER — Other Ambulatory Visit: Payer: Self-pay | Admitting: Family Medicine

## 2014-07-09 DIAGNOSIS — R928 Other abnormal and inconclusive findings on diagnostic imaging of breast: Secondary | ICD-10-CM

## 2014-08-03 ENCOUNTER — Ambulatory Visit (HOSPITAL_COMMUNITY)
Admission: RE | Admit: 2014-08-03 | Discharge: 2014-08-03 | Disposition: A | Discharge status: Home / Self Care | Payer: Medicare Other | Source: Ambulatory Visit | Attending: Family Medicine | Admitting: Family Medicine

## 2014-08-03 DIAGNOSIS — R928 Other abnormal and inconclusive findings on diagnostic imaging of breast: Secondary | ICD-10-CM

## 2014-08-03 DIAGNOSIS — Z1231 Encounter for screening mammogram for malignant neoplasm of breast: Secondary | ICD-10-CM | POA: Diagnosis not present

## 2015-08-21 ENCOUNTER — Emergency Department (HOSPITAL_COMMUNITY)
Admission: EM | Admit: 2015-08-21 | Discharge: 2015-08-21 | Disposition: A | Discharge status: Home / Self Care | Payer: PPO | Attending: Emergency Medicine | Admitting: Emergency Medicine

## 2015-08-21 ENCOUNTER — Encounter (HOSPITAL_COMMUNITY): Payer: Self-pay | Admitting: Emergency Medicine

## 2015-08-21 DIAGNOSIS — S50811A Abrasion of right forearm, initial encounter: Secondary | ICD-10-CM | POA: Insufficient documentation

## 2015-08-21 DIAGNOSIS — Z8659 Personal history of other mental and behavioral disorders: Secondary | ICD-10-CM | POA: Insufficient documentation

## 2015-08-21 DIAGNOSIS — E785 Hyperlipidemia, unspecified: Secondary | ICD-10-CM | POA: Insufficient documentation

## 2015-08-21 DIAGNOSIS — J45909 Unspecified asthma, uncomplicated: Secondary | ICD-10-CM | POA: Insufficient documentation

## 2015-08-21 DIAGNOSIS — Y9289 Other specified places as the place of occurrence of the external cause: Secondary | ICD-10-CM | POA: Insufficient documentation

## 2015-08-21 DIAGNOSIS — Z87891 Personal history of nicotine dependence: Secondary | ICD-10-CM | POA: Insufficient documentation

## 2015-08-21 DIAGNOSIS — Z7982 Long term (current) use of aspirin: Secondary | ICD-10-CM | POA: Insufficient documentation

## 2015-08-21 DIAGNOSIS — Z7984 Long term (current) use of oral hypoglycemic drugs: Secondary | ICD-10-CM | POA: Insufficient documentation

## 2015-08-21 DIAGNOSIS — W231XXA Caught, crushed, jammed, or pinched between stationary objects, initial encounter: Secondary | ICD-10-CM | POA: Insufficient documentation

## 2015-08-21 DIAGNOSIS — K219 Gastro-esophageal reflux disease without esophagitis: Secondary | ICD-10-CM | POA: Insufficient documentation

## 2015-08-21 DIAGNOSIS — M179 Osteoarthritis of knee, unspecified: Secondary | ICD-10-CM | POA: Insufficient documentation

## 2015-08-21 DIAGNOSIS — Y9389 Activity, other specified: Secondary | ICD-10-CM | POA: Insufficient documentation

## 2015-08-21 DIAGNOSIS — Z872 Personal history of diseases of the skin and subcutaneous tissue: Secondary | ICD-10-CM | POA: Insufficient documentation

## 2015-08-21 DIAGNOSIS — E119 Type 2 diabetes mellitus without complications: Secondary | ICD-10-CM | POA: Insufficient documentation

## 2015-08-21 DIAGNOSIS — Y998 Other external cause status: Secondary | ICD-10-CM | POA: Insufficient documentation

## 2015-08-21 DIAGNOSIS — I10 Essential (primary) hypertension: Secondary | ICD-10-CM | POA: Insufficient documentation

## 2015-08-21 DIAGNOSIS — Z7952 Long term (current) use of systemic steroids: Secondary | ICD-10-CM | POA: Insufficient documentation

## 2015-08-21 DIAGNOSIS — Z79899 Other long term (current) drug therapy: Secondary | ICD-10-CM | POA: Insufficient documentation

## 2015-08-21 DIAGNOSIS — M19049 Primary osteoarthritis, unspecified hand: Secondary | ICD-10-CM | POA: Insufficient documentation

## 2015-08-21 DIAGNOSIS — S40811A Abrasion of right upper arm, initial encounter: Secondary | ICD-10-CM

## 2015-08-21 DIAGNOSIS — Z7951 Long term (current) use of inhaled steroids: Secondary | ICD-10-CM | POA: Insufficient documentation

## 2015-08-21 NOTE — ED Notes (Signed)
Pt was waiting in waiting room with her husband and went to use vending machine, Chips got caught at the hinge, pt reached in to get chips and door closed down on her right forearm, broke the skin and red. Pt is diabetic.

## 2015-08-21 NOTE — Discharge Instructions (Signed)
Please cleanse the abrasion to the right forearm with soap and water daily, apply a Band-Aid until the wound has healed. Please see your primary physician Dr. Karie Kirks, if any signs of advancing infection. Abrasion An abrasion is a cut or scrape on the surface of your skin. An abrasion does not go through all of the layers of your skin. It is important to take good care of your abrasion to prevent infection. HOME CARE Medicines  Take or apply medicines only as told by your doctor.  If you were prescribed an antibiotic ointment, finish all of it even if you start to feel better. Wound Care  Clean the wound with mild soap and water 2-3 times per day or as told by your doctor. Pat your wound dry with a clean towel. Do not rub it.  There are many ways to close and cover a wound. Follow instructions from your doctor about:  How to take care of your wound.  When and how you should change your bandage (dressing).  When and how you should take off your dressing.  Check your wound every day for signs of infection. Watch for:  Redness, swelling, or pain.  Fluid, blood, or pus. General Instructions  Keep the dressing dry as told by your doctor. Do not take baths, swim, use a hot tub, or do anything that would put your wound underwater until your doctor says it is okay.  If there is swelling, raise (elevate) the injured area above the level of your heart while you are sitting or lying down.  Keep all follow-up visits as told by your doctor. This is important. GET HELP IF:  You were given a tetanus shot and you have any of these where the needle went in:  Swelling.  Very bad pain.  Redness.  Bleeding.  Medicine does not help your pain.  You have any of these at the site of the wound:  More redness.  More swelling.  More pain. GET HELP RIGHT AWAY IF:  You have a red streak going away from your wound.  You have a fever.  You have fluid, blood, or pus coming from your  wound.  There is a bad smell coming from your wound.   This information is not intended to replace advice given to you by your health care provider. Make sure you discuss any questions you have with your health care provider.   Document Released: 01/23/2008 Document Revised: 12/21/2014 Document Reviewed: 08/04/2014 Elsevier Interactive Patient Education [employer].

## 2015-08-21 NOTE — ED Provider Notes (Signed)
CSN: [id number]     Arrival date & time 08/21/15  1926 History   First MD Initiated Contact with Patient 08/21/15 2022     Chief Complaint  Patient presents with  . Arm Injury     (Consider location/radiation/quality/duration/timing/severity/associated sxs/prior Treatment) HPI Comments: Patient is a 65 year old female who presents to the emergency department with injury to the right arm.  The patient was waiting in the waiting room of the [facility] emergency department. While using the vending machine, a package of chips, and caught in the machine. The patient states she reached in to get the chips within the door to dispensing area closed down on the right forearm. The patient noted a break in the skin and some increased redness present. There are no other injuries reported. It is of note that the patient is a diabetic. Patient states she is up-to-date on her tetanus status.  Patient is a 65 y.o. female presenting with arm injury. The history is provided by the patient.  Arm Injury Location:  Arm Arm location:  R forearm   Past Medical History  Diagnosis Date  . SVT (supraventricular tachycardia) (HCC)     no inducible arrhythmias by EP study 11/11  . Osteoarthritis     hands and knees  . Asthma   . DM (diabetes mellitus) ([place])     AO. no insulin; hemoglobin A1c of 8.4 in 2010.   Marland Kitchen GERD (gastroesophageal reflux disease)   . Depression   . HTN (hypertension)   . Hyperlipidemia     suboptimal profile in 9/10  . Lichen sclerosus   . Bronchitis    Past Surgical History  Procedure Laterality Date  . Cholecystectomy  2007  . Bilateral tubal ligation  1978  . Ep study  11/11    no inducible arrhythmias, no ablation performed   Family History  Problem Relation Age of Onset  . Liver disease Neg Hx     also Neg hx of CRC   Social History  Substance Use Topics  . Smoking status: Former Research scientist (life sciences)  . Smokeless tobacco: Never Used     Comment: quit in 1980's  . Alcohol Use: No      Comment: no excessive use   OB History    No data available     Review of Systems  Musculoskeletal: Positive for arthralgias.  Skin:       abrasion  All other systems reviewed and are negative.     Allergies  Review of patient's allergies indicates no known allergies.  Home Medications   Prior to Admission medications   Medication Sig Start Date End Date Taking? Authorizing Provider  albuterol (PROAIR HFA) 108 (90 BASE) MCG/ACT inhaler Inhale 1 puff into the lungs every 6 (six) hours as needed for wheezing or shortness of breath.   Yes Historical Provider, MD  Artificial Tear Ointment (AKWA TEARS OP) Place 1 drop into both eyes every 4 (four) hours.   Yes Historical Provider, MD  aspirin EC 81 MG tablet Take 81 mg by mouth at bedtime.    Yes Historical Provider, MD  B-Complex TABS Take 1 tablet by mouth daily.   Yes Historical Provider, MD  Biotin 5000 MCG CAPS Take 1 capsule by mouth 2 (two) times daily.   Yes Historical Provider, MD  budesonide-formoterol (SYMBICORT) 160-4.5 MCG/ACT inhaler Inhale 2 puffs into the lungs 2 (two) times daily.   Yes Historical Provider, MD  Calcium-Magnesium-Vitamin D 200-100-33.3 MG-MG-UNIT CAPS Take 1 capsule by mouth daily.  Yes Historical Provider, MD  canagliflozin (INVOKANA) 100 MG TABS tablet Take 200 mg by mouth daily before breakfast.   Yes Historical Provider, MD  clobetasol (TEMOVATE) 0.05 % cream Apply 1 application topically 2 (two) times daily.    Yes Historical Provider, MD  glipiZIDE (GLUCOTROL) 10 MG tablet Take 10 mg by mouth 2 (two) times daily.     Yes Historical Provider, MD  lisinopril (PRINIVIL,ZESTRIL) 5 MG tablet Take 1 tablet (5 mg total) by mouth daily. 01/16/13  Yes Thompson Grayer, MD  metFORMIN (GLUCOPHAGE) 850 MG tablet Take 850 mg by mouth 3 (three) times daily.     Yes Historical Provider, MD  metoprolol succinate (TOPROL-XL) 25 MG 24 hr tablet Take 25 mg by mouth at bedtime.   Yes Historical Provider, MD  Multiple  Vitamin (MULITIVITAMIN WITH MINERALS) TABS Take 1 tablet by mouth daily.   Yes Historical Provider, MD  Omega-3 Fatty Acids (FISH OIL) 1000 MG CAPS Take 1 capsule by mouth 2 (two) times daily.    Yes Historical Provider, MD  omeprazole (PRILOSEC) 20 MG capsule Take 20 mg by mouth every other day.    Yes Historical Provider, MD  PRESCRIPTION MEDICATION Take 1 tablet by mouth 2 (two) times daily. Patient is doing a blind study with BELVIQ, is not sure if she is taking the medication or the placebo.   Yes Historical Provider, MD  simvastatin (ZOCOR) 20 MG tablet Take 20 mg by mouth every evening.   Yes Historical Provider, MD  tacrolimus (PROTOPIC) 0.1 % ointment Apply 1 application topically 2 (two) times daily.   Yes Historical Provider, MD  LORazepam (ATIVAN) 0.5 MG tablet Take 0.5 mg by mouth every 8 (eight) hours as needed for anxiety or sleep.  01/22/14   Historical Provider, MD  metoprolol succinate (TOPROL-XL) 25 MG 24 hr tablet Take 1 tablet (25 mg total) by mouth at bedtime. 09/16/12 09/16/13  Thompson Grayer, MD  predniSONE (DELTASONE) 20 MG tablet Take 2 tablets (40 mg total) by mouth daily. 01/27/14   Ripley Fraise, MD   BP 121/68 mmHg  Pulse 74  Temp(Src) 98.3 F (36.8 C) (Oral)  Resp 18  Ht '5\' 2"'$  (1.575 m)  Wt 79.833 kg  BMI 32.18 kg/m2  SpO2 98% Physical Exam  Constitutional: She is oriented to person, place, and time. She appears well-developed and well-nourished.  Non-toxic appearance.  HENT:  Head: Normocephalic.  Right Ear: Tympanic membrane and external ear normal.  Left Ear: Tympanic membrane and external ear normal.  Eyes: EOM and lids are normal. Pupils are equal, round, and reactive to light.  Neck: Normal range of motion. Neck supple. Carotid bruit is not present.  Cardiovascular: Normal rate, regular rhythm, normal heart sounds, intact distal pulses and normal pulses.   Pulmonary/Chest: Breath sounds normal. No respiratory distress.  Abdominal: Soft. Bowel sounds are  normal. There is no tenderness. There is no guarding.  Musculoskeletal: Normal range of motion.       Right forearm: She exhibits no bony tenderness, no swelling and no deformity.  There is a shallow abrasion of the radial aspect of the right forearm. No active bleeding. There is full range of motion of the fingers, wrists, elbow, and shoulder on the right without problem.  Lymphadenopathy:       Head (right side): No submandibular adenopathy present.       Head (left side): No submandibular adenopathy present.    She has no cervical adenopathy.  Neurological: She is alert and oriented  to person, place, and time. She has normal strength. No cranial nerve deficit or sensory deficit.  Skin: Skin is warm and dry.  Psychiatric: She has a normal mood and affect. Her speech is normal.  Nursing note and vitals reviewed.   ED Course  Procedures (including critical care time) Labs Review Labs Reviewed - No data to display  Imaging Review No results found. I have personally reviewed and evaluated these images and lab results as part of my medical decision-making.   EKG Interpretation None      MDM  Patient was attempting to get chips out of a vending machine when the dispensing door came down on her right forearm. She sustained a shallow abrasion present. She was concerned because of her diabetic status. There is no bone or tendon involvement time. The patient's tetanus status is up-to-date. Bandage applied to the abrasion area. Patient is to see Dr. Karie Kirks for follow-up if any signs of advancing infection.    Final diagnoses:  None    **I have reviewed nursing notes, vital signs, and all appropriate lab and imaging results for this patient.Lily Kocher, PA-C 08/21/15 2046  Daleen Bo, MD 08/22/15 1122

## 2015-08-21 NOTE — ED Notes (Signed)
Coverlet placed to abrasion on Rt Forearm.

## 2015-08-26 ENCOUNTER — Other Ambulatory Visit: Payer: Self-pay

## 2015-08-26 ENCOUNTER — Emergency Department (HOSPITAL_COMMUNITY)
Admission: EM | Admit: 2015-08-26 | Discharge: 2015-08-26 | Disposition: A | Discharge status: Home / Self Care | Payer: PPO | Attending: Emergency Medicine | Admitting: Emergency Medicine

## 2015-08-26 ENCOUNTER — Encounter (HOSPITAL_COMMUNITY): Payer: Self-pay | Admitting: Emergency Medicine

## 2015-08-26 ENCOUNTER — Emergency Department (HOSPITAL_COMMUNITY): Payer: PPO

## 2015-08-26 DIAGNOSIS — Z7982 Long term (current) use of aspirin: Secondary | ICD-10-CM | POA: Diagnosis not present

## 2015-08-26 DIAGNOSIS — Z7952 Long term (current) use of systemic steroids: Secondary | ICD-10-CM | POA: Diagnosis not present

## 2015-08-26 DIAGNOSIS — J45901 Unspecified asthma with (acute) exacerbation: Secondary | ICD-10-CM | POA: Insufficient documentation

## 2015-08-26 DIAGNOSIS — Z7951 Long term (current) use of inhaled steroids: Secondary | ICD-10-CM | POA: Insufficient documentation

## 2015-08-26 DIAGNOSIS — R0602 Shortness of breath: Secondary | ICD-10-CM | POA: Diagnosis present

## 2015-08-26 DIAGNOSIS — F329 Major depressive disorder, single episode, unspecified: Secondary | ICD-10-CM | POA: Insufficient documentation

## 2015-08-26 DIAGNOSIS — M1990 Unspecified osteoarthritis, unspecified site: Secondary | ICD-10-CM | POA: Insufficient documentation

## 2015-08-26 DIAGNOSIS — Z79899 Other long term (current) drug therapy: Secondary | ICD-10-CM | POA: Insufficient documentation

## 2015-08-26 DIAGNOSIS — E119 Type 2 diabetes mellitus without complications: Secondary | ICD-10-CM | POA: Insufficient documentation

## 2015-08-26 DIAGNOSIS — Z87891 Personal history of nicotine dependence: Secondary | ICD-10-CM | POA: Insufficient documentation

## 2015-08-26 DIAGNOSIS — K219 Gastro-esophageal reflux disease without esophagitis: Secondary | ICD-10-CM | POA: Insufficient documentation

## 2015-08-26 DIAGNOSIS — I10 Essential (primary) hypertension: Secondary | ICD-10-CM | POA: Insufficient documentation

## 2015-08-26 DIAGNOSIS — E785 Hyperlipidemia, unspecified: Secondary | ICD-10-CM | POA: Diagnosis not present

## 2015-08-26 DIAGNOSIS — J40 Bronchitis, not specified as acute or chronic: Secondary | ICD-10-CM

## 2015-08-26 DIAGNOSIS — J9801 Acute bronchospasm: Secondary | ICD-10-CM

## 2015-08-26 MED ORDER — PREDNISONE 10 MG PO TABS: 60.0000 mg | ORAL_TABLET | Freq: Every day | ORAL | Status: DC

## 2015-08-26 MED ORDER — BENZONATATE 100 MG PO CAPS: 100.0000 mg | ORAL_CAPSULE | Freq: Three times a day (TID) | ORAL | Status: DC

## 2015-08-26 MED ORDER — DOXYCYCLINE HYCLATE 100 MG PO CAPS: 100.0000 mg | ORAL_CAPSULE | Freq: Two times a day (BID) | ORAL | Status: DC

## 2015-08-26 MED ORDER — IPRATROPIUM-ALBUTEROL 0.5-2.5 (3) MG/3ML IN SOLN
3.0000 mL | Freq: Once | RESPIRATORY_TRACT | Status: AC
  Administered 2015-08-26: 3 mL via RESPIRATORY_TRACT
  Filled 2015-08-26: qty 3

## 2015-08-26 MED ORDER — DOXYCYCLINE HYCLATE 100 MG PO TABS
100.0000 mg | ORAL_TABLET | Freq: Once | ORAL | Status: AC
  Administered 2015-08-26: 100 mg via ORAL
  Filled 2015-08-26: qty 1

## 2015-08-26 MED ORDER — ALBUTEROL SULFATE (2.5 MG/3ML) 0.083% IN NEBU
2.5000 mg | INHALATION_SOLUTION | Freq: Once | RESPIRATORY_TRACT | Status: AC
Start: 2015-08-26 — End: 2015-08-26
  Administered 2015-08-26: 2.5 mg via RESPIRATORY_TRACT
  Filled 2015-08-26: qty 3

## 2015-08-26 MED ORDER — PREDNISONE 10 MG PO TABS
60.0000 mg | ORAL_TABLET | Freq: Once | ORAL | Status: AC
  Administered 2015-08-26: 60 mg via ORAL
  Filled 2015-08-26: qty 1

## 2015-08-26 MED ORDER — IPRATROPIUM BROMIDE 0.02 % IN SOLN: 0.5000 mg | Freq: Once | RESPIRATORY_TRACT | Status: DC

## 2015-08-26 MED ORDER — ALBUTEROL SULFATE (2.5 MG/3ML) 0.083% IN NEBU: 5.0000 mg | INHALATION_SOLUTION | Freq: Once | RESPIRATORY_TRACT | Status: DC

## 2015-08-26 NOTE — Discharge Instructions (Signed)
Bronchospasm, Adult A bronchospasm is a spasm or tightening of the airways going into the lungs. During a bronchospasm breathing becomes more difficult because the airways get smaller. When this happens there can be coughing, a whistling sound when breathing (wheezing), and difficulty breathing. Bronchospasm is often associated with asthma, but not all patients who experience a bronchospasm have asthma. CAUSES  A bronchospasm is caused by inflammation or irritation of the airways. The inflammation or irritation may be triggered by:   Allergies (such as to animals, pollen, food, or mold). Allergens that cause bronchospasm may cause wheezing immediately after exposure or many hours later.   Infection. Viral infections are believed to be the most common cause of bronchospasm.   Exercise.   Irritants (such as pollution, cigarette smoke, strong odors, aerosol sprays, and paint fumes).   Weather changes. Winds increase molds and pollens in the air. Rain refreshes the air by washing irritants out. Cold air may cause inflammation.   Stress and emotional upset.  SIGNS AND SYMPTOMS   Wheezing.   Excessive nighttime coughing.   Frequent or severe coughing with a simple cold.   Chest tightness.   Shortness of breath.  DIAGNOSIS  Bronchospasm is usually diagnosed through a history and physical exam. Tests, such as chest X-rays, are sometimes done to look for other conditions. TREATMENT   Inhaled medicines can be given to open up your airways and help you breathe. The medicines can be given using either an inhaler or a nebulizer machine.  Corticosteroid medicines may be given for severe bronchospasm, usually when it is associated with asthma. HOME CARE INSTRUCTIONS   Always have a plan prepared for seeking medical care. Know when to call your health care provider and local emergency services (911 in the [place] Know where you can access local emergency care.  Only take medicines as  directed by your health care provider.  If you were prescribed an inhaler or nebulizer machine, ask your health care provider to explain how to use it correctly. Always use a spacer with your inhaler if you were given one.  It is necessary to remain calm during an attack. Try to relax and breathe more slowly.  Control your home environment in the following ways:   Change your heating and air conditioning filter at least once a month.   Limit your use of fireplaces and wood stoves.  Do not smoke and do not allow smoking in your home.   Avoid exposure to perfumes and fragrances.   Get rid of pests (such as roaches and mice) and their droppings.   Throw away plants if you see mold on them.   Keep your house clean and dust free.   Replace carpet with wood, tile, or vinyl flooring. Carpet can trap dander and dust.   Use allergy-proof pillows, mattress covers, and box spring covers.   Wash bed sheets and blankets every week in hot water and dry them in a dryer.   Use blankets that are made of polyester or cotton.   Wash hands frequently. SEEK MEDICAL CARE IF:   You have muscle aches.   You have chest pain.   The sputum changes from clear or white to yellow, green, gray, or bloody.   The sputum you cough up gets thicker.   There are problems that may be related to the medicine you are given, such as a rash, itching, swelling, or trouble breathing.  SEEK IMMEDIATE MEDICAL CARE IF:   You have worsening wheezing and coughing  even after taking your prescribed medicines.   You have increased difficulty breathing.   You develop severe chest pain. MAKE SURE YOU:   Understand these instructions.  Will watch your condition.  Will get help right away if you are not doing well or get worse.   This information is not intended to replace advice given to you by your health care provider. Make sure you discuss any questions you have with your health care  provider.   Document Released: 08/09/2003 Document Revised: 08/27/2014 Document Reviewed: 01/26/2013 Elsevier Interactive Patient Education [employer].

## 2015-08-26 NOTE — ED Provider Notes (Signed)
CSN: [id number]     Arrival date & time 08/26/15  1237 History  By signing my name below, I, Meriel Pica, attest that this documentation has been prepared under the direction and in the presence of Jola Schmidt, MD. Electronically Signed: Meriel Pica, ED Scribe. 08/26/2015. 1:04 PM.   Chief Complaint  Patient presents with  . Shortness of Breath   The history is provided by the patient. No language interpreter was used.   HPI Comments: [name] is a 65 y.o. female, with a PMhx of asthma, who presents to the Emergency Department complaining of a productive cough with chest congestion that has been persistent for 2 weeks despite a tapered course of prednisone and tessalon pearls. Pt reports she received an Xray of her chest while at [facility] last week that did not indicate an antibiotic course. She has a h/o asthma and has been using her albuterol inhaler without significant relief. She associates SOB. Denies orthopnea, fevers or chills.   Past Medical History  Diagnosis Date  . SVT (supraventricular tachycardia) (HCC)     no inducible arrhythmias by EP study 11/11  . Osteoarthritis     hands and knees  . Asthma   . DM (diabetes mellitus) ([place])     AO. no insulin; hemoglobin A1c of 8.4 in 2010.   Marland Kitchen GERD (gastroesophageal reflux disease)   . Depression   . HTN (hypertension)   . Hyperlipidemia     suboptimal profile in 9/10  . Lichen sclerosus   . Bronchitis    Past Surgical History  Procedure Laterality Date  . Cholecystectomy  2007  . Bilateral tubal ligation  1978  . Ep study  11/11    no inducible arrhythmias, no ablation performed   Family History  Problem Relation Age of Onset  . Liver disease Neg Hx     also Neg hx of CRC   Social History  Substance Use Topics  . Smoking status: Former Research scientist (life sciences)  . Smokeless tobacco: Never Used     Comment: quit in 1980's  . Alcohol Use: No     Comment: no excessive use   OB History    No data available     Review  of Systems  Constitutional: Negative for fever and chills.  HENT: Positive for congestion ( chest).   Respiratory: Positive for cough and shortness of breath.    Allergies  Review of patient's allergies indicates no known allergies.  Home Medications   Prior to Admission medications   Medication Sig Start Date End Date Taking? Authorizing Provider  albuterol (PROAIR HFA) 108 (90 BASE) MCG/ACT inhaler Inhale 1 puff into the lungs every 6 (six) hours as needed for wheezing or shortness of breath.    Historical Provider, MD  Artificial Tear Ointment (AKWA TEARS OP) Place 1 drop into both eyes every 4 (four) hours.    Historical Provider, MD  aspirin EC 81 MG tablet Take 81 mg by mouth at bedtime.     Historical Provider, MD  B-Complex TABS Take 1 tablet by mouth daily.    Historical Provider, MD  Biotin 5000 MCG CAPS Take 1 capsule by mouth 2 (two) times daily.    Historical Provider, MD  budesonide-formoterol (SYMBICORT) 160-4.5 MCG/ACT inhaler Inhale 2 puffs into the lungs 2 (two) times daily.    Historical Provider, MD  Calcium-Magnesium-Vitamin D 200-100-33.3 MG-MG-UNIT CAPS Take 1 capsule by mouth daily.    Historical Provider, MD  canagliflozin (INVOKANA) 100 MG TABS tablet Take 200  mg by mouth daily before breakfast.    Historical Provider, MD  clobetasol (TEMOVATE) 0.05 % cream Apply 1 application topically 2 (two) times daily.     Historical Provider, MD  glipiZIDE (GLUCOTROL) 10 MG tablet Take 10 mg by mouth 2 (two) times daily.      Historical Provider, MD  lisinopril (PRINIVIL,ZESTRIL) 5 MG tablet Take 1 tablet (5 mg total) by mouth daily. 01/16/13   Thompson Grayer, MD  LORazepam (ATIVAN) 0.5 MG tablet Take 0.5 mg by mouth every 8 (eight) hours as needed for anxiety or sleep.  01/22/14   Historical Provider, MD  metFORMIN (GLUCOPHAGE) 850 MG tablet Take 850 mg by mouth 3 (three) times daily.      Historical Provider, MD  metoprolol succinate (TOPROL-XL) 25 MG 24 hr tablet Take 1 tablet  (25 mg total) by mouth at bedtime. 09/16/12 09/16/13  Thompson Grayer, MD  metoprolol succinate (TOPROL-XL) 25 MG 24 hr tablet Take 25 mg by mouth at bedtime.    Historical Provider, MD  Multiple Vitamin (MULITIVITAMIN WITH MINERALS) TABS Take 1 tablet by mouth daily.    Historical Provider, MD  Omega-3 Fatty Acids (FISH OIL) 1000 MG CAPS Take 1 capsule by mouth 2 (two) times daily.     Historical Provider, MD  omeprazole (PRILOSEC) 20 MG capsule Take 20 mg by mouth every other day.     Historical Provider, MD  predniSONE (DELTASONE) 20 MG tablet Take 2 tablets (40 mg total) by mouth daily. 01/27/14   Ripley Fraise, MD  PRESCRIPTION MEDICATION Take 1 tablet by mouth 2 (two) times daily. Patient is doing a blind study with BELVIQ, is not sure if she is taking the medication or the placebo.    Historical Provider, MD  simvastatin (ZOCOR) 20 MG tablet Take 20 mg by mouth every evening.    Historical Provider, MD  tacrolimus (PROTOPIC) 0.1 % ointment Apply 1 application topically 2 (two) times daily.    Historical Provider, MD   BP 118/60 mmHg  Pulse 82  Temp(Src) 98.2 F (36.8 C) (Oral)  Resp 18  SpO2 95% Physical Exam  Constitutional: She is oriented to person, place, and time. She appears well-developed and well-nourished. No distress.  HENT:  Head: Normocephalic and atraumatic.  Eyes: EOM are normal.  Neck: Normal range of motion.  Cardiovascular: Normal rate, regular rhythm and normal heart sounds.   Pulmonary/Chest: Effort normal and breath sounds normal.  Abdominal: Soft. She exhibits no distension. There is no tenderness.  Musculoskeletal: Normal range of motion.  Neurological: She is alert and oriented to person, place, and time.  Skin: Skin is warm and dry.  Psychiatric: She has a normal mood and affect. Judgment normal.  Nursing note and vitals reviewed.   ED Course  Procedures  DIAGNOSTIC STUDIES: Oxygen Saturation is 95% on RA, adequate by my interpretation.    COORDINATION  OF CARE: 1:03 PM Discussed treatment plan which includes to order breathing treatment and steroids with pt. Will order chest Xray. Pt acknowledges and agrees to plan.   Labs Review Labs Reviewed - No data to display  Imaging Review Dg Chest 2 View  08/26/2015  CLINICAL DATA:  Cough and short of breath. EXAM: CHEST  2 VIEW COMPARISON:  08/10/2015 FINDINGS: The heart size and mediastinal contours are within normal limits. Both lungs are clear. The visualized skeletal structures are unremarkable. IMPRESSION: No active cardiopulmonary disease. Electronically Signed   By: Franchot Gallo M.D.   On: 08/26/2015 14:05   I  have personally reviewed and evaluated these images and lab results as part of my medical decision-making.  ECG interpretation   Date: 08/26/2015  Rate: 84  Rhythm: normal sinus rhythm  QRS Axis: normal  Intervals: normal  ST/T Wave abnormalities: normal  Conduction Disutrbances: none  Narrative Interpretation:   Old EKG Reviewed: No significant changes noted     MDM   Final diagnoses:  None    2:21 PM Patient feels much better after nebulized albuterol emergency department.  Home with steroids, doxycycline, every 4 hour bronchodilators.  She understands to follow-up with her primary care physician and return to the ER for any new or worsening symptoms.  Tessalon Perles for cough.  I personally performed the services described in this documentation, which was scribed in my presence. The recorded information has been reviewed and is accurate.       Jola Schmidt, MD 08/26/15 [phone]

## 2015-08-26 NOTE — ED Notes (Signed)
Pt reports trouble breathing since this morning having to use rescue inhaler 3 times.  Pt reports cough since christmas. Pt alert and oriented.

## 2016-01-24 ENCOUNTER — Ambulatory Visit (INDEPENDENT_AMBULATORY_CARE_PROVIDER_SITE_OTHER): Payer: PPO | Admitting: Cardiology

## 2016-01-24 ENCOUNTER — Encounter: Payer: Self-pay | Admitting: Cardiology

## 2016-01-24 VITALS — BP 128/80 | HR 73 | Ht 62.0 in | Wt 185.0 lb

## 2016-01-24 DIAGNOSIS — E119 Type 2 diabetes mellitus without complications: Secondary | ICD-10-CM | POA: Diagnosis not present

## 2016-01-24 DIAGNOSIS — I10 Essential (primary) hypertension: Secondary | ICD-10-CM

## 2016-01-24 DIAGNOSIS — E785 Hyperlipidemia, unspecified: Secondary | ICD-10-CM

## 2016-01-24 DIAGNOSIS — I471 Supraventricular tachycardia: Secondary | ICD-10-CM | POA: Diagnosis not present

## 2016-01-24 MED ORDER — METOPROLOL SUCCINATE ER 25 MG PO TB24: ORAL_TABLET | ORAL | Status: DC

## 2016-01-24 NOTE — Patient Instructions (Signed)
Your physician wants you to follow-up in:  6 months You will receive a reminder letter in the mail two months in advance. If you don't receive a letter, please call our office to schedule the follow-up appointment.     INCREASE Toprol XL to 25 mg in the am, and 1/2 tablet (12.5 mg) at bedtime      If you need a refill on your cardiac medications before your next appointment, please call your pharmacy.     Thank you for choosing [facility] !

## 2016-01-24 NOTE — Progress Notes (Signed)
Cardiology Office Note  Date: 01/24/2016   ID: JILLANE PO, DOB 12-16-1950, MRN [id number]  PCP: Robert Bellow, MD  Consulting Cardiologist: Rozann Lesches, MD   Chief Complaint  Patient presents with  . History of SVT    History of Present Illness: [name] is a 65 y.o. female patient previously evaluated by Dr. Rayann Heman for SVT, last seen in the office in 2013. She has been managed medically for years following no inducible arrhythmia at EP study in 2011. She presents describing a two-month history of recurring palpitations, generally brief, not lasting more than a minute. She has been under a lot of stress with the death of both parents over the last year, her son is also living at home with her having lost his job. She has been trying to do some walking to lose weight and control her diabetes.  I reviewed her medications which are outlined below. She has continued on Toprol-XL at 25 mg daily long-term. ECG today shows normal sinus rhythm with small R' in lead V1 and V2.  Past Medical History  Diagnosis Date  . SVT (supraventricular tachycardia) (HCC)     No inducible arrhythmias by EP study 11/11  . Osteoarthritis   . Asthma   . Type 2 diabetes mellitus ([place])   . GERD (gastroesophageal reflux disease)   . Depression   . Essential hypertension   . Hyperlipidemia   . Lichen sclerosus   . Bronchitis   . Anxiety     Past Surgical History  Procedure Laterality Date  . Cholecystectomy  2007  . Cesarean section with bilateral tubal ligation  1978  . Ep study  11/11    Current Outpatient Prescriptions  Medication Sig Dispense Refill  . acetaminophen (TYLENOL) 500 MG tablet Take 500 mg by mouth every 6 (six) hours as needed.    Marland Kitchen albuterol (PROAIR HFA) 108 (90 BASE) MCG/ACT inhaler Inhale 1 puff into the lungs every 6 (six) hours as needed for wheezing or shortness of breath.    Marland Kitchen aspirin EC 81 MG tablet Take 81 mg by mouth at bedtime.     Marland Kitchen B-Complex TABS  Take 1 tablet by mouth daily.    . Biotin 5000 MCG CAPS Take 1 capsule by mouth 2 (two) times daily.    . budesonide-formoterol (SYMBICORT) 160-4.5 MCG/ACT inhaler Inhale 2 puffs into the lungs 2 (two) times daily.    . Calcium-Magnesium-Vitamin D 200-100-33.3 MG-MG-UNIT CAPS Take 1 capsule by mouth daily.    . clobetasol (TEMOVATE) 0.05 % cream Apply 1 application topically 2 (two) times daily.     . DHA-EPA-Flaxseed Oil-Vitamin E (THERA TEARS NUTRITION PO) Take 1 drop by mouth daily.    Marland Kitchen glipiZIDE (GLUCOTROL) 10 MG tablet Take 10 mg by mouth 2 (two) times daily.      Marland Kitchen lisinopril (PRINIVIL,ZESTRIL) 5 MG tablet Take 1 tablet (5 mg total) by mouth daily. 90 tablet 3  . LORazepam (ATIVAN) 0.5 MG tablet Take 0.5 mg by mouth every 8 (eight) hours as needed for anxiety or sleep.     . metFORMIN (GLUCOPHAGE) 850 MG tablet Take 850 mg by mouth 3 (three) times daily.      . Multiple Vitamin (MULITIVITAMIN WITH MINERALS) TABS Take 1 tablet by mouth daily.    . Omega-3 Fatty Acids (FISH OIL) 1000 MG CAPS Take 1 capsule by mouth 2 (two) times daily.     Marland Kitchen omeprazole (PRILOSEC) 20 MG capsule Take 20 mg by mouth every  other day.     . predniSONE (DELTASONE) 10 MG tablet Take 6 tablets (60 mg total) by mouth daily. 30 tablet 0  . simvastatin (ZOCOR) 20 MG tablet Take 20 mg by mouth every evening.    . tacrolimus (PROTOPIC) 0.1 % ointment Apply 1 application topically 2 (two) times daily.    . metoprolol succinate (TOPROL-XL) 25 MG 24 hr tablet Take 1 tablet (25 mg total) by mouth at bedtime.     No current facility-administered medications for this visit.   Allergies:  Review of patient's allergies indicates no known allergies.   Social History: The patient  reports that she has quit smoking. Her smoking use included Cigarettes. She has never used smokeless tobacco. She reports that she drinks alcohol. She reports that she does not use illicit drugs.   Family History: The patient's family history  includes COPD in her father; Hypertension in her father and mother; Parkinson's disease in her father.   ROS:  Please see the history of present illness. Otherwise, complete review of systems is positive for stress, bronchitis earlier in the year.  All other systems are reviewed and negative.   Physical Exam: VS:  BP 128/80 mmHg  Pulse 73  Ht '5\' 2"'$  (1.575 m)  Wt 185 lb (83.915 kg)  BMI 33.83 kg/m2  SpO2 95%, BMI Body mass index is 33.83 kg/(m^2).  Wt Readings from Last 3 Encounters:  01/24/16 185 lb (83.915 kg)  08/21/15 176 lb (79.833 kg)  01/27/14 194 lb (87.998 kg)    General: Obese, short statured woman, appears comfortable at rest. HEENT: Conjunctiva and lids normal, oropharynx clear. Neck: Supple, no elevated JVP or carotid bruits, no thyromegaly. Lungs: Clear to auscultation, nonlabored breathing at rest. Cardiac: Regular rate and rhythm, no S3 or significant systolic murmur, no pericardial rub. Abdomen: Soft, nontender, bowel sounds present, no guarding or rebound. Extremities: No pitting edema, distal pulses 2+. Skin: Warm and dry. Musculoskeletal: No kyphosis. Neuropsychiatric: Alert and oriented x3, affect grossly appropriate.  ECG: I personally reviewed the tracing from 01/24/2016 which showed sinus rhythm with small R' in lead V1 and V2.  Recent Labwork:  February 2017: Cholesterol 151, HDL 37, triglycerides 205, LDL 73, BUN 18, creatinine 0.7, potassium 4.5, AST 9, ALT 14, hemoglobin A1c 8.2, hemoglobin 13.6, platelets 221 May 2017: BUN 15, creatinine 0.6, potassium 4.2, hemoglobin A1c 7.7  Other Studies Reviewed Today:  Echocardiogram 04/11/2012: Study Conclusions  - Left ventricle: The cavity size was normal. Wall thickness was normal. Systolic function was normal. The estimated ejection fraction was in the range of 55% to 60%. Wall motion was normal; there were no regional wall motion abnormalities. - Aortic valve: Mildly calcified annulus.  Trileaflet; normal thickness leaflets. - Mitral valve: Calcified annulus. Minimally calcified leaflets .  Assessment and Plan:  1. Previously defined history of PSVT. She has had a flare of palpitations recently in the setting of psychosocial stress. Reports compliance with her regular medications. We will increase Toprol-XL to 25 mg in the morning and 12.5 mg in the evening, can titrate further if needed. Follow-up arranged.  2. Essential hypertension, blood pressure is well controlled today.  3. Type 2 diabetes mellitus, recent hemoglobin A1c 7.7, followed by Dr. Karie Kirks.  4. Hyperlipidemia, LDL 73 in February, on Zocor and omega-3 supplement.  Current medicines were reviewed with the patient today.   Orders Placed This Encounter  Procedures  . EKG 12-Lead    Disposition: FU with me in 6 months.   Signed,  Satira Sark, MD, [facility] 01/24/2016 10:52 AM    [facility]. [address], [place], [place] [address] Phone: [phone]; Fax: [phone]

## 2016-02-23 ENCOUNTER — Emergency Department (HOSPITAL_COMMUNITY): Payer: PPO

## 2016-02-23 ENCOUNTER — Encounter (HOSPITAL_COMMUNITY): Payer: Self-pay | Admitting: *Deleted

## 2016-02-23 ENCOUNTER — Emergency Department (HOSPITAL_COMMUNITY)
Admission: EM | Admit: 2016-02-23 | Discharge: 2016-02-24 | Disposition: A | Discharge status: Home / Self Care | Payer: PPO | Attending: Emergency Medicine | Admitting: Emergency Medicine

## 2016-02-23 DIAGNOSIS — F329 Major depressive disorder, single episode, unspecified: Secondary | ICD-10-CM | POA: Insufficient documentation

## 2016-02-23 DIAGNOSIS — Z87891 Personal history of nicotine dependence: Secondary | ICD-10-CM | POA: Insufficient documentation

## 2016-02-23 DIAGNOSIS — E785 Hyperlipidemia, unspecified: Secondary | ICD-10-CM | POA: Insufficient documentation

## 2016-02-23 DIAGNOSIS — Z7984 Long term (current) use of oral hypoglycemic drugs: Secondary | ICD-10-CM | POA: Diagnosis not present

## 2016-02-23 DIAGNOSIS — M25562 Pain in left knee: Secondary | ICD-10-CM | POA: Insufficient documentation

## 2016-02-23 DIAGNOSIS — E119 Type 2 diabetes mellitus without complications: Secondary | ICD-10-CM | POA: Insufficient documentation

## 2016-02-23 DIAGNOSIS — Z7982 Long term (current) use of aspirin: Secondary | ICD-10-CM | POA: Diagnosis not present

## 2016-02-23 DIAGNOSIS — I10 Essential (primary) hypertension: Secondary | ICD-10-CM | POA: Diagnosis not present

## 2016-02-23 DIAGNOSIS — Z79899 Other long term (current) drug therapy: Secondary | ICD-10-CM | POA: Diagnosis not present

## 2016-02-23 DIAGNOSIS — J45909 Unspecified asthma, uncomplicated: Secondary | ICD-10-CM | POA: Insufficient documentation

## 2016-02-23 NOTE — ED Provider Notes (Signed)
CSN: [id number]     Arrival date & time 02/23/16  2301 History  By signing my name below, I, Dora Sims, attest that this documentation has been prepared under the direction and in the presence of physician practitioner, Delora Fuel, MD. Electronically Signed: Dora Sims, Scribe. 02/23/2016. 11:59 PM.     Chief Complaint  Patient presents with  . Knee Pain    The history is provided by the patient. No language interpreter was used.     HPI Comments: [name] is a 65 y.o. female with PMHx including osteoarthritis and DM type II who presents to the Emergency Department complaining of sudden onset, constant, worsening, 7.5, left knee pain and swelling for the last week. Pt reports that she has been overexerting herself recently. Pt states that her knee has been "buckling to the outside." She endorses significant pain exacerbation with standing and weight bearing and notes pain alleviation with sitting and rest. Pt has also applied ice to her left knee with moderate relief of her pain and swelling; she has tried Tylenol Extra Strength with no relief. She notes difficulty sleeping due left knee pain. Pt has been ambulating with a cane since onset. She notes that she experienced minor left knee problems several years ago when she lived in an upstairs apartment and regularly ambulated up and down stairs. Pt states she has seen an orthopedist in the past for arthritis in her hands but cannot recall their name. Pt denies recent falls or injuries to her left knee. She further denies numbness, weakness, or any other associated symptoms.  PCP: Dr. Karie Kirks  Past Medical History  Diagnosis Date  . SVT (supraventricular tachycardia) (HCC)     No inducible arrhythmias by EP study 11/11  . Osteoarthritis   . Asthma   . Type 2 diabetes mellitus ([place])   . GERD (gastroesophageal reflux disease)   . Depression   . Essential hypertension   . Hyperlipidemia   . Lichen sclerosus   . Bronchitis   .  Anxiety    Past Surgical History  Procedure Laterality Date  . Cholecystectomy  2007  . Cesarean section with bilateral tubal ligation  1978  . Ep study  11/11   Family History  Problem Relation Age of Onset  . Hypertension Father   . Hypertension Mother   . COPD Father   . Parkinson's disease Father    Social History  Substance Use Topics  . Smoking status: Former Smoker    Types: Cigarettes  . Smokeless tobacco: Never Used     Comment: quit in 1980's  . Alcohol Use: No   OB History    No data available     Review of Systems  Musculoskeletal: Positive for joint swelling (left knee) and arthralgias (left knee).  Neurological: Negative for weakness and numbness.  All other systems reviewed and are negative.   Allergies  Review of patient's allergies indicates no known allergies.  Home Medications   Prior to Admission medications   Medication Sig Start Date End Date Taking? Authorizing Provider  acetaminophen (TYLENOL) 500 MG tablet Take 500 mg by mouth every 6 (six) hours as needed.   Yes Historical Provider, MD  albuterol (PROAIR HFA) 108 (90 BASE) MCG/ACT inhaler Inhale 1 puff into the lungs every 6 (six) hours as needed for wheezing or shortness of breath.   Yes Historical Provider, MD  aspirin EC 81 MG tablet Take 81 mg by mouth at bedtime.    Yes Historical Provider, MD  B-Complex TABS Take 1 tablet by mouth daily.   Yes Historical Provider, MD  Biotin 5000 MCG CAPS Take 1 capsule by mouth 2 (two) times daily.   Yes Historical Provider, MD  budesonide-formoterol (SYMBICORT) 160-4.5 MCG/ACT inhaler Inhale 2 puffs into the lungs 2 (two) times daily.   Yes Historical Provider, MD  Calcium-Magnesium-Vitamin D 200-100-33.3 MG-MG-UNIT CAPS Take 1 capsule by mouth daily.   Yes Historical Provider, MD  clobetasol (TEMOVATE) 0.05 % cream Apply 1 application topically 2 (two) times daily.    Yes Historical Provider, MD  DHA-EPA-Flaxseed Oil-Vitamin E (THERA TEARS NUTRITION  PO) Take 1 drop by mouth daily.   Yes Historical Provider, MD  glipiZIDE (GLUCOTROL) 10 MG tablet Take 10 mg by mouth 2 (two) times daily.     Yes Historical Provider, MD  lisinopril (PRINIVIL,ZESTRIL) 5 MG tablet Take 1 tablet (5 mg total) by mouth daily. 01/16/13  Yes Thompson Grayer, MD  LORazepam (ATIVAN) 0.5 MG tablet Take 0.5 mg by mouth every 8 (eight) hours as needed for anxiety or sleep.  01/22/14  Yes Historical Provider, MD  metFORMIN (GLUCOPHAGE) 850 MG tablet Take 850 mg by mouth 3 (three) times daily.     Yes Historical Provider, MD  metoprolol succinate (TOPROL XL) 25 MG 24 hr tablet Take 1 tablet (25 mg) in the am, and 1/2 tablet (12.5 mg) at bedtime 01/24/16  Yes Satira Sark, MD  Multiple Vitamin (MULITIVITAMIN WITH MINERALS) TABS Take 1 tablet by mouth daily.   Yes Historical Provider, MD  Omega-3 Fatty Acids (FISH OIL) 1000 MG CAPS Take 1 capsule by mouth 2 (two) times daily.    Yes Historical Provider, MD  omeprazole (PRILOSEC) 20 MG capsule Take 20 mg by mouth every other day.    Yes Historical Provider, MD  predniSONE (DELTASONE) 10 MG tablet Take 6 tablets (60 mg total) by mouth daily. 08/26/15  Yes Jola Schmidt, MD  simvastatin (ZOCOR) 20 MG tablet Take 20 mg by mouth every evening.   Yes Historical Provider, MD  tacrolimus (PROTOPIC) 0.1 % ointment Apply 1 application topically 2 (two) times daily.   Yes Historical Provider, MD   BP 115/68 mmHg  Pulse 73  Temp(Src) 98.4 F (36.9 C) (Oral)  Resp 20  Ht '5\' 2"'$  (1.575 m)  Wt 182 lb (82.555 kg)  BMI 33.28 kg/m2  SpO2 98% Physical Exam  Constitutional: She is oriented to person, place, and time. She appears well-developed and well-nourished. No distress.  HENT:  Head: Normocephalic and atraumatic.  Eyes: Conjunctivae and EOM are normal. Pupils are equal, round, and reactive to light.  Neck: Normal range of motion. Neck supple. No JVD present.  Cardiovascular: Normal rate, regular rhythm and normal heart sounds.   No murmur  heard. Pulmonary/Chest: Effort normal and breath sounds normal. She has no wheezes. She has no rales. She exhibits no tenderness.  Abdominal: Soft. Bowel sounds are normal. She exhibits no distension and no mass. There is no tenderness.  Musculoskeletal: Normal range of motion.  Lower extremities: trace edema bilaterally. Left knee has small effusion with moderate tenderness on the lateral joint line. Left knee: pain elicited with valgus and varus stress with more pain elicited with varus stress. Left knee: mild laxity on valgus and varus stress, but symmetric when compared with the right knee.  Lymphadenopathy:    She has no cervical adenopathy.  Neurological: She is alert and oriented to person, place, and time. No cranial nerve deficit. She exhibits normal muscle tone. Coordination  normal.  Skin: Skin is warm and dry. No rash noted.  Psychiatric: She has a normal mood and affect. Her behavior is normal. Judgment and thought content normal.  Nursing note and vitals reviewed.   ED Course  Procedures (including critical care time)  DIAGNOSTIC STUDIES: Oxygen Saturation is 98% on RA, normal by my interpretation.    COORDINATION OF CARE: 11:59 PM Discussed treatment plan with pt at bedside and pt agreed to plan.  Imaging Review Dg Knee Complete 4 Views Left  02/24/2016  CLINICAL DATA:  Left knee pain after twisting injury 1 week prior. EXAM: LEFT KNEE - COMPLETE 4+ VIEW COMPARISON:  Radiograph 03/03/2012 FINDINGS: No fracture or subluxation. Mild tricompartmental peripheral spurring. No joint effusion. No focal soft tissue abnormality. IMPRESSION: No acute bony abnormality.  Mild degenerative change. Electronically Signed   By: Jeb Levering M.D.   On: 02/24/2016 00:38   I have personally reviewed and evaluated these images and lab results as part of my medical decision-making.   MDM   Final diagnoses:  Pain in left knee    Left knee pain with mild tenderness and small effusion.  Overall picture seems most consistent with meniscus tear. X-rays of the knee show mild arthritic changes which are not unexpected for her age. She is placed in a knee immobilizer for comfort and referred to orthopedics for follow-up. Given a prescription for naproxen. She has crutches at home which she is to use as needed.  I personally performed the services described in this documentation, which was scribed in my presence. The recorded information has been reviewed and is accurate.     Delora Fuel, MD 93/90/30 0923

## 2016-02-23 NOTE — ED Notes (Signed)
Pt reports twisting her left knee a week & has not had it checked. Pt has borrowed a cane to help her get around.

## 2016-02-24 MED ORDER — NAPROXEN 375 MG PO TABS: 375.0000 mg | ORAL_TABLET | Freq: Two times a day (BID) | ORAL | Status: DC

## 2016-02-24 NOTE — Discharge Instructions (Signed)
Wear the knee immobilizer as needed.  Knee Pain Knee pain is a very common symptom and can have many causes. Knee pain often goes away when you follow your health care provider's instructions for relieving pain and discomfort at home. However, knee pain can develop into a condition that needs treatment. Some conditions may include:  Arthritis caused by wear and tear (osteoarthritis).  Arthritis caused by swelling and irritation (rheumatoid arthritis or gout).  A cyst or growth in your knee.  An infection in your knee joint.  An injury that will not heal.  Damage, swelling, or irritation of the tissues that support your knee (torn ligaments or tendinitis). If your knee pain continues, additional tests may be ordered to diagnose your condition. Tests may include X-rays or other imaging studies of your knee. You may also need to have fluid removed from your knee. Treatment for ongoing knee pain depends on the cause, but treatment may include:  Medicines to relieve pain or swelling.  Steroid injections in your knee.  Physical therapy.  Surgery. HOME CARE INSTRUCTIONS  Take medicines only as directed by your health care provider.  Rest your knee and keep it raised (elevated) while you are resting.  Do not do things that cause or worsen pain.  Avoid high-impact activities or exercises, such as running, jumping rope, or doing jumping jacks.  Apply ice to the knee area:  Put ice in a plastic bag.  Place a towel between your skin and the bag.  Leave the ice on for 20 minutes, 2-3 times a day.  Ask your health care provider if you should wear an elastic knee support.  Keep a pillow under your knee when you sleep.  Lose weight if you are overweight. Extra weight can put pressure on your knee.  Do not use any tobacco products, including cigarettes, chewing tobacco, or electronic cigarettes. If you need help quitting, ask your health care provider. Smoking may slow the healing of  any bone and joint problems that you may have. SEEK MEDICAL CARE IF:  Your knee pain continues, changes, or gets worse.  You have a fever along with knee pain.  Your knee buckles or locks up.  Your knee becomes more swollen. SEEK IMMEDIATE MEDICAL CARE IF:   Your knee joint feels hot to the touch.  You have chest pain or trouble breathing.   This information is not intended to replace advice given to you by your health care provider. Make sure you discuss any questions you have with your health care provider.   Document Released: 06/03/2007 Document Revised: 08/27/2014 Document Reviewed: 03/22/2014 Elsevier Interactive Patient Education 2016 [place] A knee immobilizer is used to support and protect an injured or painful knee. Knee immobilizers keep your knee from being used while it is healing. Some of the common immobilizers used include splints (air, plaster, fiberglass, stiff cloth, or aluminum) or casts. Wear your knee immobilizer as instructed and only remove it as instructed. HOME CARE INSTRUCTIONS   Use absorbent powder (such as baby powder or talcum powder) to control irritation from sweat and friction.  Adjust the immobilizer to be firm but not tight. Signs of an immobilizer that is too tight include:  Swelling.  Numbness.  Color change in your foot or ankle.  Increased pain.  While resting, raise your leg above the level of your heart. Pillows can be used for support. This reduces throbbing and helps healing.  Remove the immobilizer to bathe and sleep.  SEEK MEDICAL CARE IF:   You have increasing pain or swelling in the knee, foot, or ankle.  You have problems caused by the knee immobilizer, or it breaks or needs replacement. MAKE SURE YOU:   Understand these instructions.  Will watch your condition.  Will get help right away if you are not doing well or get worse.   This information is not intended to replace advice given to you  by your health care provider. Make sure you discuss any questions you have with your health care provider.   Document Released: 08/06/2005 Document Revised: 08/27/2014 Document Reviewed: 03/30/2013 Elsevier Interactive Patient Education 2016 [employer]  Naproxen and naproxen sodium oral immediate-release tablets What is this medicine? NAPROXEN (na PROX en) is a non-steroidal anti-inflammatory drug (NSAID). It is used to reduce swelling and to treat pain. This medicine may be used for dental pain, headache, or painful monthly periods. It is also used for painful joint and muscular problems such as arthritis, tendinitis, bursitis, and gout. This medicine may be used for other purposes; ask your health care provider or pharmacist if you have questions. What should I tell my health care provider before I take this medicine? They need to know if you have any of these conditions: -asthma -cigarette smoker -drink more than 3 alcohol containing drinks a day -heart disease or circulation problems such as heart failure or leg edema (fluid retention) -high blood pressure -kidney disease -liver disease -stomach bleeding or ulcers -an unusual or allergic reaction to naproxen, aspirin, other NSAIDs, other medicines, foods, dyes, or preservatives -pregnant or trying to get pregnant -breast-feeding How should I use this medicine? Take this medicine by mouth with a glass of water. Follow the directions on the prescription label. Take it with food if your stomach gets upset. Try to not lie down for at least 10 minutes after you take it. Take your medicine at regular intervals. Do not take your medicine more often than directed. Long-term, continuous use may increase the risk of heart attack or stroke. A special MedGuide will be given to you by the pharmacist with each prescription and refill. Be sure to read this information carefully each time. Talk to your pediatrician regarding the use of this medicine  in children. Special care may be needed. Overdosage: If you think you have taken too much of this medicine contact a poison control center or emergency room at once. NOTE: This medicine is only for you. Do not share this medicine with others. What if I miss a dose? If you miss a dose, take it as soon as you can. If it is almost time for your next dose, take only that dose. Do not take double or extra doses. What may interact with this medicine? -alcohol -aspirin -cidofovir -diuretics -lithium -methotrexate -other drugs for inflammation like ketorolac or prednisone -pemetrexed -probenecid -warfarin This list may not describe all possible interactions. Give your health care provider a list of all the medicines, herbs, non-prescription drugs, or dietary supplements you use. Also tell them if you smoke, drink alcohol, or use illegal drugs. Some items may interact with your medicine. What should I watch for while using this medicine? Tell your doctor or health care professional if your pain does not get better. Talk to your doctor before taking another medicine for pain. Do not treat yourself. This medicine does not prevent heart attack or stroke. In fact, this medicine may increase the chance of a heart attack or stroke. The chance may increase with  longer use of this medicine and in people who have heart disease. If you take aspirin to prevent heart attack or stroke, talk with your doctor or health care professional. Do not take other medicines that contain aspirin, ibuprofen, or naproxen with this medicine. Side effects such as stomach upset, nausea, or ulcers may be more likely to occur. Many medicines available without a prescription should not be taken with this medicine. This medicine can cause ulcers and bleeding in the stomach and intestines at any time during treatment. Do not smoke cigarettes or drink alcohol. These increase irritation to your stomach and can make it more susceptible to  damage from this medicine. Ulcers and bleeding can happen without warning symptoms and can cause death. You may get drowsy or dizzy. Do not drive, use machinery, or do anything that needs mental alertness until you know how this medicine affects you. Do not stand or sit up quickly, especially if you are an older patient. This reduces the risk of dizzy or fainting spells. This medicine can cause you to bleed more easily. Try to avoid damage to your teeth and gums when you brush or floss your teeth. What side effects may I notice from receiving this medicine? Side effects that you should report to your doctor or health care professional as soon as possible: -black or bloody stools, blood in the urine or vomit -blurred vision -chest pain -difficulty breathing or wheezing -nausea or vomiting -severe stomach pain -skin rash, skin redness, blistering or peeling skin, hives, or itching -slurred speech or weakness on one side of the body -swelling of eyelids, throat, lips -unexplained weight gain or swelling -unusually weak or tired -yellowing of eyes or skin Side effects that usually do not require medical attention (report to your doctor or health care professional if they continue or are bothersome): -constipation -headache -heartburn This list may not describe all possible side effects. Call your doctor for medical advice about side effects. You may report side effects to FDA at 1-800-FDA-1088. Where should I keep my medicine? Keep out of the reach of children. Store at room temperature between 15 and 30 degrees C (59 and 86 degrees F). Keep container tightly closed. Throw away any unused medicine after the expiration date. NOTE: This sheet is a summary. It may not cover all possible information. If you have questions about this medicine, talk to your doctor, pharmacist, or health care provider.    2016, Elsevier[employer]. (2009-08-08 20:10:16)

## 2016-04-01 ENCOUNTER — Emergency Department (HOSPITAL_COMMUNITY): Payer: PPO

## 2016-04-01 ENCOUNTER — Encounter (HOSPITAL_COMMUNITY): Payer: Self-pay | Admitting: *Deleted

## 2016-04-01 ENCOUNTER — Inpatient Hospital Stay (HOSPITAL_COMMUNITY)
Admission: EM | Admit: 2016-04-01 | Discharge: 2016-04-07 | DRG: 054 | Disposition: A | Discharge status: Skilled Nursing Facility | Payer: PPO | Attending: Internal Medicine | Admitting: Internal Medicine

## 2016-04-01 DIAGNOSIS — C3412 Malignant neoplasm of upper lobe, left bronchus or lung: Secondary | ICD-10-CM | POA: Diagnosis present

## 2016-04-01 DIAGNOSIS — L900 Lichen sclerosus et atrophicus: Secondary | ICD-10-CM | POA: Diagnosis not present

## 2016-04-01 DIAGNOSIS — Z8249 Family history of ischemic heart disease and other diseases of the circulatory system: Secondary | ICD-10-CM

## 2016-04-01 DIAGNOSIS — C7902 Secondary malignant neoplasm of left kidney and renal pelvis: Secondary | ICD-10-CM | POA: Diagnosis present

## 2016-04-01 DIAGNOSIS — C7931 Secondary malignant neoplasm of brain: Principal | ICD-10-CM | POA: Diagnosis present

## 2016-04-01 DIAGNOSIS — C7901 Secondary malignant neoplasm of right kidney and renal pelvis: Secondary | ICD-10-CM | POA: Diagnosis present

## 2016-04-01 DIAGNOSIS — J4530 Mild persistent asthma, uncomplicated: Secondary | ICD-10-CM | POA: Diagnosis present

## 2016-04-01 DIAGNOSIS — G936 Cerebral edema: Secondary | ICD-10-CM | POA: Diagnosis present

## 2016-04-01 DIAGNOSIS — F419 Anxiety disorder, unspecified: Secondary | ICD-10-CM | POA: Diagnosis not present

## 2016-04-01 DIAGNOSIS — R296 Repeated falls: Secondary | ICD-10-CM | POA: Diagnosis not present

## 2016-04-01 DIAGNOSIS — Z801 Family history of malignant neoplasm of trachea, bronchus and lung: Secondary | ICD-10-CM | POA: Diagnosis not present

## 2016-04-01 DIAGNOSIS — Z7951 Long term (current) use of inhaled steroids: Secondary | ICD-10-CM | POA: Diagnosis not present

## 2016-04-01 DIAGNOSIS — Z9049 Acquired absence of other specified parts of digestive tract: Secondary | ICD-10-CM | POA: Diagnosis not present

## 2016-04-01 DIAGNOSIS — M1990 Unspecified osteoarthritis, unspecified site: Secondary | ICD-10-CM | POA: Diagnosis present

## 2016-04-01 DIAGNOSIS — I10 Essential (primary) hypertension: Secondary | ICD-10-CM | POA: Diagnosis present

## 2016-04-01 DIAGNOSIS — E119 Type 2 diabetes mellitus without complications: Secondary | ICD-10-CM | POA: Diagnosis not present

## 2016-04-01 DIAGNOSIS — N2889 Other specified disorders of kidney and ureter: Secondary | ICD-10-CM | POA: Diagnosis present

## 2016-04-01 DIAGNOSIS — C3492 Malignant neoplasm of unspecified part of left bronchus or lung: Secondary | ICD-10-CM | POA: Diagnosis not present

## 2016-04-01 DIAGNOSIS — E878 Other disorders of electrolyte and fluid balance, not elsewhere classified: Secondary | ICD-10-CM | POA: Diagnosis present

## 2016-04-01 DIAGNOSIS — K219 Gastro-esophageal reflux disease without esophagitis: Secondary | ICD-10-CM | POA: Diagnosis not present

## 2016-04-01 DIAGNOSIS — C3490 Malignant neoplasm of unspecified part of unspecified bronchus or lung: Secondary | ICD-10-CM | POA: Diagnosis not present

## 2016-04-01 DIAGNOSIS — D630 Anemia in neoplastic disease: Secondary | ICD-10-CM | POA: Diagnosis present

## 2016-04-01 DIAGNOSIS — I619 Nontraumatic intracerebral hemorrhage, unspecified: Secondary | ICD-10-CM | POA: Diagnosis not present

## 2016-04-01 DIAGNOSIS — E785 Hyperlipidemia, unspecified: Secondary | ICD-10-CM | POA: Diagnosis not present

## 2016-04-01 DIAGNOSIS — Z7982 Long term (current) use of aspirin: Secondary | ICD-10-CM

## 2016-04-01 DIAGNOSIS — Z7401 Bed confinement status: Secondary | ICD-10-CM

## 2016-04-01 DIAGNOSIS — R4182 Altered mental status, unspecified: Secondary | ICD-10-CM | POA: Diagnosis not present

## 2016-04-01 DIAGNOSIS — E871 Hypo-osmolality and hyponatremia: Secondary | ICD-10-CM | POA: Diagnosis present

## 2016-04-01 DIAGNOSIS — Z87891 Personal history of nicotine dependence: Secondary | ICD-10-CM | POA: Diagnosis not present

## 2016-04-01 DIAGNOSIS — G939 Disorder of brain, unspecified: Secondary | ICD-10-CM

## 2016-04-01 DIAGNOSIS — C799 Secondary malignant neoplasm of unspecified site: Secondary | ICD-10-CM

## 2016-04-01 DIAGNOSIS — D72829 Elevated white blood cell count, unspecified: Secondary | ICD-10-CM | POA: Diagnosis present

## 2016-04-01 DIAGNOSIS — W19XXXA Unspecified fall, initial encounter: Secondary | ICD-10-CM | POA: Diagnosis present

## 2016-04-01 DIAGNOSIS — R410 Disorientation, unspecified: Secondary | ICD-10-CM | POA: Diagnosis present

## 2016-04-01 DIAGNOSIS — Z7984 Long term (current) use of oral hypoglycemic drugs: Secondary | ICD-10-CM

## 2016-04-01 DIAGNOSIS — Z7902 Long term (current) use of antithrombotics/antiplatelets: Secondary | ICD-10-CM

## 2016-04-01 DIAGNOSIS — R509 Fever, unspecified: Secondary | ICD-10-CM

## 2016-04-01 LAB — COMPREHENSIVE METABOLIC PANEL
ALK PHOS: 60 U/L (ref 38–126)
ALT: 16 U/L (ref 14–54)
ANION GAP: 10 (ref 5–15)
AST: 22 U/L (ref 15–41)
Albumin: 3.8 g/dL (ref 3.5–5.0)
BUN: 9 mg/dL (ref 6–20)
CO2: 24 mmol/L (ref 22–32)
Calcium: 8.7 mg/dL — ABNORMAL LOW (ref 8.9–10.3)
Chloride: 100 mmol/L — ABNORMAL LOW (ref 101–111)
Creatinine, Ser: 0.64 mg/dL (ref 0.44–1.00)
Glucose, Bld: 327 mg/dL — ABNORMAL HIGH (ref 65–99)
Potassium: 4.1 mmol/L (ref 3.5–5.1)
Sodium: 134 mmol/L — ABNORMAL LOW (ref 135–145)
TOTAL PROTEIN: 7.1 g/dL (ref 6.5–8.1)
Total Bilirubin: 1.2 mg/dL (ref 0.3–1.2)

## 2016-04-01 LAB — CBC
HEMATOCRIT: 36 % (ref 36.0–46.0)
Hemoglobin: 11.9 g/dL — ABNORMAL LOW (ref 12.0–15.0)
MCH: 28.7 pg (ref 26.0–34.0)
MCHC: 33.1 g/dL (ref 30.0–36.0)
MCV: 87 fL (ref 78.0–100.0)
PLATELETS: 323 10*3/uL (ref 150–400)
RBC: 4.14 MIL/uL (ref 3.87–5.11)
RDW: 13.1 % (ref 11.5–15.5)
WBC: 13 10*3/uL — AB (ref 4.0–10.5)

## 2016-04-01 LAB — RAPID URINE DRUG SCREEN, HOSP PERFORMED
AMPHETAMINES: NOT DETECTED
BENZODIAZEPINES: POSITIVE — AB
Barbiturates: NOT DETECTED
Cocaine: NOT DETECTED
Opiates: NOT DETECTED
Tetrahydrocannabinol: NOT DETECTED

## 2016-04-01 LAB — URINALYSIS, ROUTINE W REFLEX MICROSCOPIC
Bilirubin Urine: NEGATIVE
Glucose, UA: 500 mg/dL — AB
Nitrite: NEGATIVE
SPECIFIC GRAVITY, URINE: 1.025 (ref 1.005–1.030)
pH: 6 (ref 5.0–8.0)

## 2016-04-01 LAB — URINE MICROSCOPIC-ADD ON

## 2016-04-01 LAB — PROTIME-INR
INR: 1.05
Prothrombin Time: 13.7 seconds (ref 11.4–15.2)

## 2016-04-01 LAB — GLUCOSE, CAPILLARY: Glucose-Capillary: 275 mg/dL — ABNORMAL HIGH (ref 65–99)

## 2016-04-01 LAB — TROPONIN I

## 2016-04-01 LAB — CBG MONITORING, ED
GLUCOSE-CAPILLARY: 251 mg/dL — AB (ref 65–99)
GLUCOSE-CAPILLARY: 252 mg/dL — AB (ref 65–99)
Glucose-Capillary: 388 mg/dL — ABNORMAL HIGH (ref 65–99)

## 2016-04-01 LAB — APTT: APTT: 29 s (ref 24–36)

## 2016-04-01 LAB — ETHANOL

## 2016-04-01 MED ORDER — METOPROLOL SUCCINATE ER 25 MG PO TB24
25.0000 mg | ORAL_TABLET | Freq: Every day | ORAL | Status: DC
  Administered 2016-04-02 – 2016-04-07 (×6): 25 mg via ORAL
  Filled 2016-04-01 (×6): qty 1

## 2016-04-01 MED ORDER — HYDROCODONE-ACETAMINOPHEN 5-325 MG PO TABS
1.0000 | ORAL_TABLET | ORAL | Status: DC | PRN
  Administered 2016-04-02 – 2016-04-07 (×5): 1 via ORAL
  Filled 2016-04-01 (×5): qty 1

## 2016-04-01 MED ORDER — ALBUTEROL SULFATE (2.5 MG/3ML) 0.083% IN NEBU
2.5000 mg | INHALATION_SOLUTION | RESPIRATORY_TRACT | Status: DC | PRN
  Administered 2016-04-06: 2.5 mg via RESPIRATORY_TRACT
  Filled 2016-04-01: qty 3

## 2016-04-01 MED ORDER — MOMETASONE FURO-FORMOTEROL FUM 200-5 MCG/ACT IN AERO
2.0000 | INHALATION_SPRAY | Freq: Two times a day (BID) | RESPIRATORY_TRACT | Status: DC
  Administered 2016-04-02 – 2016-04-07 (×7): 2 via RESPIRATORY_TRACT
  Filled 2016-04-01 (×2): qty 8.8

## 2016-04-01 MED ORDER — HYDROMORPHONE HCL 1 MG/ML IJ SOLN
1.0000 mg | Freq: Once | INTRAMUSCULAR | Status: AC
  Administered 2016-04-01: 1 mg via INTRAVENOUS
  Filled 2016-04-01: qty 1

## 2016-04-01 MED ORDER — ONDANSETRON HCL 4 MG/2ML IJ SOLN
4.0000 mg | Freq: Once | INTRAMUSCULAR | Status: AC
  Administered 2016-04-01: 4 mg via INTRAVENOUS

## 2016-04-01 MED ORDER — IOPAMIDOL (ISOVUE-300) INJECTION 61%
100.0000 mL | Freq: Once | INTRAVENOUS | Status: AC | PRN
  Administered 2016-04-01: 100 mL via INTRAVENOUS

## 2016-04-01 MED ORDER — LISINOPRIL 10 MG PO TABS
5.0000 mg | ORAL_TABLET | Freq: Every day | ORAL | Status: DC
  Administered 2016-04-02 – 2016-04-06 (×5): 5 mg via ORAL
  Filled 2016-04-01: qty 2
  Filled 2016-04-01 (×2): qty 1
  Filled 2016-04-01 (×2): qty 2

## 2016-04-01 MED ORDER — SODIUM CHLORIDE 0.9 % IV SOLN
INTRAVENOUS | Status: AC
  Administered 2016-04-01: 23:00:00 via INTRAVENOUS

## 2016-04-01 MED ORDER — ADULT MULTIVITAMIN W/MINERALS CH
1.0000 | ORAL_TABLET | Freq: Every day | ORAL | Status: DC
  Administered 2016-04-02 – 2016-04-07 (×5): 1 via ORAL
  Filled 2016-04-01 (×6): qty 1

## 2016-04-01 MED ORDER — ONDANSETRON HCL 4 MG/2ML IJ SOLN
4.0000 mg | Freq: Once | INTRAMUSCULAR | Status: AC
  Administered 2016-04-01: 4 mg via INTRAVENOUS
  Filled 2016-04-01: qty 2

## 2016-04-01 MED ORDER — ONDANSETRON HCL 4 MG/2ML IJ SOLN
INTRAMUSCULAR | Status: AC
  Filled 2016-04-01: qty 2

## 2016-04-01 MED ORDER — PANTOPRAZOLE SODIUM 40 MG PO TBEC
40.0000 mg | DELAYED_RELEASE_TABLET | Freq: Every day | ORAL | Status: DC
  Administered 2016-04-02 – 2016-04-07 (×6): 40 mg via ORAL
  Filled 2016-04-01 (×6): qty 1

## 2016-04-01 MED ORDER — LORAZEPAM 0.5 MG PO TABS: 0.5000 mg | ORAL_TABLET | Freq: Three times a day (TID) | ORAL | Status: DC | PRN

## 2016-04-01 MED ORDER — INSULIN ASPART 100 UNIT/ML ~~LOC~~ SOLN
10.0000 [IU] | Freq: Once | SUBCUTANEOUS | Status: AC
  Administered 2016-04-01: 10 [IU] via INTRAVENOUS
  Filled 2016-04-01: qty 1

## 2016-04-01 MED ORDER — SODIUM CHLORIDE 0.9 % IV BOLUS (SEPSIS)
500.0000 mL | Freq: Once | INTRAVENOUS | Status: AC
  Administered 2016-04-01: 500 mL via INTRAVENOUS

## 2016-04-01 MED ORDER — CALCIUM-MAGNESIUM-VITAMIN D 200-100-33.3 MG-MG-UNIT PO CAPS: 1.0000 | ORAL_CAPSULE | Freq: Every day | ORAL | Status: DC

## 2016-04-01 MED ORDER — SENNOSIDES-DOCUSATE SODIUM 8.6-50 MG PO TABS: 1.0000 | ORAL_TABLET | Freq: Every evening | ORAL | Status: DC | PRN

## 2016-04-01 MED ORDER — INSULIN ASPART 100 UNIT/ML ~~LOC~~ SOLN
0.0000 [IU] | SUBCUTANEOUS | Status: DC
  Administered 2016-04-01: 8 [IU] via SUBCUTANEOUS
  Administered 2016-04-02: 5 [IU] via SUBCUTANEOUS
  Administered 2016-04-02 (×2): 3 [IU] via SUBCUTANEOUS
  Administered 2016-04-02: 8 [IU] via SUBCUTANEOUS
  Administered 2016-04-02 – 2016-04-03 (×2): 3 [IU] via SUBCUTANEOUS
  Administered 2016-04-03: 14 [IU] via SUBCUTANEOUS
  Administered 2016-04-03: 3 [IU] via SUBCUTANEOUS
  Administered 2016-04-03: 5 [IU] via SUBCUTANEOUS
  Administered 2016-04-03 – 2016-04-04 (×3): 3 [IU] via SUBCUTANEOUS
  Administered 2016-04-04: 8 [IU] via SUBCUTANEOUS
  Administered 2016-04-04: 2 [IU] via SUBCUTANEOUS
  Administered 2016-04-04: 3 [IU] via SUBCUTANEOUS

## 2016-04-01 MED ORDER — SIMVASTATIN 20 MG PO TABS
20.0000 mg | ORAL_TABLET | Freq: Every evening | ORAL | Status: DC
  Administered 2016-04-02 – 2016-04-06 (×5): 20 mg via ORAL
  Filled 2016-04-01: qty 1
  Filled 2016-04-01: qty 2
  Filled 2016-04-01: qty 1
  Filled 2016-04-01: qty 2
  Filled 2016-04-01: qty 1

## 2016-04-01 MED ORDER — LABETALOL HCL 5 MG/ML IV SOLN
5.0000 mg | INTRAVENOUS | Status: DC | PRN
  Filled 2016-04-01: qty 4

## 2016-04-01 MED ORDER — TACROLIMUS 0.1 % EX OINT: 1.0000 "application " | TOPICAL_OINTMENT | Freq: Two times a day (BID) | CUTANEOUS | Status: DC

## 2016-04-01 MED ORDER — OMEGA-3-ACID ETHYL ESTERS 1 G PO CAPS
1.0000 g | ORAL_CAPSULE | Freq: Every day | ORAL | Status: DC
  Administered 2016-04-02 – 2016-04-07 (×5): 1 g via ORAL
  Filled 2016-04-01 (×6): qty 1

## 2016-04-01 MED ORDER — CALCIUM CARBONATE-VITAMIN D 500-200 MG-UNIT PO TABS
0.5000 | ORAL_TABLET | Freq: Every day | ORAL | Status: DC
  Administered 2016-04-02 – 2016-04-07 (×5): 0.5 via ORAL
  Filled 2016-04-01 (×6): qty 1

## 2016-04-01 MED ORDER — DIATRIZOATE MEGLUMINE & SODIUM 66-10 % PO SOLN
ORAL | Status: AC
  Filled 2016-04-01: qty 30

## 2016-04-01 MED ORDER — SODIUM CHLORIDE 0.9 % IV SOLN
INTRAVENOUS | Status: DC
  Administered 2016-04-01: 14:00:00 via INTRAVENOUS

## 2016-04-01 NOTE — ED Notes (Signed)
Pt vomiting, vo received from Dr. Rogene Houston for Zofran 4 mg IV.

## 2016-04-01 NOTE — ED Triage Notes (Signed)
Reported pt with multiple falls in last 24 hours and started calling people in middle of night. "feels like screaming" and wants people to do want she wants.  Pt denies SI but jokes when asked HI

## 2016-04-01 NOTE — ED Notes (Signed)
Report given to [facility] with [place]. ETA of ambulance 20 min.

## 2016-04-01 NOTE — ED Notes (Signed)
Pt up with assistance to bedside commode.

## 2016-04-01 NOTE — ED Provider Notes (Addendum)
[facility] DEPT Provider Note   CSN: [id number] Arrival date & time: 04/01/16  1131  First Provider Contact:  First MD Initiated Contact with Patient 04/01/16 1211        History   Chief Complaint Chief Complaint  Patient presents with  . Altered Mental Status    HPI REES MATURA is a 65 y.o. female.  Patient lives at home with her disabled husband. Patient brought in by family members. Patient's had multiple falls in the last 24 hours. And also has had some periods of confusion which is beginning worse over the last month. Patient with complaint of some right-sided lateral chest pain. Patient denies any headaches seizure fevers abdominal pain trouble breathing. Patient states that when she bends over she loses her balance. She denies any specific weakness. Denies any numbness. Denies any neck or back pain.      Past Medical History:  Diagnosis Date  . Anxiety   . Asthma   . Bronchitis   . Depression   . Essential hypertension   . GERD (gastroesophageal reflux disease)   . Hyperlipidemia   . Lichen sclerosus   . Osteoarthritis   . SVT (supraventricular tachycardia) (HCC)    No inducible arrhythmias by EP study 11/11  . Type 2 diabetes mellitus [facility])     Patient Active Problem List   Diagnosis Date Noted  . Hypertension 06/23/2012  . Snoring 04/12/2012  . Chest pain 04/10/2012  . Obesity 04/10/2012  . Hyperlipidemia 03/02/2011  . DIAB W/UNS COMP TYPE II/UNS NOT STATED UNCNTRL 08/01/2010  . ASTHMA UNSPECIFIED WITH EXACERBATION 01/24/2010  . PALPITATIONS 01/24/2010  . ARRHYTHMIA, HX OF 01/24/2010  . HEMORRHOIDS 06/21/2009  . RECTAL BLEEDING 06/21/2009  . Paroxysmal supraventricular tachycardia ([place]) 07/27/2008  . ASTHMA 07/27/2008  . GERD 07/27/2008    Past Surgical History:  Procedure Laterality Date  . CESAREAN SECTION WITH BILATERAL TUBAL LIGATION  1978  . CHOLECYSTECTOMY  2007  . EP study  11/11    OB History    No data available        Home Medications    Prior to Admission medications   Medication Sig Start Date End Date Taking? Authorizing Provider  acetaminophen (TYLENOL) 500 MG tablet Take 500 mg by mouth every 6 (six) hours as needed for mild pain.    Yes Historical Provider, MD  albuterol (PROAIR HFA) 108 (90 BASE) MCG/ACT inhaler Inhale 1 puff into the lungs every 6 (six) hours as needed for wheezing or shortness of breath.   Yes Historical Provider, MD  aspirin EC 81 MG tablet Take 81 mg by mouth at bedtime.    Yes Historical Provider, MD  B-Complex TABS Take 1 tablet by mouth daily.   Yes Historical Provider, MD  Biotin 5000 MCG CAPS Take 1 capsule by mouth 2 (two) times daily.   Yes Historical Provider, MD  budesonide-formoterol (SYMBICORT) 160-4.5 MCG/ACT inhaler Inhale 2 puffs into the lungs 2 (two) times daily.   Yes Historical Provider, MD  Calcium-Magnesium-Vitamin D 200-100-33.3 MG-MG-UNIT CAPS Take 1 capsule by mouth daily.   Yes Historical Provider, MD  clobetasol (TEMOVATE) 0.05 % cream Apply 1 application topically 2 (two) times daily.    Yes Historical Provider, MD  DHA-EPA-Flaxseed Oil-Vitamin E (THERA TEARS NUTRITION PO) Take 1 drop by mouth daily.   Yes Historical Provider, MD  glipiZIDE (GLUCOTROL) 10 MG tablet Take 10 mg by mouth 2 (two) times daily.     Yes Historical Provider, MD  HYDROcodone-acetaminophen (NORCO/VICODIN) 5-325  MG tablet Take 1 tablet by mouth 2 (two) times daily as needed for moderate pain.  03/09/16  Yes Historical Provider, MD  lisinopril (PRINIVIL,ZESTRIL) 5 MG tablet Take 1 tablet (5 mg total) by mouth daily. 01/16/13  Yes Thompson Grayer, MD  LORazepam (ATIVAN) 0.5 MG tablet Take 0.5 mg by mouth every 8 (eight) hours as needed for anxiety or sleep.  01/22/14  Yes Historical Provider, MD  metFORMIN (GLUCOPHAGE) 850 MG tablet Take 850 mg by mouth 2 (two) times daily with a meal.    Yes Historical Provider, MD  metoprolol succinate (TOPROL XL) 25 MG 24 hr tablet Take 1 tablet (25  mg) in the am, and 1/2 tablet (12.5 mg) at bedtime Patient taking differently: Take 25 mg by mouth daily.  01/24/16  Yes Satira Sark, MD  Multiple Vitamin (MULITIVITAMIN WITH MINERALS) TABS Take 1 tablet by mouth daily.   Yes Historical Provider, MD  Omega-3 Fatty Acids (FISH OIL) 1000 MG CAPS Take 1 capsule by mouth 2 (two) times daily.    Yes Historical Provider, MD  omeprazole (PRILOSEC) 20 MG capsule Take 20 mg by mouth every other day.    Yes Historical Provider, MD  simvastatin (ZOCOR) 20 MG tablet Take 20 mg by mouth every evening.   Yes Historical Provider, MD  tacrolimus (PROTOPIC) 0.1 % ointment Apply 1 application topically 2 (two) times daily.   Yes Historical Provider, MD  naproxen (NAPROSYN) 375 MG tablet Take 1 tablet (375 mg total) by mouth 2 (two) times daily. Patient not taking: Reported on 04/01/2016 04/28/36   Delora Fuel, MD  predniSONE (DELTASONE) 10 MG tablet Take 6 tablets (60 mg total) by mouth daily. Patient not taking: Reported on 04/01/2016 08/26/15   Jola Schmidt, MD    Family History Family History  Problem Relation Age of Onset  . Hypertension Father   . COPD Father   . Parkinson's disease Father   . Hypertension Mother     Social History Social History  Substance Use Topics  . Smoking status: Former Smoker    Types: Cigarettes  . Smokeless tobacco: Never Used     Comment: quit in 1980's  . Alcohol use No     Allergies   Review of patient's allergies indicates no known allergies.   Review of Systems Review of Systems  Constitutional: Negative for fever.  HENT: Negative for congestion.   Eyes: Negative for visual disturbance.  Respiratory: Negative for cough and shortness of breath.   Cardiovascular: Positive for chest pain.  Gastrointestinal: Negative for abdominal pain, nausea and vomiting.  Genitourinary: Negative for dysuria.  Musculoskeletal: Negative for back pain and neck pain.  Skin: Negative for rash.  Neurological: Positive for  weakness. Negative for seizures and headaches.  Psychiatric/Behavioral: Positive for confusion.     Physical Exam Updated Vital Signs BP 142/73   Pulse 96   Temp 98.8 F (37.1 C) (Oral)   Resp 18   Ht '5\' 2"'$  (1.575 m)   Wt 80.3 kg   SpO2 96%   BMI 32.37 kg/m   Physical Exam  Constitutional: She appears well-developed and well-nourished. No distress.  HENT:  Head: Normocephalic and atraumatic.  Mouth/Throat: Oropharynx is clear and moist.  Eyes: Conjunctivae and EOM are normal. Pupils are equal, round, and reactive to light.  Neck: Normal range of motion. Neck supple.  Cardiovascular: Normal rate, regular rhythm and normal heart sounds.   Pulmonary/Chest: Effort normal and breath sounds normal. No respiratory distress.  Abdominal: Soft. Bowel  sounds are normal. There is no tenderness.  Musculoskeletal: Normal range of motion.  Neurological: She is alert. No cranial nerve deficit. She exhibits normal muscle tone. Coordination normal.  Skin: Skin is warm. No rash noted.  Nursing note and vitals reviewed.    ED Treatments / Results  Labs (all labs ordered are listed, but only abnormal results are displayed) Labs Reviewed  COMPREHENSIVE METABOLIC PANEL - Abnormal; Notable for the following:       Result Value   Sodium 134 (*)    Chloride 100 (*)    Glucose, Bld 327 (*)    Calcium 8.7 (*)    All other components within normal limits  CBC - Abnormal; Notable for the following:    WBC 13.0 (*)    Hemoglobin 11.9 (*)    All other components within normal limits  URINE RAPID DRUG SCREEN, HOSP PERFORMED - Abnormal; Notable for the following:    Benzodiazepines POSITIVE (*)    All other components within normal limits  URINALYSIS, ROUTINE W REFLEX MICROSCOPIC (NOT AT [facility]) - Abnormal; Notable for the following:    APPearance HAZY (*)    Glucose, UA 500 (*)    Hgb urine dipstick LARGE (*)    Ketones, ur TRACE (*)    Protein, ur TRACE (*)    Leukocytes, UA TRACE (*)     All other components within normal limits  URINE MICROSCOPIC-ADD ON - Abnormal; Notable for the following:    Squamous Epithelial / LPF 0-5 (*)    Bacteria, UA FEW (*)    All other components within normal limits  CBG MONITORING, ED - Abnormal; Notable for the following:    Glucose-Capillary 388 (*)    All other components within normal limits  CBG MONITORING, ED - Abnormal; Notable for the following:    Glucose-Capillary 251 (*)    All other components within normal limits  ETHANOL  TROPONIN I   Results for orders placed or performed during the hospital encounter of 04/01/16  Comprehensive metabolic panel  Result Value Ref Range   Sodium 134 (L) 135 - 145 mmol/L   Potassium 4.1 3.5 - 5.1 mmol/L   Chloride 100 (L) 101 - 111 mmol/L   CO2 24 22 - 32 mmol/L   Glucose, Bld 327 (H) 65 - 99 mg/dL   BUN 9 6 - 20 mg/dL   Creatinine, Ser 0.64 0.44 - 1.00 mg/dL   Calcium 8.7 (L) 8.9 - 10.3 mg/dL   Total Protein 7.1 6.5 - 8.1 g/dL   Albumin 3.8 3.5 - 5.0 g/dL   AST 22 15 - 41 U/L   ALT 16 14 - 54 U/L   Alkaline Phosphatase 60 38 - 126 U/L   Total Bilirubin 1.2 0.3 - 1.2 mg/dL   GFR calc non Af Amer >60 >60 mL/min   GFR calc Af Amer >60 >60 mL/min   Anion gap 10 5 - 15  CBC  Result Value Ref Range   WBC 13.0 (H) 4.0 - 10.5 K/uL   RBC 4.14 3.87 - 5.11 MIL/uL   Hemoglobin 11.9 (L) 12.0 - 15.0 g/dL   HCT 36.0 36.0 - 46.0 %   MCV 87.0 78.0 - 100.0 fL   MCH 28.7 26.0 - 34.0 pg   MCHC 33.1 30.0 - 36.0 g/dL   RDW 13.1 11.5 - 15.5 %   Platelets 323 150 - 400 K/uL  Urine rapid drug screen (hosp performed)  Result Value Ref Range   Opiates NONE DETECTED NONE  DETECTED   Cocaine NONE DETECTED NONE DETECTED   Benzodiazepines POSITIVE (A) NONE DETECTED   Amphetamines NONE DETECTED NONE DETECTED   Tetrahydrocannabinol NONE DETECTED NONE DETECTED   Barbiturates NONE DETECTED NONE DETECTED  Urinalysis, Routine w reflex microscopic (not at [facility])  Result Value Ref Range   Color, Urine YELLOW  YELLOW   APPearance HAZY (A) CLEAR   Specific Gravity, Urine 1.025 1.005 - 1.030   pH 6.0 5.0 - 8.0   Glucose, UA 500 (A) NEGATIVE mg/dL   Hgb urine dipstick LARGE (A) NEGATIVE   Bilirubin Urine NEGATIVE NEGATIVE   Ketones, ur TRACE (A) NEGATIVE mg/dL   Protein, ur TRACE (A) NEGATIVE mg/dL   Nitrite NEGATIVE NEGATIVE   Leukocytes, UA TRACE (A) NEGATIVE  Ethanol  Result Value Ref Range   Alcohol, Ethyl (B) <5 <5 mg/dL  Troponin I  Result Value Ref Range   Troponin I <0.03 <0.03 ng/mL  Urine microscopic-add on  Result Value Ref Range   Squamous Epithelial / LPF 0-5 (A) NONE SEEN   WBC, UA 6-30 0 - 5 WBC/hpf   RBC / HPF 6-30 0 - 5 RBC/hpf   Bacteria, UA FEW (A) NONE SEEN   Urine-Other YEAST PRESENT   CBG monitoring, ED  Result Value Ref Range   Glucose-Capillary 388 (H) 65 - 99 mg/dL  CBG monitoring, ED  Result Value Ref Range   Glucose-Capillary 251 (H) 65 - 99 mg/dL     EKG  EKG Interpretation  Date/Time:  Sunday April 01 2016 11:48:17 EDT Ventricular Rate:  89 PR Interval:    QRS Duration: 100 QT Interval:  372 QTC Calculation: 453 R Axis:   66 Text Interpretation:  Sinus rhythm RSR' in V1 or V2, right VCD or RVH Baseline wander in lead(s) V1 No significant change since last tracing Confirmed by Dalaysia Harms  MD, Linnie Mcglocklin [phone]) on 04/01/2016 12:16:23 PM       Radiology Dg Chest 2 View  Result Date: 04/01/2016 CLINICAL DATA:  Chest pain and dizziness. Several falls in last 24 hours. EXAM: CHEST  2 VIEW COMPARISON:  08/26/2015 FINDINGS: The heart size and mediastinal contours are within normal limits. Both lungs are clear. The visualized skeletal structures are unremarkable. IMPRESSION: No active cardiopulmonary disease. Electronically Signed   By: Earle Gell M.D.   On: 04/01/2016 13:38   Ct Head Wo Contrast  Result Date: 04/01/2016 CLINICAL DATA:  Reported pt with multiple falls in last 24 hours and started calling people in middle of night. "feels like screaming"  and wants people to do want she wants. Hx dm. EXAM: CT HEAD WITHOUT CONTRAST TECHNIQUE: Contiguous axial images were obtained from the base of the skull through the vertex without intravenous contrast. COMPARISON:  None. FINDINGS: There are multiple brain hemorrhages. In the right base a ganglia centered in the region of the genu of the right internal capsule, there is a large round hemorrhage measuring 2.8 x 2.5 x 2.6 cm. This has mass effect mostly facing the third ventricle and shifting the midline structures to the left by 7 mm. There is surrounding vasogenic edema. There are multiple small hemorrhages along the corticomedullary junctions. There is a hemorrhage in the anterior left temporal lobe another smaller hemorrhage in the posterior left occipital lobe another in the right frontal lobe, 1 in the right cerebellum and a smaller along the peripheral left cerebellum. Another area of hemorrhage lies along the inferior margin of the temporal horn of the right lateral ventricle in the right  temporal lobe. There is mild prominence of the lateral ventricles, most notably the atria and temporal horns. This suggests a mild degree of obstructive hydrocephalus. There is mild patchy white matter hypoattenuation consistent with chronic microvascular ischemic change. There is no evidence of an ischemic infarct. There are no extra-axial masses or abnormal fluid collections. No skull lesion. The visualized sinuses and mastoid air cells are clear. IMPRESSION: 1. Multiple brain hemorrhages. Given the pattern, hemorrhagic metastatic disease is suspected. Alternatively, the large right base a ganglia hemorrhage may be a hypertensive hemorrhage. Other corticomedullary junction hemorrhages could potentially be traumatic in origin. 2. Recommend nonemergent follow-up chest, abdomen and pelvis CT to assess for neoplastic disease. Electronically Signed   By: Lajean Manes M.D.   On: 04/01/2016 14:08   Ct Chest W Contrast  Result  Date: 04/01/2016 CLINICAL DATA:  Multiple falls. Hemorrhagic brain metastases seen on recent CT. Diabetes. EXAM: CT CHEST, ABDOMEN, AND PELVIS WITH CONTRAST TECHNIQUE: Multidetector CT imaging of the chest, abdomen and pelvis was performed following the standard protocol during bolus administration of intravenous contrast. CONTRAST:  195m ISOVUE-300 IOPAMIDOL (ISOVUE-300) INJECTION 61% COMPARISON:  AP CT on 07/25/2005 FINDINGS: CT CHEST FINDINGS Cardiovascular: Normal heart size. Mild LAD coronary artery calcification seen. Aortic atherosclerosis noted. Mediastinum/Lymph Nodes: No pathologically enlarged lymph nodes identified. Lungs/Pleura: An irregular pulmonary masses seen in the medial left upper lobe which shows invasion of adjacent mediastinal fat. This measures 3.7 x 2.9 cm on image 13/2. This is suspicious for primary bronchogenic carcinoma. 5 mm perifissural nodule in the right lung along the minor fissure on image 72/series 7 is most consistent with a benign intrapulmonary lymph node. No evidence of pleural effusion. Musculoskeletal: No chest wall mass or suspicious bone lesions identified. CT ABDOMEN PELVIS FINDINGS Hepatobiliary: Moderate hepatic steatosis is seen but decreased since previous study. No liver masses identified. Prior cholecystectomy noted. No evidence of biliary dilatation. Pancreas: No mass, inflammatory changes, or other significant abnormality. Spleen: Within normal limits in size and appearance. Adrenals/Urinary Tract: Normal adrenal glands. 2.5 cm low-attenuation lesion is seen in the upper pole left kidney on image 20/8, and low-attenuation lesion measuring 2.2 cm is seen in the upper pole of the right kidney on image 23/8. These are new since previous study, are ill-defined, and do not meet criteria for benign cysts. Differential diagnosis includes renal metastases, renal cell carcinomas, and renal abscesses. No evidence of hydronephrosis. Unopacified urinary bladder is  unremarkable in appearance. Stomach/Bowel: No evidence of obstruction, inflammatory process, or abnormal fluid collections. Vascular/Lymphatic: No pathologically enlarged lymph nodes. No evidence of abdominal aortic aneurysm. Aortic atherosclerosis noted. Reproductive: No mass or other significant abnormality. Other: None. Musculoskeletal:  No suspicious bone lesions identified. IMPRESSION: 3.7 cm mass in medial left upper lobe with adjacent mediastinal invasion. This is highly suspicious for primary bronchogenic carcinoma. No evidence of thoracic lymphadenopathy or pleural effusion. New ill-defined low-attenuation masses in both kidneys measuring 2-3 cm. Differential diagnosis includes renal metastases, primary renal cell carcinomas, and renal abscesses. Consider abdomen MRI without and with contrast for further characterization. No other sites of metastatic disease identified within the abdomen or pelvis. Decreased hepatic steatosis since prior study. Aortic atherosclerosis and coronary artery calcification. Electronically Signed   By: JEarle GellM.D.   On: 04/01/2016 17:19   Ct Abdomen Pelvis W Contrast  Result Date: 04/01/2016 CLINICAL DATA:  Multiple falls. Hemorrhagic brain metastases seen on recent CT. Diabetes. EXAM: CT CHEST, ABDOMEN, AND PELVIS WITH CONTRAST TECHNIQUE: Multidetector CT imaging of the  chest, abdomen and pelvis was performed following the standard protocol during bolus administration of intravenous contrast. CONTRAST:  120m ISOVUE-300 IOPAMIDOL (ISOVUE-300) INJECTION 61% COMPARISON:  AP CT on 07/25/2005 FINDINGS: CT CHEST FINDINGS Cardiovascular: Normal heart size. Mild LAD coronary artery calcification seen. Aortic atherosclerosis noted. Mediastinum/Lymph Nodes: No pathologically enlarged lymph nodes identified. Lungs/Pleura: An irregular pulmonary masses seen in the medial left upper lobe which shows invasion of adjacent mediastinal fat. This measures 3.7 x 2.9 cm on image 13/2. This  is suspicious for primary bronchogenic carcinoma. 5 mm perifissural nodule in the right lung along the minor fissure on image 72/series 7 is most consistent with a benign intrapulmonary lymph node. No evidence of pleural effusion. Musculoskeletal: No chest wall mass or suspicious bone lesions identified. CT ABDOMEN PELVIS FINDINGS Hepatobiliary: Moderate hepatic steatosis is seen but decreased since previous study. No liver masses identified. Prior cholecystectomy noted. No evidence of biliary dilatation. Pancreas: No mass, inflammatory changes, or other significant abnormality. Spleen: Within normal limits in size and appearance. Adrenals/Urinary Tract: Normal adrenal glands. 2.5 cm low-attenuation lesion is seen in the upper pole left kidney on image 20/8, and low-attenuation lesion measuring 2.2 cm is seen in the upper pole of the right kidney on image 23/8. These are new since previous study, are ill-defined, and do not meet criteria for benign cysts. Differential diagnosis includes renal metastases, renal cell carcinomas, and renal abscesses. No evidence of hydronephrosis. Unopacified urinary bladder is unremarkable in appearance. Stomach/Bowel: No evidence of obstruction, inflammatory process, or abnormal fluid collections. Vascular/Lymphatic: No pathologically enlarged lymph nodes. No evidence of abdominal aortic aneurysm. Aortic atherosclerosis noted. Reproductive: No mass or other significant abnormality. Other: None. Musculoskeletal:  No suspicious bone lesions identified. IMPRESSION: 3.7 cm mass in medial left upper lobe with adjacent mediastinal invasion. This is highly suspicious for primary bronchogenic carcinoma. No evidence of thoracic lymphadenopathy or pleural effusion. New ill-defined low-attenuation masses in both kidneys measuring 2-3 cm. Differential diagnosis includes renal metastases, primary renal cell carcinomas, and renal abscesses. Consider abdomen MRI without and with contrast for  further characterization. No other sites of metastatic disease identified within the abdomen or pelvis. Decreased hepatic steatosis since prior study. Aortic atherosclerosis and coronary artery calcification. Electronically Signed   By: JEarle GellM.D.   On: 04/01/2016 17:19    Procedures Procedures (including critical care time)  Medications Ordered in ED Medications  0.9 %  sodium chloride infusion ( Intravenous New Bag/Given 04/01/16 1407)  diatrizoate meglumine-sodium (GASTROGRAFIN) 66-10 % solution (not administered)  sodium chloride 0.9 % bolus 500 mL (0 mLs Intravenous Stopped 04/01/16 1406)  insulin aspart (novoLOG) injection 10 Units (10 Units Intravenous Given 04/01/16 1250)  iopamidol (ISOVUE-300) 61 % injection 100 mL (100 mLs Intravenous Contrast Given 04/01/16 1644)     Initial Impression / Assessment and Plan / ED Course  I have reviewed the triage vital signs and the nursing notes.  Pertinent labs & imaging results that were available during my care of the patient were reviewed by me and considered in my medical decision making (see chart for details).  Clinical Course   Workup shows evidence of probable primary left-sided lung cancer. With metastatic disease to the brain which probably explains the altered mental status and some of the balance issues. As well as metastatic disease to the kidneys. Discussed with the hospitalist here. [facility]. Patient will probably need the bronchoscopy for tissue diagnosis. And then game  plan arrange for workup.   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

## 2016-04-01 NOTE — ED Notes (Signed)
Pt to CT scan.

## 2016-04-01 NOTE — ED Notes (Signed)
Incorrect reading on Pulse ox and HR by pulse ox.

## 2016-04-01 NOTE — ED Notes (Signed)
Pt and family requesting Social Work consult for [employer] POA.

## 2016-04-01 NOTE — ED Notes (Signed)
Pt to Radiology

## 2016-04-01 NOTE — H&P (Signed)
History and Physical    FAHIMA [name]:[id number] DOB: 1951/08/15 DOA: 04/01/2016  PCP: Robert Bellow, MD   Patient coming from: Home   Chief Complaint: Confusion, falls   HPI: [name] is a 65 y.o. female with medical history significant for type 2 diabetes mellitus, hypertension, GERD, and osteoarthritis who presents the emergency department with one month of intermittent confusion and multiple falls over the last 24 hours. Patient had reportedly been in her usual state of health until approximately one month ago when her husband began to notice some intermittent confusion. Patient acknowledges this as well. Over the last 24 hours, the patient has had multiple falls, apparently losing her coordination when she bends over or turns quickly. There was no significant injuries reported with these falls. Patient denies any headaches or change in vision or hearing, but acknowledges some mild weakness and numbness in the left upper extremity which is developed insidiously over the past several days. She endorses chronic dyspnea and cough attributed to her asthma, but denies any significant recent worsening. She denies recent fevers, chills, chest pain, palpitations, abdominal pain, or diarrhea. She has been nauseous and vomited once today. She denies a personal history of smoking, but has significant secondhand smoke exposure from her husband. She endorses a family history of lung cancer.  ED Course: Upon arrival to the ED, patient is found to be afebrile, saturating well on room air, and with vitals otherwise stable. EKG demonstrates sinus rhythm with RSR'in V1 and V2. Chest x-ray is negative for acute cardiopulmonary disease. Chemistry panel features a mild hyponatremia and hypochloremia as well as serum glucose of 327. CBC features a leukocytosis to 13,000 and a mild normocytic anemia with hemoglobin of 11.9. Troponin is undetectable, ethanol level is undetectable, and UDS is positive for  her prescribed benzodiazepines only. CT of the head without contrast was obtained and notable for multiple brain hemorrhages and a pattern suggestive of metastatic disease. CT of the abdomen and pelvis was obtained, looking for a source of metastases and a 3.7 cm mass was identified in the medial left upper lobe with adjacent mediastinal invasion suspicious for primary bronchogenic carcinoma. New ill-defined low attenuation masses were also noted in the bilateral kidneys, possibly metastases, and MRI with and without contrast was advised for further characterization. Patient was treated with 10 units of IV NovoLog and a 500 mL normal saline bolus in the ED. She has remained hemodynamically stable and will be admitted for ongoing evaluation and management of suspected new lung cancer with brain metastases.  Review of Systems:  All other systems reviewed and apart from HPI, are negative.  Past Medical History:  Diagnosis Date  . Anxiety   . Asthma   . Bronchitis   . Depression   . Essential hypertension   . GERD (gastroesophageal reflux disease)   . Hyperlipidemia   . Lichen sclerosus   . Osteoarthritis   . SVT (supraventricular tachycardia) (HCC)    No inducible arrhythmias by EP study 11/11  . Type 2 diabetes mellitus ([place])     Past Surgical History:  Procedure Laterality Date  . CESAREAN SECTION WITH BILATERAL TUBAL LIGATION  1978  . CHOLECYSTECTOMY  2007  . EP study  11/11     reports that she has quit smoking. Her smoking use included Cigarettes. She has never used smokeless tobacco. She reports that she does not drink alcohol or use drugs.  No Known Allergies  Family History  Problem Relation Age of Onset  .  Hypertension Father   . COPD Father   . Parkinson's disease Father   . Hypertension Mother      Prior to Admission medications   Medication Sig Start Date End Date Taking? Authorizing Provider  acetaminophen (TYLENOL) 500 MG tablet Take 500 mg by mouth every 6 (six)  hours as needed for mild pain.    Yes Historical Provider, MD  albuterol (PROAIR HFA) 108 (90 BASE) MCG/ACT inhaler Inhale 1 puff into the lungs every 6 (six) hours as needed for wheezing or shortness of breath.   Yes Historical Provider, MD  aspirin EC 81 MG tablet Take 81 mg by mouth at bedtime.    Yes Historical Provider, MD  B-Complex TABS Take 1 tablet by mouth daily.   Yes Historical Provider, MD  Biotin 5000 MCG CAPS Take 1 capsule by mouth 2 (two) times daily.   Yes Historical Provider, MD  budesonide-formoterol (SYMBICORT) 160-4.5 MCG/ACT inhaler Inhale 2 puffs into the lungs 2 (two) times daily.   Yes Historical Provider, MD  Calcium-Magnesium-Vitamin D 200-100-33.3 MG-MG-UNIT CAPS Take 1 capsule by mouth daily.   Yes Historical Provider, MD  clobetasol (TEMOVATE) 0.05 % cream Apply 1 application topically 2 (two) times daily.    Yes Historical Provider, MD  DHA-EPA-Flaxseed Oil-Vitamin E (THERA TEARS NUTRITION PO) Take 1 drop by mouth daily.   Yes Historical Provider, MD  glipiZIDE (GLUCOTROL) 10 MG tablet Take 10 mg by mouth 2 (two) times daily.     Yes Historical Provider, MD  HYDROcodone-acetaminophen (NORCO/VICODIN) 5-325 MG tablet Take 1 tablet by mouth 2 (two) times daily as needed for moderate pain.  03/09/16  Yes Historical Provider, MD  lisinopril (PRINIVIL,ZESTRIL) 5 MG tablet Take 1 tablet (5 mg total) by mouth daily. 01/16/13  Yes Thompson Grayer, MD  LORazepam (ATIVAN) 0.5 MG tablet Take 0.5 mg by mouth every 8 (eight) hours as needed for anxiety or sleep.  01/22/14  Yes Historical Provider, MD  metFORMIN (GLUCOPHAGE) 850 MG tablet Take 850 mg by mouth 2 (two) times daily with a meal.    Yes Historical Provider, MD  metoprolol succinate (TOPROL XL) 25 MG 24 hr tablet Take 1 tablet (25 mg) in the am, and 1/2 tablet (12.5 mg) at bedtime Patient taking differently: Take 25 mg by mouth daily.  01/24/16  Yes Satira Sark, MD  Multiple Vitamin (MULITIVITAMIN WITH MINERALS) TABS Take 1  tablet by mouth daily.   Yes Historical Provider, MD  Omega-3 Fatty Acids (FISH OIL) 1000 MG CAPS Take 1 capsule by mouth 2 (two) times daily.    Yes Historical Provider, MD  omeprazole (PRILOSEC) 20 MG capsule Take 20 mg by mouth every other day.    Yes Historical Provider, MD  simvastatin (ZOCOR) 20 MG tablet Take 20 mg by mouth every evening.   Yes Historical Provider, MD  tacrolimus (PROTOPIC) 0.1 % ointment Apply 1 application topically 2 (two) times daily.   Yes Historical Provider, MD  naproxen (NAPROSYN) 375 MG tablet Take 1 tablet (375 mg total) by mouth 2 (two) times daily. Patient not taking: Reported on 04/01/2016 08/22/43   Delora Fuel, MD  predniSONE (DELTASONE) 10 MG tablet Take 6 tablets (60 mg total) by mouth daily. Patient not taking: Reported on 04/01/2016 08/26/15   Jola Schmidt, MD    Physical Exam: Vitals:   04/01/16 1534 04/01/16 1619 04/01/16 1747 04/01/16 1800  BP: 116/92 129/78 142/73 (!) 149/33  Pulse: 91 88 96   Resp: '23 20 18 20  '$ Temp:  TempSrc:      SpO2: 100% 97% 96%   Weight:      Height:          Constitutional: NAD, calm, comfortable Eyes: PERTLA, lids and conjunctivae normal ENMT: Mucous membranes are moist. Posterior pharynx clear of any exudate or lesions.   Neck: normal, supple, no masses, no thyromegaly Respiratory: clear to auscultation bilaterally, no wheezing, no crackles. Normal respiratory effort.  Cardiovascular: S1 & S2 heard, regular rate and rhythm. 2+ pedal pulses. No significant JVD. Abdomen: No distension, no tenderness, no masses palpated. Bowel sounds normal.  Musculoskeletal: no clubbing / cyanosis. No joint deformity upper and lower extremities. Normal muscle tone.  Skin: no significant rashes, lesions, ulcers. Warm, dry, well-perfused. Neurologic: CN 2-12 grossly intact. Sensation to light touch diminished in distal LUE, DTR normal. Strength 5/5 in all 4 limbs.  Psychiatric: Normal judgment and insight. Alert and oriented x 3.  Normal mood and affect.     Labs on Admission: I have personally reviewed following labs and imaging studies  CBC:  Recent Labs Lab 04/01/16 1210  WBC 13.0*  HGB 11.9*  HCT 36.0  MCV 87.0  PLT 756   Basic Metabolic Panel:  Recent Labs Lab 04/01/16 1210  NA 134*  K 4.1  CL 100*  CO2 24  GLUCOSE 327*  BUN 9  CREATININE 0.64  CALCIUM 8.7*   GFR: Estimated Creatinine Clearance: 70.7 mL/min (by C-G formula based on SCr of 0.8 mg/dL). Liver Function Tests:  Recent Labs Lab 04/01/16 1210  AST 22  ALT 16  ALKPHOS 60  BILITOT 1.2  PROT 7.1  ALBUMIN 3.8   No results for input(s): LIPASE, AMYLASE in the last 168 hours. No results for input(s): AMMONIA in the last 168 hours. Coagulation Profile: No results for input(s): INR, PROTIME in the last 168 hours. Cardiac Enzymes:  Recent Labs Lab 04/01/16 1256  TROPONINI <0.03   BNP (last 3 results) No results for input(s): PROBNP in the last 8760 hours. HbA1C: No results for input(s): HGBA1C in the last 72 hours. CBG:  Recent Labs Lab 04/01/16 1229 04/01/16 1421  GLUCAP 388* 251*   Lipid Profile: No results for input(s): CHOL, HDL, LDLCALC, TRIG, CHOLHDL, LDLDIRECT in the last 72 hours. Thyroid Function Tests: No results for input(s): TSH, T4TOTAL, FREET4, T3FREE, THYROIDAB in the last 72 hours. Anemia Panel: No results for input(s): VITAMINB12, FOLATE, FERRITIN, TIBC, IRON, RETICCTPCT in the last 72 hours. Urine analysis:    Component Value Date/Time   COLORURINE YELLOW 04/01/2016 1428   APPEARANCEUR HAZY (A) 04/01/2016 1428   LABSPEC 1.025 04/01/2016 1428   PHURINE 6.0 04/01/2016 1428   GLUCOSEU 500 (A) 04/01/2016 1428   HGBUR LARGE (A) 04/01/2016 1428   BILIRUBINUR NEGATIVE 04/01/2016 1428   KETONESUR TRACE (A) 04/01/2016 1428   PROTEINUR TRACE (A) 04/01/2016 1428   UROBILINOGEN 0.2 01/21/2012 1145   NITRITE NEGATIVE 04/01/2016 1428   LEUKOCYTESUR TRACE (A) 04/01/2016 1428   Sepsis  Labs: '@LABRCNTIP'$ (procalcitonin:4,lacticidven:4) )No results found for this or any previous visit (from the past 240 hour(s)).   Radiological Exams on Admission: Dg Chest 2 View  Result Date: 04/01/2016 CLINICAL DATA:  Chest pain and dizziness. Several falls in last 24 hours. EXAM: CHEST  2 VIEW COMPARISON:  08/26/2015 FINDINGS: The heart size and mediastinal contours are within normal limits. Both lungs are clear. The visualized skeletal structures are unremarkable. IMPRESSION: No active cardiopulmonary disease. Electronically Signed   By: Earle Gell M.D.   On: 04/01/2016  13:38   Ct Head Wo Contrast  Result Date: 04/01/2016 CLINICAL DATA:  Reported pt with multiple falls in last 24 hours and started calling people in middle of night. "feels like screaming" and wants people to do want she wants. Hx dm. EXAM: CT HEAD WITHOUT CONTRAST TECHNIQUE: Contiguous axial images were obtained from the base of the skull through the vertex without intravenous contrast. COMPARISON:  None. FINDINGS: There are multiple brain hemorrhages. In the right base a ganglia centered in the region of the genu of the right internal capsule, there is a large round hemorrhage measuring 2.8 x 2.5 x 2.6 cm. This has mass effect mostly facing the third ventricle and shifting the midline structures to the left by 7 mm. There is surrounding vasogenic edema. There are multiple small hemorrhages along the corticomedullary junctions. There is a hemorrhage in the anterior left temporal lobe another smaller hemorrhage in the posterior left occipital lobe another in the right frontal lobe, 1 in the right cerebellum and a smaller along the peripheral left cerebellum. Another area of hemorrhage lies along the inferior margin of the temporal horn of the right lateral ventricle in the right temporal lobe. There is mild prominence of the lateral ventricles, most notably the atria and temporal horns. This suggests a mild degree of obstructive  hydrocephalus. There is mild patchy white matter hypoattenuation consistent with chronic microvascular ischemic change. There is no evidence of an ischemic infarct. There are no extra-axial masses or abnormal fluid collections. No skull lesion. The visualized sinuses and mastoid air cells are clear. IMPRESSION: 1. Multiple brain hemorrhages. Given the pattern, hemorrhagic metastatic disease is suspected. Alternatively, the large right base a ganglia hemorrhage may be a hypertensive hemorrhage. Other corticomedullary junction hemorrhages could potentially be traumatic in origin. 2. Recommend nonemergent follow-up chest, abdomen and pelvis CT to assess for neoplastic disease. Electronically Signed   By: Lajean Manes M.D.   On: 04/01/2016 14:08   Ct Chest W Contrast  Result Date: 04/01/2016 CLINICAL DATA:  Multiple falls. Hemorrhagic brain metastases seen on recent CT. Diabetes. EXAM: CT CHEST, ABDOMEN, AND PELVIS WITH CONTRAST TECHNIQUE: Multidetector CT imaging of the chest, abdomen and pelvis was performed following the standard protocol during bolus administration of intravenous contrast. CONTRAST:  133m ISOVUE-300 IOPAMIDOL (ISOVUE-300) INJECTION 61% COMPARISON:  AP CT on 07/25/2005 FINDINGS: CT CHEST FINDINGS Cardiovascular: Normal heart size. Mild LAD coronary artery calcification seen. Aortic atherosclerosis noted. Mediastinum/Lymph Nodes: No pathologically enlarged lymph nodes identified. Lungs/Pleura: An irregular pulmonary masses seen in the medial left upper lobe which shows invasion of adjacent mediastinal fat. This measures 3.7 x 2.9 cm on image 13/2. This is suspicious for primary bronchogenic carcinoma. 5 mm perifissural nodule in the right lung along the minor fissure on image 72/series 7 is most consistent with a benign intrapulmonary lymph node. No evidence of pleural effusion. Musculoskeletal: No chest wall mass or suspicious bone lesions identified. CT ABDOMEN PELVIS FINDINGS Hepatobiliary:  Moderate hepatic steatosis is seen but decreased since previous study. No liver masses identified. Prior cholecystectomy noted. No evidence of biliary dilatation. Pancreas: No mass, inflammatory changes, or other significant abnormality. Spleen: Within normal limits in size and appearance. Adrenals/Urinary Tract: Normal adrenal glands. 2.5 cm low-attenuation lesion is seen in the upper pole left kidney on image 20/8, and low-attenuation lesion measuring 2.2 cm is seen in the upper pole of the right kidney on image 23/8. These are new since previous study, are ill-defined, and do not meet criteria for benign cysts. Differential  diagnosis includes renal metastases, renal cell carcinomas, and renal abscesses. No evidence of hydronephrosis. Unopacified urinary bladder is unremarkable in appearance. Stomach/Bowel: No evidence of obstruction, inflammatory process, or abnormal fluid collections. Vascular/Lymphatic: No pathologically enlarged lymph nodes. No evidence of abdominal aortic aneurysm. Aortic atherosclerosis noted. Reproductive: No mass or other significant abnormality. Other: None. Musculoskeletal:  No suspicious bone lesions identified. IMPRESSION: 3.7 cm mass in medial left upper lobe with adjacent mediastinal invasion. This is highly suspicious for primary bronchogenic carcinoma. No evidence of thoracic lymphadenopathy or pleural effusion. New ill-defined low-attenuation masses in both kidneys measuring 2-3 cm. Differential diagnosis includes renal metastases, primary renal cell carcinomas, and renal abscesses. Consider abdomen MRI without and with contrast for further characterization. No other sites of metastatic disease identified within the abdomen or pelvis. Decreased hepatic steatosis since prior study. Aortic atherosclerosis and coronary artery calcification. Electronically Signed   By: Earle Gell M.D.   On: 04/01/2016 17:19   Ct Abdomen Pelvis W Contrast  Result Date: 04/01/2016 CLINICAL DATA:   Multiple falls. Hemorrhagic brain metastases seen on recent CT. Diabetes. EXAM: CT CHEST, ABDOMEN, AND PELVIS WITH CONTRAST TECHNIQUE: Multidetector CT imaging of the chest, abdomen and pelvis was performed following the standard protocol during bolus administration of intravenous contrast. CONTRAST:  150m ISOVUE-300 IOPAMIDOL (ISOVUE-300) INJECTION 61% COMPARISON:  AP CT on 07/25/2005 FINDINGS: CT CHEST FINDINGS Cardiovascular: Normal heart size. Mild LAD coronary artery calcification seen. Aortic atherosclerosis noted. Mediastinum/Lymph Nodes: No pathologically enlarged lymph nodes identified. Lungs/Pleura: An irregular pulmonary masses seen in the medial left upper lobe which shows invasion of adjacent mediastinal fat. This measures 3.7 x 2.9 cm on image 13/2. This is suspicious for primary bronchogenic carcinoma. 5 mm perifissural nodule in the right lung along the minor fissure on image 72/series 7 is most consistent with a benign intrapulmonary lymph node. No evidence of pleural effusion. Musculoskeletal: No chest wall mass or suspicious bone lesions identified. CT ABDOMEN PELVIS FINDINGS Hepatobiliary: Moderate hepatic steatosis is seen but decreased since previous study. No liver masses identified. Prior cholecystectomy noted. No evidence of biliary dilatation. Pancreas: No mass, inflammatory changes, or other significant abnormality. Spleen: Within normal limits in size and appearance. Adrenals/Urinary Tract: Normal adrenal glands. 2.5 cm low-attenuation lesion is seen in the upper pole left kidney on image 20/8, and low-attenuation lesion measuring 2.2 cm is seen in the upper pole of the right kidney on image 23/8. These are new since previous study, are ill-defined, and do not meet criteria for benign cysts. Differential diagnosis includes renal metastases, renal cell carcinomas, and renal abscesses. No evidence of hydronephrosis. Unopacified urinary bladder is unremarkable in appearance. Stomach/Bowel:  No evidence of obstruction, inflammatory process, or abnormal fluid collections. Vascular/Lymphatic: No pathologically enlarged lymph nodes. No evidence of abdominal aortic aneurysm. Aortic atherosclerosis noted. Reproductive: No mass or other significant abnormality. Other: None. Musculoskeletal:  No suspicious bone lesions identified. IMPRESSION: 3.7 cm mass in medial left upper lobe with adjacent mediastinal invasion. This is highly suspicious for primary bronchogenic carcinoma. No evidence of thoracic lymphadenopathy or pleural effusion. New ill-defined low-attenuation masses in both kidneys measuring 2-3 cm. Differential diagnosis includes renal metastases, primary renal cell carcinomas, and renal abscesses. Consider abdomen MRI without and with contrast for further characterization. No other sites of metastatic disease identified within the abdomen or pelvis. Decreased hepatic steatosis since prior study. Aortic atherosclerosis and coronary artery calcification. Electronically Signed   By: JEarle GellM.D.   On: 04/01/2016 17:19    EKG: Independently reviewed.  Sinus rhythm, RSR' in V1 and V2  Assessment/Plan  1. Falls, confusion, left-sided weakness  - Intermittent confusion reported for the past month or so - Multiple falls over the last 24 hrs when bending over or turning quickly  - Pt reports mild numbness and weakness in LUE  - Secondary to likely brain metastases with hemorrhage and edema as discussed below    2. Lung mass - 3.7 cm mass noted in medial LUL on CT with invasion into mediastinum  - Suspected to be a primary bronchogenic carcinoma  - Pt endorses FHx of lung cancer and has significant secondary smoke exposure from husband - May be amenable to biopsy through bronchoscopy    3. Brain metastases with hemorrhage - Multiple brain hemorrhages noted on CT, largest of which is 2.8 x 2.5 x 2.6 cm at the genu of Rt internal capsule with mass effect on the third ventricle and midline  shift of 7 mm  - Neurosurgery consulting and much appreciated; consultant feels that there is only a small hemorrhage component to these masses and floor bed is adequate - Frequent neuro checks, NPO until swallow screen passed  - Keep SBP <180 and MAP <130 - Decadron 10 mg IV now, followed by 4 mg IV q6h - VTE ppx with SCD's    4. Renal masses  - Bilateral renal masses noted on CT, possibly metastatic  - Abdomen MRI with & without contrast ordered to further characterize   5. Type II DM  - A1c was 7.2% in 2011  - Managed at home with glipizide and metformin; will hold these while in hospital  - Serum glucose was 327 on admission and 10 units IV Novolog administered in ED  - Check CBG q4h; change to with meals and qHS once appropriate for a diet  - Moderate-intensity SSI correctional to start; will adjust prn   6. Hypertension - At goal currently  - Continue current management with Toprol  - Labetalol IVP's available prn    7. GERD - Stable, managed with daily Prilosec at home  - Continue PPI tx with Protonix while in hospital   8. Leukocytosis - WBC 13,000 on admission without fever or apparent focus of infection  - Suspect this is secondary to the malignant process  - Culture if febrile    DVT prophylaxis: SCD's  Code Status: Full  Family Communication: Family updated at bedside   Disposition Plan: Admit to med-surg at [place] called: Neurosurgery   Admission status: Inpatient     Vianne Bulls, MD [facility] Pager [phone]  If 7PM-7AM, please contact night-coverage [email or web address] Password [facility]  04/01/2016, 7:04 PM

## 2016-04-02 ENCOUNTER — Inpatient Hospital Stay (HOSPITAL_COMMUNITY): Payer: PPO

## 2016-04-02 DIAGNOSIS — F29 Unspecified psychosis not due to a substance or known physiological condition: Secondary | ICD-10-CM

## 2016-04-02 DIAGNOSIS — K210 Gastro-esophageal reflux disease with esophagitis: Secondary | ICD-10-CM

## 2016-04-02 DIAGNOSIS — C7949 Secondary malignant neoplasm of other parts of nervous system: Secondary | ICD-10-CM

## 2016-04-02 DIAGNOSIS — J45909 Unspecified asthma, uncomplicated: Secondary | ICD-10-CM

## 2016-04-02 DIAGNOSIS — N289 Disorder of kidney and ureter, unspecified: Secondary | ICD-10-CM

## 2016-04-02 LAB — GLUCOSE, CAPILLARY
GLUCOSE-CAPILLARY: 209 mg/dL — AB (ref 65–99)
GLUCOSE-CAPILLARY: 279 mg/dL — AB (ref 65–99)
Glucose-Capillary: 195 mg/dL — ABNORMAL HIGH (ref 65–99)
Glucose-Capillary: 161 mg/dL — ABNORMAL HIGH (ref 65–99)

## 2016-04-02 LAB — MRSA PCR SCREENING: MRSA BY PCR: NEGATIVE

## 2016-04-02 MED ORDER — GADOBENATE DIMEGLUMINE 529 MG/ML IV SOLN
17.0000 mL | Freq: Once | INTRAVENOUS | Status: AC | PRN
  Administered 2016-04-02: 17 mL via INTRAVENOUS

## 2016-04-02 NOTE — Evaluation (Signed)
Occupational Therapy Evaluation Patient Details Name: [name] MRN: [id number] DOB: 12/27/1950 Today's Date: 04/02/2016    History of Present Illness [name] is a 65 y.o. female with medical history significant for type 2 diabetes mellitus, hypertension, GERD, and osteoarthritis who presents the emergency department with one month of intermittent confusion and multiple falls over the last 24 hours.   CT of the head without contrast was obtained and notable for multiple brain hemorrhages and a pattern suggestive of metastatic disease. CT of the abdomen and pelvis was obtained, looking for a source of metastases and a 3.7 cm mass was identified in the medial left upper lobe with adjacent mediastinal invasion suspicious for primary bronchogenic carcinoma. New ill-defined low attenuation masses were also noted in the bilateral kidneys, possibly metastases   Clinical Impression   This 65 yo female admitted and found to have above presents to acute OT with deficits below (see OT problem list) thus affecting her PLOF of totally independent with basic ADLs and IADLs (including driving). She will benefit from acute OT with follow up OT on CIR (if this gets denied then pt is open to SNF, family does not feel like husband can take care of her adequately at home).    Follow Up Recommendations  CIR    Equipment Recommendations  None recommended by OT       Precautions / Restrictions Precautions Precautions: Fall Restrictions Weight Bearing Restrictions: No      Mobility Bed Mobility       General bed mobility comments: pt close to finishing up with PT when I arrived  Transfers Overall transfer level: Needs assistance Equipment used: None Transfers: Sit to/from Stand Sit to Stand: Min assist            Balance Overall balance assessment: Needs assistance;History of Falls Sitting-balance support: No upper extremity supported;Feet supported Sitting balance-Leahy Scale:  Good     Standing balance support: Bilateral upper extremity supported;During functional activity Standing balance-Leahy Scale: Fair Standing balance comment: dynamically does better with RW than without                            ADL Overall ADL's : Needs assistance/impaired Eating/Feeding: Modified independent;Sitting Eating/Feeding Details (indicate cue type and reason): increased time to get self set up Grooming: Minimal assistance;Standing   Upper Body Bathing: Set up;Sitting Upper Body Bathing Details (indicate cue type and reason): mild increased time due to mild weakness of LUE Lower Body Bathing: Minimal assistance;Sit to/from stand   Upper Body Dressing : Minimal assistance   Lower Body Dressing: Minimal assistance;Sit to/from stand   Toilet Transfer: Minimal assistance;Ambulation Toilet Transfer Details (indicate cue type and reason): without RW and as she does more her LLE fatigues more Toileting- Clothing Manipulation and Hygiene: Minimal assistance;Sit to/from stand               Vision Vision Assessment?: Yes Eye Alignment:  (appears disconjugate at times, but pt does not report double vision when asked) Ocular Range of Motion: Within Functional Limits Alignment/Gaze Preference: Within Defined Limits Tracking/Visual Pursuits: Able to track stimulus in all quads without difficulty Convergence: Within functional limits Visual Fields: No apparent deficits          Pertinent Vitals/Pain Pain Assessment: No/denies pain     Hand Dominance Right (but does somethings left (ie: tying shoes))   Extremity/Trunk Assessment Upper Extremity Assessment Upper Extremity Assessment: LUE deficits/detail LUE Deficits / Details:  mild lag of all movements with compared simultaneously to RUE; 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 I reminded her that she fell since admission so did she think she should be getting up by herself and she, "probably not a good idea". When asked she says she does not feel like she is safe to drive (and I agree). We also discussed that cooking in the kitchen (stove and stove top) is not safe either with memory and decreased strength in LUE. Sister in room and I made her and pt aware that we (therapy) did not feel that pt would be safe without 24/7 S   General Comments       Exercises   Other Exercises Other Exercises: taped up a washcloth into a ball and had her work on transferring if from one hand to other with shoudlers at 90 degrees--the closer she got to 10 reps the lower her left arm dropped--I brought this to her attention once at about the 5th rep at which point she raised it back up but then it started to drop back down again. She also practiced using an open washcloth like pizza dough and tossed it from one hand to the other (said her dad taught her how to do this when she was little)   Shoulder Instructions      Home Living Family/patient expects to be discharged to:: Inpatient rehab Living Arrangements: Children Available Help at Discharge: Family;Available 24 hours/day Type of Home: House             Bathroom Shower/Tub: Tub/shower unit;Curtain Shower/tub characteristics: Architectural technologist: Standard     Home Equipment: Grab bars - tub/shower;Grab bars - toilet (also reports that she has access to a tub seat with back)      Lives  With: Spouse    Prior Functioning/Environment Level of Independence: Independent             OT Diagnosis: Generalized weakness;Hemiplegia non-dominant side   OT Problem List: Decreased strength;Impaired UE functional use;Impaired balance (sitting and/or standing);Decreased safety awareness   OT Treatment/Interventions: Self-care/ADL training;Therapeutic exercise;Therapeutic activities;Balance training;Patient/family education    OT Goals(Current goals can be found in the care plan section) Acute Rehab OT Goals Patient Stated Goal: to get this figured out so I can know what my treatment options are OT Goal Formulation: With patient Time For Goal Achievement: 04/16/16 Potential to Achieve Goals: Good  OT Frequency: Min 3X/week           End of Session Equipment Utilized During Treatment: Gait belt;Rolling walker  Activity Tolerance: Patient tolerated treatment well Patient left: in chair;with call bell/phone within reach   Time: 1410-1445 OT Time Calculation (min): 35 min Charges:  OT General Charges $OT Visit: 1 Procedure OT Evaluation $OT Eval Moderate Complexity: 1 Procedure OT Treatments $Self Care/Home Management : 8-22 mins  Almon Register [phone] 04/02/2016, 3:06 PM

## 2016-04-02 NOTE — Consult Note (Signed)
Reason for Consult: Metastatic brain tumor Referring Physician: Dr. Feliz Beam [name] is an 65 y.o. female.  HPI: Patient is a 65 year old individual who had been decompensating for several days but yesterday had had a number of falls. She is brought to the emergency room it [facility] where a CT scan demonstrated a presence of multiple intracranial lesions including a large right basal ganglia thalamic lesion measuring at least 2-1/2 cm in diameter causing some right to left shift. There are other smaller lesions in the left hemisphere and in the posterior fossa. A CT scan was performed which demonstrates presence of a lung mass. The CT scan did not have any contrast-enhancement given but the lesions are hyperdense. Metastatic carcinoma is the leading diagnosis and the patient is undergoing further workup for this new diagnosis. She will require an MRI of the brain.  Past Medical History:  Diagnosis Date  . Anxiety   . Asthma   . Bronchitis   . Depression   . Essential hypertension   . GERD (gastroesophageal reflux disease)   . Hyperlipidemia   . Lichen sclerosus   . Osteoarthritis   . SVT (supraventricular tachycardia) (HCC)    No inducible arrhythmias by EP study 11/11  . Type 2 diabetes mellitus ([place])     Past Surgical History:  Procedure Laterality Date  . CESAREAN SECTION WITH BILATERAL TUBAL LIGATION  1978  . CHOLECYSTECTOMY  2007  . EP study  11/11    Family History  Problem Relation Age of Onset  . Hypertension Father   . COPD Father   . Parkinson's disease Father   . Hypertension Mother     Social History:  reports that she has quit smoking. Her smoking use included Cigarettes. She has never used smokeless tobacco. She reports that she does not drink alcohol or use drugs.  Allergies: No Known Allergies  Medications: I have reviewed the patient's current medications.  Results for orders placed or performed during the hospital encounter of 04/01/16  (from the past 48 hour(s))  Comprehensive metabolic panel     Status: Abnormal   Collection Time: 04/01/16 12:10 PM  Result Value Ref Range   Sodium 134 (L) 135 - 145 mmol/L   Potassium 4.1 3.5 - 5.1 mmol/L    Comment: SLIGHT HEMOLYSIS   Chloride 100 (L) 101 - 111 mmol/L   CO2 24 22 - 32 mmol/L   Glucose, Bld 327 (H) 65 - 99 mg/dL   BUN 9 6 - 20 mg/dL   Creatinine, Ser 0.64 0.44 - 1.00 mg/dL   Calcium 8.7 (L) 8.9 - 10.3 mg/dL   Total Protein 7.1 6.5 - 8.1 g/dL   Albumin 3.8 3.5 - 5.0 g/dL   AST 22 15 - 41 U/L   ALT 16 14 - 54 U/L   Alkaline Phosphatase 60 38 - 126 U/L   Total Bilirubin 1.2 0.3 - 1.2 mg/dL   GFR calc non Af Amer >60 >60 mL/min   GFR calc Af Amer >60 >60 mL/min    Comment: (NOTE) The eGFR has been calculated using the CKD EPI equation. This calculation has not been validated in all clinical situations. eGFR's persistently <60 mL/min signify possible Chronic Kidney Disease.    Anion gap 10 5 - 15  CBC     Status: Abnormal   Collection Time: 04/01/16 12:10 PM  Result Value Ref Range   WBC 13.0 (H) 4.0 - 10.5 K/uL   RBC 4.14 3.87 - 5.11 MIL/uL  Hemoglobin 11.9 (L) 12.0 - 15.0 g/dL   HCT 36.0 36.0 - 46.0 %   MCV 87.0 78.0 - 100.0 fL   MCH 28.7 26.0 - 34.0 pg   MCHC 33.1 30.0 - 36.0 g/dL   RDW 13.1 11.5 - 15.5 %   Platelets 323 150 - 400 K/uL  CBG monitoring, ED     Status: Abnormal   Collection Time: 04/01/16 12:29 PM  Result Value Ref Range   Glucose-Capillary 388 (H) 65 - 99 mg/dL  Ethanol     Status: None   Collection Time: 04/01/16 12:56 PM  Result Value Ref Range   Alcohol, Ethyl (B) <5 <5 mg/dL    Comment:        LOWEST DETECTABLE LIMIT FOR SERUM ALCOHOL IS 5 mg/dL FOR MEDICAL PURPOSES ONLY   Troponin I     Status: None   Collection Time: 04/01/16 12:56 PM  Result Value Ref Range   Troponin I <0.03 <0.03 ng/mL  Protime-INR     Status: None   Collection Time: 04/01/16 12:56 PM  Result Value Ref Range   Prothrombin Time 13.7 11.4 - 15.2  seconds   INR 1.05   APTT     Status: None   Collection Time: 04/01/16 12:56 PM  Result Value Ref Range   aPTT 29 24 - 36 seconds  CBG monitoring, ED     Status: Abnormal   Collection Time: 04/01/16  2:21 PM  Result Value Ref Range   Glucose-Capillary 251 (H) 65 - 99 mg/dL  Urine rapid drug screen (hosp performed)     Status: Abnormal   Collection Time: 04/01/16  2:28 PM  Result Value Ref Range   Opiates NONE DETECTED NONE DETECTED   Cocaine NONE DETECTED NONE DETECTED   Benzodiazepines POSITIVE (A) NONE DETECTED   Amphetamines NONE DETECTED NONE DETECTED   Tetrahydrocannabinol NONE DETECTED NONE DETECTED   Barbiturates NONE DETECTED NONE DETECTED    Comment:        DRUG SCREEN FOR MEDICAL PURPOSES ONLY.  IF CONFIRMATION IS NEEDED FOR ANY PURPOSE, NOTIFY LAB WITHIN 5 DAYS.        LOWEST DETECTABLE LIMITS FOR URINE DRUG SCREEN Drug Class       Cutoff (ng/mL) Amphetamine      1000 Barbiturate      200 Benzodiazepine   793 Tricyclics       903 Opiates          300 Cocaine          300 THC              50   Urinalysis, Routine w reflex microscopic (not at [facility])     Status: Abnormal   Collection Time: 04/01/16  2:28 PM  Result Value Ref Range   Color, Urine YELLOW YELLOW   APPearance HAZY (A) CLEAR   Specific Gravity, Urine 1.025 1.005 - 1.030   pH 6.0 5.0 - 8.0   Glucose, UA 500 (A) NEGATIVE mg/dL   Hgb urine dipstick LARGE (A) NEGATIVE   Bilirubin Urine NEGATIVE NEGATIVE   Ketones, ur TRACE (A) NEGATIVE mg/dL   Protein, ur TRACE (A) NEGATIVE mg/dL   Nitrite NEGATIVE NEGATIVE   Leukocytes, UA TRACE (A) NEGATIVE  Urine microscopic-add on     Status: Abnormal   Collection Time: 04/01/16  2:28 PM  Result Value Ref Range   Squamous Epithelial / LPF 0-5 (A) NONE SEEN   WBC, UA 6-30 0 - 5 WBC/hpf   RBC /  HPF 6-30 0 - 5 RBC/hpf   Bacteria, UA FEW (A) NONE SEEN   Urine-Other YEAST PRESENT   CBG monitoring, ED     Status: Abnormal   Collection Time: 04/01/16  8:22 PM   Result Value Ref Range   Glucose-Capillary 252 (H) 65 - 99 mg/dL  MRSA PCR Screening     Status: None   Collection Time: 04/01/16 10:40 PM  Result Value Ref Range   MRSA by PCR NEGATIVE NEGATIVE    Comment:        The GeneXpert MRSA Assay (FDA approved for NASAL specimens only), is one component of a comprehensive MRSA colonization surveillance program. It is not intended to diagnose MRSA infection nor to guide or monitor treatment for MRSA infections.   Glucose, capillary     Status: Abnormal   Collection Time: 04/01/16 10:56 PM  Result Value Ref Range   Glucose-Capillary 275 (H) 65 - 99 mg/dL  Glucose, capillary     Status: Abnormal   Collection Time: 04/02/16  3:49 AM  Result Value Ref Range   Glucose-Capillary 209 (H) 65 - 99 mg/dL    Dg Chest 2 View  Result Date: 04/01/2016 CLINICAL DATA:  Chest pain and dizziness. Several falls in last 24 hours. EXAM: CHEST  2 VIEW COMPARISON:  08/26/2015 FINDINGS: The heart size and mediastinal contours are within normal limits. Both lungs are clear. The visualized skeletal structures are unremarkable. IMPRESSION: No active cardiopulmonary disease. Electronically Signed   By: Earle Gell M.D.   On: 04/01/2016 13:38   Ct Head Wo Contrast  Result Date: 04/01/2016 CLINICAL DATA:  Reported pt with multiple falls in last 24 hours and started calling people in middle of night. "feels like screaming" and wants people to do want she wants. Hx dm. EXAM: CT HEAD WITHOUT CONTRAST TECHNIQUE: Contiguous axial images were obtained from the base of the skull through the vertex without intravenous contrast. COMPARISON:  None. FINDINGS: There are multiple brain hemorrhages. In the right base a ganglia centered in the region of the genu of the right internal capsule, there is a large round hemorrhage measuring 2.8 x 2.5 x 2.6 cm. This has mass effect mostly facing the third ventricle and shifting the midline structures to the left by 7 mm. There is  surrounding vasogenic edema. There are multiple small hemorrhages along the corticomedullary junctions. There is a hemorrhage in the anterior left temporal lobe another smaller hemorrhage in the posterior left occipital lobe another in the right frontal lobe, 1 in the right cerebellum and a smaller along the peripheral left cerebellum. Another area of hemorrhage lies along the inferior margin of the temporal horn of the right lateral ventricle in the right temporal lobe. There is mild prominence of the lateral ventricles, most notably the atria and temporal horns. This suggests a mild degree of obstructive hydrocephalus. There is mild patchy white matter hypoattenuation consistent with chronic microvascular ischemic change. There is no evidence of an ischemic infarct. There are no extra-axial masses or abnormal fluid collections. No skull lesion. The visualized sinuses and mastoid air cells are clear. IMPRESSION: 1. Multiple brain hemorrhages. Given the pattern, hemorrhagic metastatic disease is suspected. Alternatively, the large right base a ganglia hemorrhage may be a hypertensive hemorrhage. Other corticomedullary junction hemorrhages could potentially be traumatic in origin. 2. Recommend nonemergent follow-up chest, abdomen and pelvis CT to assess for neoplastic disease. Electronically Signed   By: Lajean Manes M.D.   On: 04/01/2016 14:08   Ct Chest W  Contrast  Result Date: 04/01/2016 CLINICAL DATA:  Multiple falls. Hemorrhagic brain metastases seen on recent CT. Diabetes. EXAM: CT CHEST, ABDOMEN, AND PELVIS WITH CONTRAST TECHNIQUE: Multidetector CT imaging of the chest, abdomen and pelvis was performed following the standard protocol during bolus administration of intravenous contrast. CONTRAST:  155m ISOVUE-300 IOPAMIDOL (ISOVUE-300) INJECTION 61% COMPARISON:  AP CT on 07/25/2005 FINDINGS: CT CHEST FINDINGS Cardiovascular: Normal heart size. Mild LAD coronary artery calcification seen. Aortic  atherosclerosis noted. Mediastinum/Lymph Nodes: No pathologically enlarged lymph nodes identified. Lungs/Pleura: An irregular pulmonary masses seen in the medial left upper lobe which shows invasion of adjacent mediastinal fat. This measures 3.7 x 2.9 cm on image 13/2. This is suspicious for primary bronchogenic carcinoma. 5 mm perifissural nodule in the right lung along the minor fissure on image 72/series 7 is most consistent with a benign intrapulmonary lymph node. No evidence of pleural effusion. Musculoskeletal: No chest wall mass or suspicious bone lesions identified. CT ABDOMEN PELVIS FINDINGS Hepatobiliary: Moderate hepatic steatosis is seen but decreased since previous study. No liver masses identified. Prior cholecystectomy noted. No evidence of biliary dilatation. Pancreas: No mass, inflammatory changes, or other significant abnormality. Spleen: Within normal limits in size and appearance. Adrenals/Urinary Tract: Normal adrenal glands. 2.5 cm low-attenuation lesion is seen in the upper pole left kidney on image 20/8, and low-attenuation lesion measuring 2.2 cm is seen in the upper pole of the right kidney on image 23/8. These are new since previous study, are ill-defined, and do not meet criteria for benign cysts. Differential diagnosis includes renal metastases, renal cell carcinomas, and renal abscesses. No evidence of hydronephrosis. Unopacified urinary bladder is unremarkable in appearance. Stomach/Bowel: No evidence of obstruction, inflammatory process, or abnormal fluid collections. Vascular/Lymphatic: No pathologically enlarged lymph nodes. No evidence of abdominal aortic aneurysm. Aortic atherosclerosis noted. Reproductive: No mass or other significant abnormality. Other: None. Musculoskeletal:  No suspicious bone lesions identified. IMPRESSION: 3.7 cm mass in medial left upper lobe with adjacent mediastinal invasion. This is highly suspicious for primary bronchogenic carcinoma. No evidence of  thoracic lymphadenopathy or pleural effusion. New ill-defined low-attenuation masses in both kidneys measuring 2-3 cm. Differential diagnosis includes renal metastases, primary renal cell carcinomas, and renal abscesses. Consider abdomen MRI without and with contrast for further characterization. No other sites of metastatic disease identified within the abdomen or pelvis. Decreased hepatic steatosis since prior study. Aortic atherosclerosis and coronary artery calcification. Electronically Signed   By: JEarle GellM.D.   On: 04/01/2016 17:19   Ct Abdomen Pelvis W Contrast  Result Date: 04/01/2016 CLINICAL DATA:  Multiple falls. Hemorrhagic brain metastases seen on recent CT. Diabetes. EXAM: CT CHEST, ABDOMEN, AND PELVIS WITH CONTRAST TECHNIQUE: Multidetector CT imaging of the chest, abdomen and pelvis was performed following the standard protocol during bolus administration of intravenous contrast. CONTRAST:  1053mISOVUE-300 IOPAMIDOL (ISOVUE-300) INJECTION 61% COMPARISON:  AP CT on 07/25/2005 FINDINGS: CT CHEST FINDINGS Cardiovascular: Normal heart size. Mild LAD coronary artery calcification seen. Aortic atherosclerosis noted. Mediastinum/Lymph Nodes: No pathologically enlarged lymph nodes identified. Lungs/Pleura: An irregular pulmonary masses seen in the medial left upper lobe which shows invasion of adjacent mediastinal fat. This measures 3.7 x 2.9 cm on image 13/2. This is suspicious for primary bronchogenic carcinoma. 5 mm perifissural nodule in the right lung along the minor fissure on image 72/series 7 is most consistent with a benign intrapulmonary lymph node. No evidence of pleural effusion. Musculoskeletal: No chest wall mass or suspicious bone lesions identified. CT ABDOMEN PELVIS FINDINGS Hepatobiliary: Moderate  hepatic steatosis is seen but decreased since previous study. No liver masses identified. Prior cholecystectomy noted. No evidence of biliary dilatation. Pancreas: No mass, inflammatory  changes, or other significant abnormality. Spleen: Within normal limits in size and appearance. Adrenals/Urinary Tract: Normal adrenal glands. 2.5 cm low-attenuation lesion is seen in the upper pole left kidney on image 20/8, and low-attenuation lesion measuring 2.2 cm is seen in the upper pole of the right kidney on image 23/8. These are new since previous study, are ill-defined, and do not meet criteria for benign cysts. Differential diagnosis includes renal metastases, renal cell carcinomas, and renal abscesses. No evidence of hydronephrosis. Unopacified urinary bladder is unremarkable in appearance. Stomach/Bowel: No evidence of obstruction, inflammatory process, or abnormal fluid collections. Vascular/Lymphatic: No pathologically enlarged lymph nodes. No evidence of abdominal aortic aneurysm. Aortic atherosclerosis noted. Reproductive: No mass or other significant abnormality. Other: None. Musculoskeletal:  No suspicious bone lesions identified. IMPRESSION: 3.7 cm mass in medial left upper lobe with adjacent mediastinal invasion. This is highly suspicious for primary bronchogenic carcinoma. No evidence of thoracic lymphadenopathy or pleural effusion. New ill-defined low-attenuation masses in both kidneys measuring 2-3 cm. Differential diagnosis includes renal metastases, primary renal cell carcinomas, and renal abscesses. Consider abdomen MRI without and with contrast for further characterization. No other sites of metastatic disease identified within the abdomen or pelvis. Decreased hepatic steatosis since prior study. Aortic atherosclerosis and coronary artery calcification. Electronically Signed   By: Earle Gell M.D.   On: 04/01/2016 17:19    ROS Blood pressure (!) 96/38, pulse 70, temperature 100.1 F (37.8 C), temperature source Oral, resp. rate 19, height 5' 2"  (1.575 m), weight 81.2 kg (179 lb 0.2 oz), SpO2 94 %. Physical Exam  Assessment/Plan: Multiple metastatic lesions with the largest being  a 2-1/2 cm lesion in the right basal ganglia.  Patient will require an MRI of the brain to better characterize these lesions tissue diagnosis may be obtained from more easily sourced tumor.  Dinia Joynt J 04/02/2016, 7:38 AM

## 2016-04-02 NOTE — Consult Note (Signed)
Chief Complaint: Patient was seen in consultation today for CT-guided left psoas lesion biopsy Chief Complaint  Patient presents with  . Altered Mental Status    Referring Physician(s): Rama,C  Supervising Physician: Corrie Mckusick  Patient Status: Inpatient  History of Present Illness: [name] is a 65 y.o. female with past medical history of prior tobacco use, diabetes, hypertension, GERD, osteoarthritis, asthma who was admitted to the hospital on 8/13 with confusion and falls at home in addition to mild left sided weakness/numbness. Subsequent imaging studies have now revealed multiple hyperdense brain metastasis, 3.7 cm left upper lobe lung mass, bilateral renal masses, right paraspinal and left psoas lesions. Patient has no documented history of cancer. Request now received from primary service for CT-guided biopsy of most accessible site.  Past Medical History:  Diagnosis Date  . Anxiety   . Asthma   . Bronchitis   . Depression   . Essential hypertension   . GERD (gastroesophageal reflux disease)   . Hyperlipidemia   . Lichen sclerosus   . Osteoarthritis   . SVT (supraventricular tachycardia) (HCC)    No inducible arrhythmias by EP study 11/11  . Type 2 diabetes mellitus ([place])     Past Surgical History:  Procedure Laterality Date  . CESAREAN SECTION WITH BILATERAL TUBAL LIGATION  1978  . CHOLECYSTECTOMY  2007  . EP study  11/11    Allergies: Review of patient's allergies indicates no known allergies.  Medications: Prior to Admission medications   Medication Sig Start Date End Date Taking? Authorizing Provider  acetaminophen (TYLENOL) 500 MG tablet Take 500 mg by mouth every 6 (six) hours as needed for mild pain.    Yes Historical Provider, MD  albuterol (PROAIR HFA) 108 (90 BASE) MCG/ACT inhaler Inhale 1 puff into the lungs every 6 (six) hours as needed for wheezing or shortness of breath.   Yes Historical Provider, MD  aspirin EC 81 MG tablet Take  81 mg by mouth at bedtime.    Yes Historical Provider, MD  B-Complex TABS Take 1 tablet by mouth daily.   Yes Historical Provider, MD  Biotin 5000 MCG CAPS Take 1 capsule by mouth 2 (two) times daily.   Yes Historical Provider, MD  budesonide-formoterol (SYMBICORT) 160-4.5 MCG/ACT inhaler Inhale 2 puffs into the lungs 2 (two) times daily.   Yes Historical Provider, MD  Calcium-Magnesium-Vitamin D 200-100-33.3 MG-MG-UNIT CAPS Take 1 capsule by mouth daily.   Yes Historical Provider, MD  clobetasol (TEMOVATE) 0.05 % cream Apply 1 application topically 2 (two) times daily.    Yes Historical Provider, MD  DHA-EPA-Flaxseed Oil-Vitamin E (THERA TEARS NUTRITION PO) Take 1 drop by mouth daily.   Yes Historical Provider, MD  glipiZIDE (GLUCOTROL) 10 MG tablet Take 10 mg by mouth 2 (two) times daily.     Yes Historical Provider, MD  HYDROcodone-acetaminophen (NORCO/VICODIN) 5-325 MG tablet Take 1 tablet by mouth 2 (two) times daily as needed for moderate pain.  03/09/16  Yes Historical Provider, MD  lisinopril (PRINIVIL,ZESTRIL) 5 MG tablet Take 1 tablet (5 mg total) by mouth daily. 01/16/13  Yes Thompson Grayer, MD  LORazepam (ATIVAN) 0.5 MG tablet Take 0.5 mg by mouth every 8 (eight) hours as needed for anxiety or sleep.  01/22/14  Yes Historical Provider, MD  metFORMIN (GLUCOPHAGE) 850 MG tablet Take 850 mg by mouth 2 (two) times daily with a meal.    Yes Historical Provider, MD  metoprolol succinate (TOPROL XL) 25 MG 24 hr tablet Take  1 tablet (25 mg) in the am, and 1/2 tablet (12.5 mg) at bedtime Patient taking differently: Take 25 mg by mouth daily.  01/24/16  Yes Satira Sark, MD  Multiple Vitamin (MULITIVITAMIN WITH MINERALS) TABS Take 1 tablet by mouth daily.   Yes Historical Provider, MD  Omega-3 Fatty Acids (FISH OIL) 1000 MG CAPS Take 1 capsule by mouth 2 (two) times daily.    Yes Historical Provider, MD  omeprazole (PRILOSEC) 20 MG capsule Take 20 mg by mouth every other day.    Yes Historical  Provider, MD  simvastatin (ZOCOR) 20 MG tablet Take 20 mg by mouth every evening.   Yes Historical Provider, MD  tacrolimus (PROTOPIC) 0.1 % ointment Apply 1 application topically 2 (two) times daily.   Yes Historical Provider, MD  naproxen (NAPROSYN) 375 MG tablet Take 1 tablet (375 mg total) by mouth 2 (two) times daily. Patient not taking: Reported on 04/01/2016 0/4/54   Delora Fuel, MD  predniSONE (DELTASONE) 10 MG tablet Take 6 tablets (60 mg total) by mouth daily. Patient not taking: Reported on 04/01/2016 08/26/15   Jola Schmidt, MD     Family History  Problem Relation Age of Onset  . Hypertension Father   . COPD Father   . Parkinson's disease Father   . Hypertension Mother     Social History   Social History  . Marital status: Married    Spouse name: N/A  . Number of children: N/A  . Years of education: N/A   Social History Main Topics  . Smoking status: Former Smoker    Types: Cigarettes  . Smokeless tobacco: Never Used     Comment: quit in 1980's  . Alcohol use No  . Drug use: No     Comment: Prior history of drug use  . Sexual activity: Yes    Birth control/ protection: Post-menopausal   Other Topics Concern  . None   Social History Narrative   Married, 3 children. Lives in [place], unemployed.       Review of Systems see above. Patient currently denies fever, headache, chest pain, dyspnea, cough, abdominal pain, vomiting or abnormal bleeding. She does have occasional nausea.  Vital Signs: BP (!) 141/88   Pulse 63   Temp 99.3 F (37.4 C) (Oral)   Resp 16   Ht '5\' 2"'$  (1.575 m)   Wt 179 lb 0.2 oz (81.2 kg)   SpO2 97%   BMI 32.74 kg/m   Physical Exam patient currently awake/alert. Chest clear to auscultation bilaterally. Heart with regular rate and rhythm. Abdomen soft, positive bowel sounds, nontender. Lower extremities with no edema. Slightly diminished sensation in distal left upper extremity .                                         Mallampati Score:      Imaging: Dg Chest 2 View  Result Date: 04/01/2016 CLINICAL DATA:  Chest pain and dizziness. Several falls in last 24 hours. EXAM: CHEST  2 VIEW COMPARISON:  08/26/2015 FINDINGS: The heart size and mediastinal contours are within normal limits. Both lungs are clear. The visualized skeletal structures are unremarkable. IMPRESSION: No active cardiopulmonary disease. Electronically Signed   By: Earle Gell M.D.   On: 04/01/2016 13:38   Ct Head Wo Contrast  Addendum Date: 04/02/2016   ADDENDUM REPORT: 04/02/2016 07:37 ADDENDUM: Study discussed by telephone with Dr. Mallie Mussel  Elsner on 04/02/2016 at 0720 hours. We discussed that the multiple (at least 6) hyperdense lesions scattered in the brain are very round and discretely marginated. These are most compatible with brain masses. Their hyperdensity could beyond the basis of hypercellularity, or possibly some internal hemorrhage. However, there is no bona fide acute intracranial hemorrhage. There is edema surrounding the largest lesion measuring 28 mm which is centered at the right deep gray matter, and mild associated regional mass effect. CONCLUSION 1. Multiple hyperdense brain metastases. See also CT Chest Abdomen and Pelvis from the same day. 2. No acute intracranial hemorrhage. Electronically Signed   By: Genevie Ann M.D.   On: 04/02/2016 07:37   Result Date: 04/02/2016 CLINICAL DATA:  Reported pt with multiple falls in last 24 hours and started calling people in middle of night. "feels like screaming" and wants people to do want she wants. Hx dm. EXAM: CT HEAD WITHOUT CONTRAST TECHNIQUE: Contiguous axial images were obtained from the base of the skull through the vertex without intravenous contrast. COMPARISON:  None. FINDINGS: There are multiple brain hemorrhages. In the right base a ganglia centered in the region of the genu of the right internal capsule, there is a large round hemorrhage measuring 2.8 x 2.5 x 2.6 cm. This has mass effect mostly facing the  third ventricle and shifting the midline structures to the left by 7 mm. There is surrounding vasogenic edema. There are multiple small hemorrhages along the corticomedullary junctions. There is a hemorrhage in the anterior left temporal lobe another smaller hemorrhage in the posterior left occipital lobe another in the right frontal lobe, 1 in the right cerebellum and a smaller along the peripheral left cerebellum. Another area of hemorrhage lies along the inferior margin of the temporal horn of the right lateral ventricle in the right temporal lobe. There is mild prominence of the lateral ventricles, most notably the atria and temporal horns. This suggests a mild degree of obstructive hydrocephalus. There is mild patchy white matter hypoattenuation consistent with chronic microvascular ischemic change. There is no evidence of an ischemic infarct. There are no extra-axial masses or abnormal fluid collections. No skull lesion. The visualized sinuses and mastoid air cells are clear. IMPRESSION: 1. Multiple brain hemorrhages. Given the pattern, hemorrhagic metastatic disease is suspected. Alternatively, the large right base a ganglia hemorrhage may be a hypertensive hemorrhage. Other corticomedullary junction hemorrhages could potentially be traumatic in origin. 2. Recommend nonemergent follow-up chest, abdomen and pelvis CT to assess for neoplastic disease. Electronically Signed: By: Lajean Manes M.D. On: 04/01/2016 14:08   Ct Chest W Contrast  Result Date: 04/01/2016 CLINICAL DATA:  Multiple falls. Hemorrhagic brain metastases seen on recent CT. Diabetes. EXAM: CT CHEST, ABDOMEN, AND PELVIS WITH CONTRAST TECHNIQUE: Multidetector CT imaging of the chest, abdomen and pelvis was performed following the standard protocol during bolus administration of intravenous contrast. CONTRAST:  150m ISOVUE-300 IOPAMIDOL (ISOVUE-300) INJECTION 61% COMPARISON:  AP CT on 07/25/2005 FINDINGS: CT CHEST FINDINGS Cardiovascular:  Normal heart size. Mild LAD coronary artery calcification seen. Aortic atherosclerosis noted. Mediastinum/Lymph Nodes: No pathologically enlarged lymph nodes identified. Lungs/Pleura: An irregular pulmonary masses seen in the medial left upper lobe which shows invasion of adjacent mediastinal fat. This measures 3.7 x 2.9 cm on image 13/2. This is suspicious for primary bronchogenic carcinoma. 5 mm perifissural nodule in the right lung along the minor fissure on image 72/series 7 is most consistent with a benign intrapulmonary lymph node. No evidence of pleural effusion. Musculoskeletal: No chest wall  mass or suspicious bone lesions identified. CT ABDOMEN PELVIS FINDINGS Hepatobiliary: Moderate hepatic steatosis is seen but decreased since previous study. No liver masses identified. Prior cholecystectomy noted. No evidence of biliary dilatation. Pancreas: No mass, inflammatory changes, or other significant abnormality. Spleen: Within normal limits in size and appearance. Adrenals/Urinary Tract: Normal adrenal glands. 2.5 cm low-attenuation lesion is seen in the upper pole left kidney on image 20/8, and low-attenuation lesion measuring 2.2 cm is seen in the upper pole of the right kidney on image 23/8. These are new since previous study, are ill-defined, and do not meet criteria for benign cysts. Differential diagnosis includes renal metastases, renal cell carcinomas, and renal abscesses. No evidence of hydronephrosis. Unopacified urinary bladder is unremarkable in appearance. Stomach/Bowel: No evidence of obstruction, inflammatory process, or abnormal fluid collections. Vascular/Lymphatic: No pathologically enlarged lymph nodes. No evidence of abdominal aortic aneurysm. Aortic atherosclerosis noted. Reproductive: No mass or other significant abnormality. Other: None. Musculoskeletal:  No suspicious bone lesions identified. IMPRESSION: 3.7 cm mass in medial left upper lobe with adjacent mediastinal invasion. This is  highly suspicious for primary bronchogenic carcinoma. No evidence of thoracic lymphadenopathy or pleural effusion. New ill-defined low-attenuation masses in both kidneys measuring 2-3 cm. Differential diagnosis includes renal metastases, primary renal cell carcinomas, and renal abscesses. Consider abdomen MRI without and with contrast for further characterization. No other sites of metastatic disease identified within the abdomen or pelvis. Decreased hepatic steatosis since prior study. Aortic atherosclerosis and coronary artery calcification. Electronically Signed   By: Earle Gell M.D.   On: 04/01/2016 17:19   Mr Abdomen W Wo Contrast  Result Date: 04/02/2016 CLINICAL DATA:  Renal lesions on CT. Hemorrhagic brain metastasis. Multiple falls. EXAM: MRI ABDOMEN WITHOUT AND WITH CONTRAST TECHNIQUE: Multiplanar multisequence MR imaging of the abdomen was performed both before and after the administration of intravenous contrast. CONTRAST:  16m MULTIHANCE GADOBENATE DIMEGLUMINE 529 MG/ML IV SOLN COMPARISON:  Abdominal CT of 04/01/2016 FINDINGS: Mild to moderate motion degradation throughout. Lower chest: Normal heart size without pericardial or pleural effusion. Hepatobiliary: Moderate hepatic steatosis. No focal liver lesion. Cholecystectomy, without biliary ductal dilatation. Pancreas: Normal, without mass or ductal dilatation. Spleen: Normal in size, without focal abnormality. Adrenals/Urinary Tract: Normal adrenal glands. Too small to characterize lesions in both kidneys. 1.3 cm upper pole right renal lesion is T2 hypo intense and demonstrates mild post-contrast enhancement, including on image 56/ series 1003. A 2.0 cm upper pole left renal lesion is also T2 hypointense and demonstrates mild post-contrast enhancement, including on image 46/ series 1005. No hydronephrosis. Stomach/Bowel: Normal stomach and abdominal bowel loops. Vascular/Lymphatic: Normal caliber of the aorta and branch vessels. Patent renal  veins. No retroperitoneal or retrocrural adenopathy. Other: No ascites. Musculoskeletal: Right paraspinous muscular enhancing lesion measures 1.3 cm on image 15/series 4. A left high psoas enhancing nodule measures 1.8 cm on image 51/ series 1002. IMPRESSION: 1. Enhancing intramuscular lesions are most consistent with metastasis, presumably from the patient's primary lung cancer. 2. Bilateral hypoenhancing renal lesions with precontrast T2 hypointensity. Favor metastasis. Differential considerations include multiple papillary type renal cell carcinomas versus less likely lipid poor angiomyolipomas. 3. Motion degraded exam. 4. Hepatic steatosis. Electronically Signed   By: KAbigail MiyamotoM.D.   On: 04/02/2016 08:47   Ct Abdomen Pelvis W Contrast  Result Date: 04/01/2016 CLINICAL DATA:  Multiple falls. Hemorrhagic brain metastases seen on recent CT. Diabetes. EXAM: CT CHEST, ABDOMEN, AND PELVIS WITH CONTRAST TECHNIQUE: Multidetector CT imaging of the chest, abdomen and pelvis was  performed following the standard protocol during bolus administration of intravenous contrast. CONTRAST:  154m ISOVUE-300 IOPAMIDOL (ISOVUE-300) INJECTION 61% COMPARISON:  AP CT on 07/25/2005 FINDINGS: CT CHEST FINDINGS Cardiovascular: Normal heart size. Mild LAD coronary artery calcification seen. Aortic atherosclerosis noted. Mediastinum/Lymph Nodes: No pathologically enlarged lymph nodes identified. Lungs/Pleura: An irregular pulmonary masses seen in the medial left upper lobe which shows invasion of adjacent mediastinal fat. This measures 3.7 x 2.9 cm on image 13/2. This is suspicious for primary bronchogenic carcinoma. 5 mm perifissural nodule in the right lung along the minor fissure on image 72/series 7 is most consistent with a benign intrapulmonary lymph node. No evidence of pleural effusion. Musculoskeletal: No chest wall mass or suspicious bone lesions identified. CT ABDOMEN PELVIS FINDINGS Hepatobiliary: Moderate hepatic  steatosis is seen but decreased since previous study. No liver masses identified. Prior cholecystectomy noted. No evidence of biliary dilatation. Pancreas: No mass, inflammatory changes, or other significant abnormality. Spleen: Within normal limits in size and appearance. Adrenals/Urinary Tract: Normal adrenal glands. 2.5 cm low-attenuation lesion is seen in the upper pole left kidney on image 20/8, and low-attenuation lesion measuring 2.2 cm is seen in the upper pole of the right kidney on image 23/8. These are new since previous study, are ill-defined, and do not meet criteria for benign cysts. Differential diagnosis includes renal metastases, renal cell carcinomas, and renal abscesses. No evidence of hydronephrosis. Unopacified urinary bladder is unremarkable in appearance. Stomach/Bowel: No evidence of obstruction, inflammatory process, or abnormal fluid collections. Vascular/Lymphatic: No pathologically enlarged lymph nodes. No evidence of abdominal aortic aneurysm. Aortic atherosclerosis noted. Reproductive: No mass or other significant abnormality. Other: None. Musculoskeletal:  No suspicious bone lesions identified. IMPRESSION: 3.7 cm mass in medial left upper lobe with adjacent mediastinal invasion. This is highly suspicious for primary bronchogenic carcinoma. No evidence of thoracic lymphadenopathy or pleural effusion. New ill-defined low-attenuation masses in both kidneys measuring 2-3 cm. Differential diagnosis includes renal metastases, primary renal cell carcinomas, and renal abscesses. Consider abdomen MRI without and with contrast for further characterization. No other sites of metastatic disease identified within the abdomen or pelvis. Decreased hepatic steatosis since prior study. Aortic atherosclerosis and coronary artery calcification. Electronically Signed   By: JEarle GellM.D.   On: 04/01/2016 17:19    Labs:  CBC:  Recent Labs  04/01/16 1210  WBC 13.0*  HGB 11.9*  HCT 36.0  PLT  323    COAGS:  Recent Labs  04/01/16 1256  INR 1.05  APTT 29    BMP:  Recent Labs  04/01/16 1210  NA 134*  K 4.1  CL 100*  CO2 24  GLUCOSE 327*  BUN 9  CALCIUM 8.7*  CREATININE 0.64  GFRNONAA >60  GFRAA >60    LIVER FUNCTION TESTS:  Recent Labs  04/01/16 1210  BILITOT 1.2  AST 22  ALT 16  ALKPHOS 60  PROT 7.1  ALBUMIN 3.8    TUMOR MARKERS: No results for input(s): AFPTM, CEA, CA199, CHROMGRNA in the last 8760 hours.  Assessment and Plan: 65y.o. female with past medical history of prior tobacco use, diabetes, hypertension, GERD, osteoarthritis, asthma who was admitted to the hospital on 8/13 with confusion and falls at home in addition to mild left sided weakness/numbness. Subsequent imaging studies have now revealed multiple hyperdense brain metastasis, 3.7 cm left upper lobe lung mass, bilateral renal masses, right paraspinal and left psoas lesions. Patient has no documented history of cancer. Request now received from primary service for CT-guided biopsy  of most accessible site. Imaging studies were reviewed by Dr. Vernard Gambles and he feels most accessible site would be the left psoas lesion. Details/risks of procedure, including but not limited to, internal bleeding, infection, injury to adjacent structures, discussed with patient and family with their understanding and consent. Procedure tent scheduled for 8/15.    Thank you for this interesting consult.  I greatly enjoyed meeting NEERA TENG and look forward to participating in their care.  A copy of this report was sent to the requesting provider on this date.  Electronically Signed: D. Rowe Robert 04/02/2016, 3:21 PM   I spent a total of 30 minutes  in face to face in clinical consultation, greater than 50% of which was counseling/coordinating care for CT guided left psoas lesion biopsy

## 2016-04-02 NOTE — Progress Notes (Signed)
Chaplain responding to referral by RN: Pt wishes to complete HCPOA.    Provided education around Advance Directives at bedside.  Pt, sister and daughter voiced understanding.  Pt completed HCPOA and Living Will  Chaplain notarized.   Original and three copies with pt and family, copy placed in chart on unit, copy given to medical records to scan into EPIC.      [place], [place]

## 2016-04-02 NOTE — Evaluation (Signed)
Physical Therapy Evaluation Patient Details Name: [name] MRN: [id number] DOB: 07-30-1951 Today's Date: 04/02/2016   History of Present Illness  [name] is a 65 y.o. female with medical history significant for type 2 diabetes mellitus, hypertension, GERD, and osteoarthritis who presents the emergency department with one month of intermittent confusion and multiple falls over the last 24 hours.   CT of the head without contrast was obtained and notable for multiple brain hemorrhages and a pattern suggestive of metastatic disease. CT of the abdomen and pelvis was obtained, looking for a source of metastases and a 3.7 cm mass was identified in the medial left upper lobe with adjacent mediastinal invasion suspicious for primary bronchogenic carcinoma. New ill-defined low attenuation masses were also noted in the bilateral kidneys, possibly metastases  Clinical Impression  Pt admitted with above diagnosis. Pt currently with functional limitations due to the deficits listed below (see PT Problem List).  Pt will benefit from skilled PT to increase their independence and safety with mobility to allow discharge to the venue listed below.  Pt presents with L sided weakness and balance deficits with hx of falls just prior to admission.  Pt would benefit from post acute rehab prior to d/c home.     Follow Up Recommendations CIR;Supervision/Assistance - 24 hour    Equipment Recommendations  Rolling walker with 5" wheels    Recommendations for Other Services       Precautions / Restrictions Precautions Precautions: Fall Restrictions Weight Bearing Restrictions: No      Mobility  Bed Mobility Overal bed mobility: Needs Assistance Bed Mobility: Rolling;Sidelying to Sit Rolling: Min guard Sidelying to sit: Min assist       General bed mobility comments: pt close to finishing up with PT when I arrived  Transfers Overall transfer level: Needs assistance Equipment used:  None Transfers: Sit to/from Stand Sit to Stand: Min assist         General transfer comment: verbal cues for hand placement, assist for steadying upon rise  Ambulation/Gait Ambulation/Gait assistance: Min assist;+2 safety/equipment Ambulation Distance (Feet): 120 Feet Assistive device: Rolling walker (2 wheeled) Gait Pattern/deviations: Step-to pattern;Decreased stance time - left     General Gait Details: pt with step to pattern leading with L LE, improved gait with RW ambulating 60 feet, removed RW and pt ambulated back to room without AD and requiring min assist for occasional steadying, L LE also appeared to fatigue quickly  Stairs            Wheelchair Mobility    Modified Rankin (Stroke Patients Only)       Balance Overall balance assessment: Needs assistance;History of Falls Sitting-balance support: No upper extremity supported;Feet supported Sitting balance-Leahy Scale: Good     Standing balance support: Bilateral upper extremity supported;During functional activity Standing balance-Leahy Scale: Fair Standing balance comment: dynamically does better with RW than without                             Pertinent Vitals/Pain Pain Assessment: No/denies pain    Home Living Family/patient expects to be discharged to:: Inpatient rehab Living Arrangements: Children Available Help at Discharge: Family;Available 24 hours/day Type of Home: House         Home Equipment: Grab bars - tub/shower;Grab bars - toilet (also reports that she has access to a tub seat with back)      Prior Function Level of Independence: Independent  Hand Dominance   Dominant Hand: Right (but does somethings left (ie: tying shoes))    Extremity/Trunk Assessment   Upper Extremity Assessment: LUE deficits/detail       LUE Deficits / Details: mild lag of all movements with compared simultaneously to RUE; 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 thus affecting her grip pta  for current symptoms   Lower Extremity Assessment: LLE deficits/detail;Generalized weakness   LLE Deficits / Details: L hip grossly 3+/5 throughout     Communication   Communication: No difficulties  Cognition Arousal/Alertness: Awake/alert Behavior During Therapy: WFL for tasks assessed/performed Overall Cognitive Status: Impaired/Different from baseline Area of Impairment: Memory;Safety/judgement     Memory: Decreased short-term memory   Safety/Judgement: Decreased awareness of safety     General Comments: I asked her if she should be getting up by herself and she said, "they have not told me yet". I reminded her that she fell since admission so did she think she should be getting up by herself and she, "probably not a good idea". When asked she says she does not feel like she is safe to drive (and I agree). We also discussed that cooking in the kitchen (stove and stove top) is not safe either with memory and decreased strength in LUE. Sister in room and I made her and pt aware that we (therapy) did not feel that pt would be safe without 24/7 S    General Comments      Exercises Other Exercises Other Exercises: taped up a washcloth into a ball and had her work on transferring if from one hand to other with shoudlers at 90 degrees--the closer she got to 10 reps the lower her left arm dropped--I brought this to her attention once at about the 5th rep at which point she raised it back up but then it started to drop back down again. She also practiced using an open washcloth like pizza dough and tossed it from one hand to the other (said her dad taught her how to do this when she was little)      Assessment/Plan    PT Assessment Patient needs continued PT services  PT Diagnosis Abnormality of gait   PT Problem List Decreased strength;Decreased balance;Decreased mobility;Decreased safety awareness;Decreased knowledge of use of DME  PT Treatment Interventions DME instruction;Gait  training;Therapeutic exercise;Balance training;Neuromuscular re-education;Functional mobility training;Therapeutic activities;Patient/family education   PT Goals (Current goals can be found in the Care Plan section) Acute Rehab PT Goals Patient Stated Goal: to get this figured out so I can know what my treatment options are PT Goal Formulation: With patient Time For Goal Achievement: 04/16/16 Potential to Achieve Goals: Good    Frequency Min 3X/week   Barriers to discharge        Co-evaluation               End of Session Equipment Utilized During Treatment: Gait belt Activity Tolerance: Patient tolerated treatment well Patient left: in chair;Other (comment) (with OT) Nurse Communication: Mobility status         Time: 1347-1410 PT Time Calculation (min) (ACUTE ONLY): 23 min   Charges:   PT Evaluation $PT Eval Moderate Complexity: 1 Procedure     PT G Codes:        Shalia Bartko,KATHrine E 04/02/2016, 3:55 PM Carmelia Bake, PT, DPT 04/02/2016 Pager: [phone]

## 2016-04-02 NOTE — Progress Notes (Signed)
CSW consulted for Advanced Directives. [facility] has assisted with this request.  CSW signing off.  Werner Lean LCSW [phone]

## 2016-04-02 NOTE — Progress Notes (Signed)
Rehab Admissions Coordinator Note:  Patient was screened by Cleatrice Burke for appropriateness for an Inpatient Acute Rehab Consult per OT recommendation.  At this time, we are recommending await medical workup before deternining rehab venue options.Cleatrice Burke 04/02/2016, 3:21 PM  I can be reached at [phone].

## 2016-04-02 NOTE — Evaluation (Signed)
Speech Language Pathology Evaluation Patient Details Name: [name] MRN: [id number] DOB: 1951-05-05 Today's Date: 04/02/2016 Time: 1130-1205 SLP Time Calculation (min) (ACUTE ONLY): 35 min  Problem List:  Patient Active Problem List   Diagnosis Date Noted  . Metastatic lung cancer (metastasis from lung to other site) ([place]) 04/01/2016  . Brain metastases ([place]) 04/01/2016  . Bilateral renal masses 04/01/2016  . Falls 04/01/2016  . Confusion 04/01/2016  . Leukocytosis 04/01/2016  . Cerebral brain hemorrhage ([place]) 04/01/2016  . Metastatic primary lung cancer ([place]) 04/01/2016  . Hypertension 06/23/2012  . Snoring 04/12/2012  . Chest pain 04/10/2012  . Obesity 04/10/2012  . Hyperlipidemia 03/02/2011  . Diabetes mellitus type II, non insulin dependent ([place]) 08/01/2010  . ASTHMA UNSPECIFIED WITH EXACERBATION 01/24/2010  . PALPITATIONS 01/24/2010  . ARRHYTHMIA, HX OF 01/24/2010  . HEMORRHOIDS 06/21/2009  . RECTAL BLEEDING 06/21/2009  . Paroxysmal supraventricular tachycardia ([place]) 07/27/2008  . Asthma, mild persistent 07/27/2008  . GERD 07/27/2008   Past Medical History:  Past Medical History:  Diagnosis Date  . Anxiety   . Asthma   . Bronchitis   . Depression   . Essential hypertension   . GERD (gastroesophageal reflux disease)   . Hyperlipidemia   . Lichen sclerosus   . Osteoarthritis   . SVT (supraventricular tachycardia) ([facility])    No inducible arrhythmias by EP study 11/11  . Type 2 diabetes mellitus ([place])    Past Surgical History:  Past Surgical History:  Procedure Laterality Date  . CESAREAN SECTION WITH BILATERAL TUBAL LIGATION  1978  . CHOLECYSTECTOMY  2007  . EP study  11/11   HPI:  [name] a 65 y.o.femalewith medical history significant for type 2 diabetes mellitus, hypertension, GERD, and osteoarthritis who presents the emergency department with one month of intermittent confusion and multiple falls over the last 24 hours. Patient had  reportedly been in her usual state of health until approximately one month ago when her husband began to notice some intermittent confusion. Patient acknowledges this as well. Over the last 24 hours, the patient has had multiple falls, apparently losing her coordination when she bends over or turns quickly. There was no significant injuries reported with these falls. Patient denies any headaches or change in vision or hearing, but acknowledges some mild weakness and numbness in the left upper extremity which has developed insidiously over the past several days. She endorses chronic dyspnea and cough attributed to her asthma, but denies any significant recent worsening. She denies recent fevers, chills, chest pain, palpitations, abdominal pain, or diarrhea. She has been nauseous and vomited once today. She denies a personal history of smoking, but has significant secondhand smoke exposure from her husband. She endorses a family history of lung cancer.   Assessment / Plan / Recommendation Clinical Impression  Cognitive/linguistic evaluation was completed using the MOCA-B and informal assessment.  The patient and her family reported intermittent confusion and memory issues for about the last month.  The patient achieved a score of 26/30.  Deficits were noted for delayed recall and calculations.   The patient was able to solve basic problems but her judgement is questionable.  She felt that it was safe to move about the room without assistance despite a history of recurrent falls at home.  She has also had issues with forgetting about food on the stove at home which is most likely impacted by her short term memory deficits.  Recommend follow up ST to address these deficits.  A short  stay in inpatient rehab may be beneficial with follow up [place] for home safety assessment and to assist with setting up compensatory devices to facilitate recall.  ST will not follow while in acute.  Thank you for the consult.  Please  reconsult if we can be of further assistance.      SLP Assessment    Mild cognitive deficits   Follow Up Recommendations  Home health SLP vs Inpatient Rehab          SLP Evaluation Prior Functioning  Cognitive/Linguistic Baseline: Baseline deficits for about the last month.   Baseline deficit details: Memory issues and intermittent confusion x1 month.   Type of Home: House  Lives With: Spouse Available Help at Discharge: Family   Cognition  Overall Cognitive Status: Impaired/Different from baseline Arousal/Alertness: Awake/alert Orientation Level: Oriented X4 Attention: Focused Focused Attention: Appears intact Memory: Impaired Memory Impairment: Decreased recall of new information (5/5 immediate 3/5 delayed improves with semantic cue) Awareness: Appears intact Problem Solving: Appears intact Executive Function: Sequencing Sequencing: Appears intact Safety/Judgment: Impaired Comments: At times the patient forgets that she is supposed to have assitance to get up.      Comprehension  Auditory Comprehension Overall Auditory Comprehension: Appears within functional limits for tasks assessed Yes/No Questions: Within Functional Limits Commands: Within Functional Limits Conversation: Simple    Expression Expression Primary Mode of Expression: Verbal Verbal Expression Overall Verbal Expression: Appears within functional limits for tasks assessed Initiation: No impairment Automatic Speech: Name;Social Response Level of Generative/Spontaneous Verbalization: Conversation Naming: No impairment Pragmatics: No impairment Non-Verbal Means of Communication: Not applicable Written Expression Written Expression: Not tested   Oral / Pharmacist, community Speech Overall Motor Speech: Appears within functional limits for tasks assessed Respiration: Within functional limits Phonation: Normal Resonance: Within functional limits Articulation: Within functional limitis Intelligibility:  Intelligible Motor Planning: Witnin functional limits Motor Speech Errors: Not applicable   GO                   Shelly Flatten, MA, CCC-SLP Acute Rehab SLP [phone] Lamar Sprinkles 04/02/2016, 2:26 PM

## 2016-04-02 NOTE — Progress Notes (Signed)
Inpatient Diabetes Program Recommendations  AACE/ADA: New Consensus Statement on Inpatient Glycemic Control (2015)  Target Ranges:  Prepandial:   less than 140 mg/dL      Peak postprandial:   less than 180 mg/dL (1-2 hours)      Critically ill patients:  140 - 180 mg/dL   Results for CHARNITA, TRUDEL (MRN [id number]) as of 04/02/2016 12:47  Ref. Range 04/01/2016 22:56 04/02/2016 03:49 04/02/2016 08:35 04/02/2016 12:13  Glucose-Capillary Latest Ref Range: 65 - 99 mg/dL 275 (H) 209 (H) 195 (H) 279 (H)    Admit Metastatic Brain Tumor.   History: DM2  Home DM Meds: Glipizide 10 mg bid        Metformin 850 mg bid  Current Insulin Orders: Novolog Moderate Correction Scale/ SSI (0-15 units) Q4 hours      -Note patient has PO diet orders.  -Glucose levels elevated since admission >200 mg/dl.    MD- Please consider the following in-hospital insulin adjustments while patient's home oral DM meds are on hold:  1. Change Novolog Moderate SSI to TID AC + HS (currently ordered Q4 hours)  2. Start Lantus 16 units daily (0.2 units/kg dosing)      --Will follow patient during hospitalization--  Wyn Quaker RN, MSN, CDE Diabetes Coordinator Inpatient Glycemic Control Team Team Pager: [phone] (8a-5p)

## 2016-04-02 NOTE — Progress Notes (Signed)
Progress Note    [name]  EHU:[id number] DOB: 16-May-1951  DOA: 04/01/2016 PCP: Robert Bellow, MD    Brief Narrative:   [name] is an 65 y.o. female with medical history significant for type 2 diabetes mellitus, hypertension, GERD, and osteoarthritis who was admitted 04/01/16 with one month of intermittent confusion and multiple falls over the last 24 hours. CT of the head without contrast was obtained and notable for multiple brain hemorrhages and a pattern suggestive of metastatic disease. CT of the abdomen and pelvis was obtained, looking for a source of metastases and a 3.7 cm mass was identified in the medial left upper lobe with adjacent mediastinal invasion suspicious for primary bronchogenic carcinoma. New ill-defined low attenuation masses were also noted in the bilateral kidneys, possibly metastases, and MRI of the abdomen showed bilateral kidney lesions as well as a 2 cm upper pole left renal lesion, right paraspinous muscular enhancing lesion and a left high psoas nodule consistent with metastasis.  Assessment/Plan:   Principal Problem:   Metastatic lung cancer (metastasis from lung to other site) [facility]) Complicated by cerebral brain hemorrhage resulting in confusion, lower extremity weakness and falls/bilateral renal masses 3.7 cm mass noted in medial LUL on CT with invasion into mediastinum. Multiple brain hemorrhages noted on CT, largest of which is 2.8 x 2.5 x 2.6 cm at the genu of Rt internal capsule with mass effect on the third ventricle and midline shift of 7 mm.  Evaluated by neurosurgeon. Consulted interventional radiology for CT-guided biopsy of most accessible site. Plan is for biopsy of so as muscle metastasis 04/03/16. This is likely a primary bronchogenic carcinoma. Further treatment recommendations pending pathology.  Active Problems:   Asthma, mild persistent Continue Dulera and when necessary bronchodilators. Stable.    GERD Continue PPI.    Diabetes mellitus type II, non insulin dependent (HCC) Currently being managed with moderate scale SSI every 4 hours.    Hypertension Continue lisinopril, metoprolol.    Leukocytosis Mild, monitor for signs of infection.    Hyperlipidemia Continue Zocor and fish oil supplements.  Family Communication/Anticipated D/C date and plan/Code Status   DVT prophylaxis: SCDs ordered. Code Status: Full Code.  Family Communication: Multiple family including husband and daughters at the bedside. Disposition Plan: Likely will need SNF given lower extremity weakness, but CIR might be possible.   Medical Consultants:    Interventional Radiology   Procedures:    None  Anti-Infectives:   None  Subjective:    ELLOISE Jordan reports lower extremity weakness and recent falls at home. Current complaints of dyspnea, cough, or significant pain. No nausea or vomiting. Although she's lost some weight, it has not been a significant amount and it was in part intentional.  Objective:    Vitals:   04/02/16 0700 04/02/16 0715 04/02/16 0838 04/02/16 0900  BP:  (!) 126/57  133/69  Pulse:  70  74  Resp: '17 20  20  '$ Temp:   98.8 F (37.1 C) 98.8 F (37.1 C)  TempSrc:   Oral   SpO2:  96%  98%  Weight:      Height:        Intake/Output Summary (Last 24 hours) at 04/02/16 0953 Last data filed at 04/02/16 0630  Gross per 24 hour  Intake                0 ml  Output  400 ml  Net             -400 ml   Filed Weights   04/01/16 1137 04/01/16 2127  Weight: 80.3 kg (177 lb) 81.2 kg (179 lb 0.2 oz)    Exam: General exam: Appears calm and comfortable.  Respiratory system: Clear to auscultation. Respiratory effort normal. Cardiovascular system: S1 & S2 heard, RRR. No JVD,  rubs, gallops or clicks. No murmurs. Gastrointestinal system: Abdomen is nondistended, soft and nontender. No organomegaly or masses felt. Normal bowel sounds heard. Central nervous system: Alert and oriented.  No focal neurological deficits. Extremities: No clubbing,  or cyanosis. No edema. Skin: No rashes, lesions or ulcers. Psychiatry: Judgement and insight appear normal. Mood & affect appropriate.   Data Reviewed:   I have personally reviewed following labs and imaging studies:  Labs: Basic Metabolic Panel:  Recent Labs Lab 04/01/16 1210  NA 134*  K 4.1  CL 100*  CO2 24  GLUCOSE 327*  BUN 9  CREATININE 0.64  CALCIUM 8.7*   GFR Estimated Creatinine Clearance: 71 mL/min (by C-G formula based on SCr of 0.8 mg/dL). Liver Function Tests:  Recent Labs Lab 04/01/16 1210  AST 22  ALT 16  ALKPHOS 60  BILITOT 1.2  PROT 7.1  ALBUMIN 3.8   Coagulation profile  Recent Labs Lab 04/01/16 1256  INR 1.05    CBC:  Recent Labs Lab 04/01/16 1210  WBC 13.0*  HGB 11.9*  HCT 36.0  MCV 87.0  PLT 323   Cardiac Enzymes:  Recent Labs Lab 04/01/16 1256  TROPONINI <0.03   CBG:  Recent Labs Lab 04/01/16 1421 04/01/16 2022 04/01/16 2256 04/02/16 0349 04/02/16 0835  GLUCAP 251* 252* 275* 209* 195*    Microbiology Recent Results (from the past 240 hour(s))  MRSA PCR Screening     Status: None   Collection Time: 04/01/16 10:40 PM  Result Value Ref Range Status   MRSA by PCR NEGATIVE NEGATIVE Final    Comment:        The GeneXpert MRSA Assay (FDA approved for NASAL specimens only), is one component of a comprehensive MRSA colonization surveillance program. It is not intended to diagnose MRSA infection nor to guide or monitor treatment for MRSA infections.     Radiology: Dg Chest 2 View  Result Date: 04/01/2016 CLINICAL DATA:  Chest pain and dizziness. Several falls in last 24 hours. EXAM: CHEST  2 VIEW COMPARISON:  08/26/2015 FINDINGS: The heart size and mediastinal contours are within normal limits. Both lungs are clear. The visualized skeletal structures are unremarkable. IMPRESSION: No active cardiopulmonary disease. Electronically Signed   By: Earle Gell M.D.   On: 04/01/2016 13:38   Ct Head Wo Contrast  Addendum Date: 04/02/2016   ADDENDUM REPORT: 04/02/2016 07:37 ADDENDUM: Study discussed by telephone with Dr. Kristeen Miss on 04/02/2016 at 0720 hours. We discussed that the multiple (at least 6) hyperdense lesions scattered in the brain are very round and discretely marginated. These are most compatible with brain masses. Their hyperdensity could beyond the basis of hypercellularity, or possibly some internal hemorrhage. However, there is no bona fide acute intracranial hemorrhage. There is edema surrounding the largest lesion measuring 28 mm which is centered at the right deep gray matter, and mild associated regional mass effect. CONCLUSION 1. Multiple hyperdense brain metastases. See also CT Chest Abdomen and Pelvis from the same day. 2. No acute intracranial hemorrhage. Electronically Signed   By: Genevie Ann M.D.   On: 04/02/2016  07:37   Result Date: 04/02/2016 CLINICAL DATA:  Reported pt with multiple falls in last 24 hours and started calling people in middle of night. "feels like screaming" and wants people to do want she wants. Hx dm. EXAM: CT HEAD WITHOUT CONTRAST TECHNIQUE: Contiguous axial images were obtained from the base of the skull through the vertex without intravenous contrast. COMPARISON:  None. FINDINGS: There are multiple brain hemorrhages. In the right base a ganglia centered in the region of the genu of the right internal capsule, there is a large round hemorrhage measuring 2.8 x 2.5 x 2.6 cm. This has mass effect mostly facing the third ventricle and shifting the midline structures to the left by 7 mm. There is surrounding vasogenic edema. There are multiple small hemorrhages along the corticomedullary junctions. There is a hemorrhage in the anterior left temporal lobe another smaller hemorrhage in the posterior left occipital lobe another in the right frontal lobe, 1 in the right cerebellum and a smaller along the peripheral left  cerebellum. Another area of hemorrhage lies along the inferior margin of the temporal horn of the right lateral ventricle in the right temporal lobe. There is mild prominence of the lateral ventricles, most notably the atria and temporal horns. This suggests a mild degree of obstructive hydrocephalus. There is mild patchy white matter hypoattenuation consistent with chronic microvascular ischemic change. There is no evidence of an ischemic infarct. There are no extra-axial masses or abnormal fluid collections. No skull lesion. The visualized sinuses and mastoid air cells are clear. IMPRESSION: 1. Multiple brain hemorrhages. Given the pattern, hemorrhagic metastatic disease is suspected. Alternatively, the large right base a ganglia hemorrhage may be a hypertensive hemorrhage. Other corticomedullary junction hemorrhages could potentially be traumatic in origin. 2. Recommend nonemergent follow-up chest, abdomen and pelvis CT to assess for neoplastic disease. Electronically Signed: By: Lajean Manes M.D. On: 04/01/2016 14:08   Ct Chest W Contrast  Result Date: 04/01/2016 CLINICAL DATA:  Multiple falls. Hemorrhagic brain metastases seen on recent CT. Diabetes. EXAM: CT CHEST, ABDOMEN, AND PELVIS WITH CONTRAST TECHNIQUE: Multidetector CT imaging of the chest, abdomen and pelvis was performed following the standard protocol during bolus administration of intravenous contrast. CONTRAST:  187m ISOVUE-300 IOPAMIDOL (ISOVUE-300) INJECTION 61% COMPARISON:  AP CT on 07/25/2005 FINDINGS: CT CHEST FINDINGS Cardiovascular: Normal heart size. Mild LAD coronary artery calcification seen. Aortic atherosclerosis noted. Mediastinum/Lymph Nodes: No pathologically enlarged lymph nodes identified. Lungs/Pleura: An irregular pulmonary masses seen in the medial left upper lobe which shows invasion of adjacent mediastinal fat. This measures 3.7 x 2.9 cm on image 13/2. This is suspicious for primary bronchogenic carcinoma. 5 mm  perifissural nodule in the right lung along the minor fissure on image 72/series 7 is most consistent with a benign intrapulmonary lymph node. No evidence of pleural effusion. Musculoskeletal: No chest wall mass or suspicious bone lesions identified. CT ABDOMEN PELVIS FINDINGS Hepatobiliary: Moderate hepatic steatosis is seen but decreased since previous study. No liver masses identified. Prior cholecystectomy noted. No evidence of biliary dilatation. Pancreas: No mass, inflammatory changes, or other significant abnormality. Spleen: Within normal limits in size and appearance. Adrenals/Urinary Tract: Normal adrenal glands. 2.5 cm low-attenuation lesion is seen in the upper pole left kidney on image 20/8, and low-attenuation lesion measuring 2.2 cm is seen in the upper pole of the right kidney on image 23/8. These are new since previous study, are ill-defined, and do not meet criteria for benign cysts. Differential diagnosis includes renal metastases, renal cell carcinomas, and renal  abscesses. No evidence of hydronephrosis. Unopacified urinary bladder is unremarkable in appearance. Stomach/Bowel: No evidence of obstruction, inflammatory process, or abnormal fluid collections. Vascular/Lymphatic: No pathologically enlarged lymph nodes. No evidence of abdominal aortic aneurysm. Aortic atherosclerosis noted. Reproductive: No mass or other significant abnormality. Other: None. Musculoskeletal:  No suspicious bone lesions identified. IMPRESSION: 3.7 cm mass in medial left upper lobe with adjacent mediastinal invasion. This is highly suspicious for primary bronchogenic carcinoma. No evidence of thoracic lymphadenopathy or pleural effusion. New ill-defined low-attenuation masses in both kidneys measuring 2-3 cm. Differential diagnosis includes renal metastases, primary renal cell carcinomas, and renal abscesses. Consider abdomen MRI without and with contrast for further characterization. No other sites of metastatic  disease identified within the abdomen or pelvis. Decreased hepatic steatosis since prior study. Aortic atherosclerosis and coronary artery calcification. Electronically Signed   By: Earle Gell M.D.   On: 04/01/2016 17:19   Mr Abdomen W Wo Contrast  Result Date: 04/02/2016 CLINICAL DATA:  Renal lesions on CT. Hemorrhagic brain metastasis. Multiple falls. EXAM: MRI ABDOMEN WITHOUT AND WITH CONTRAST TECHNIQUE: Multiplanar multisequence MR imaging of the abdomen was performed both before and after the administration of intravenous contrast. CONTRAST:  48m MULTIHANCE GADOBENATE DIMEGLUMINE 529 MG/ML IV SOLN COMPARISON:  Abdominal CT of 04/01/2016 FINDINGS: Mild to moderate motion degradation throughout. Lower chest: Normal heart size without pericardial or pleural effusion. Hepatobiliary: Moderate hepatic steatosis. No focal liver lesion. Cholecystectomy, without biliary ductal dilatation. Pancreas: Normal, without mass or ductal dilatation. Spleen: Normal in size, without focal abnormality. Adrenals/Urinary Tract: Normal adrenal glands. Too small to characterize lesions in both kidneys. 1.3 cm upper pole right renal lesion is T2 hypo intense and demonstrates mild post-contrast enhancement, including on image 56/ series 1003. A 2.0 cm upper pole left renal lesion is also T2 hypointense and demonstrates mild post-contrast enhancement, including on image 46/ series 1005. No hydronephrosis. Stomach/Bowel: Normal stomach and abdominal bowel loops. Vascular/Lymphatic: Normal caliber of the aorta and branch vessels. Patent renal veins. No retroperitoneal or retrocrural adenopathy. Other: No ascites. Musculoskeletal: Right paraspinous muscular enhancing lesion measures 1.3 cm on image 15/series 4. A left high psoas enhancing nodule measures 1.8 cm on image 51/ series 1002. IMPRESSION: 1. Enhancing intramuscular lesions are most consistent with metastasis, presumably from the patient's primary lung cancer. 2. Bilateral  hypoenhancing renal lesions with precontrast T2 hypointensity. Favor metastasis. Differential considerations include multiple papillary type renal cell carcinomas versus less likely lipid poor angiomyolipomas. 3. Motion degraded exam. 4. Hepatic steatosis. Electronically Signed   By: KAbigail MiyamotoM.D.   On: 04/02/2016 08:47   Ct Abdomen Pelvis W Contrast  Result Date: 04/01/2016 CLINICAL DATA:  Multiple falls. Hemorrhagic brain metastases seen on recent CT. Diabetes. EXAM: CT CHEST, ABDOMEN, AND PELVIS WITH CONTRAST TECHNIQUE: Multidetector CT imaging of the chest, abdomen and pelvis was performed following the standard protocol during bolus administration of intravenous contrast. CONTRAST:  1021mISOVUE-300 IOPAMIDOL (ISOVUE-300) INJECTION 61% COMPARISON:  AP CT on 07/25/2005 FINDINGS: CT CHEST FINDINGS Cardiovascular: Normal heart size. Mild LAD coronary artery calcification seen. Aortic atherosclerosis noted. Mediastinum/Lymph Nodes: No pathologically enlarged lymph nodes identified. Lungs/Pleura: An irregular pulmonary masses seen in the medial left upper lobe which shows invasion of adjacent mediastinal fat. This measures 3.7 x 2.9 cm on image 13/2. This is suspicious for primary bronchogenic carcinoma. 5 mm perifissural nodule in the right lung along the minor fissure on image 72/series 7 is most consistent with a benign intrapulmonary lymph node. No evidence of pleural effusion.  Musculoskeletal: No chest wall mass or suspicious bone lesions identified. CT ABDOMEN PELVIS FINDINGS Hepatobiliary: Moderate hepatic steatosis is seen but decreased since previous study. No liver masses identified. Prior cholecystectomy noted. No evidence of biliary dilatation. Pancreas: No mass, inflammatory changes, or other significant abnormality. Spleen: Within normal limits in size and appearance. Adrenals/Urinary Tract: Normal adrenal glands. 2.5 cm low-attenuation lesion is seen in the upper pole left kidney on image  20/8, and low-attenuation lesion measuring 2.2 cm is seen in the upper pole of the right kidney on image 23/8. These are new since previous study, are ill-defined, and do not meet criteria for benign cysts. Differential diagnosis includes renal metastases, renal cell carcinomas, and renal abscesses. No evidence of hydronephrosis. Unopacified urinary bladder is unremarkable in appearance. Stomach/Bowel: No evidence of obstruction, inflammatory process, or abnormal fluid collections. Vascular/Lymphatic: No pathologically enlarged lymph nodes. No evidence of abdominal aortic aneurysm. Aortic atherosclerosis noted. Reproductive: No mass or other significant abnormality. Other: None. Musculoskeletal:  No suspicious bone lesions identified. IMPRESSION: 3.7 cm mass in medial left upper lobe with adjacent mediastinal invasion. This is highly suspicious for primary bronchogenic carcinoma. No evidence of thoracic lymphadenopathy or pleural effusion. New ill-defined low-attenuation masses in both kidneys measuring 2-3 cm. Differential diagnosis includes renal metastases, primary renal cell carcinomas, and renal abscesses. Consider abdomen MRI without and with contrast for further characterization. No other sites of metastatic disease identified within the abdomen or pelvis. Decreased hepatic steatosis since prior study. Aortic atherosclerosis and coronary artery calcification. Electronically Signed   By: Earle Gell M.D.   On: 04/01/2016 17:19    Medications:   . calcium-vitamin D  0.5 tablet Oral Q breakfast  . insulin aspart  0-15 Units Subcutaneous Q4H  . lisinopril  5 mg Oral Daily  . metoprolol succinate  25 mg Oral Daily  . mometasone-formoterol  2 puff Inhalation BID  . multivitamin with minerals  1 tablet Oral Daily  . omega-3 acid ethyl esters  1 g Oral Daily  . pantoprazole  40 mg Oral Daily  . simvastatin  20 mg Oral QPM  . tacrolimus  1 application Topical BID   Continuous Infusions:   Time spent:  35 minutes with > 50% of time discussing current diagnostic test results, clinical impression and plan of care.   LOS: 1 day   Niles Ess  [facility] Pager [phone]. If unable to reach me by pager, please call my cell phone at [phone].  *Please refer to [email or web address], password [email or web address] to get updated schedule on who will round on this patient, as hospitalists switch teams weekly. If 7PM-7AM, please contact night-coverage at [email or web address], password [email or web address] for any overnight needs.  04/02/2016, 9:53 AM

## 2016-04-03 ENCOUNTER — Ambulatory Visit
Admit: 2016-04-03 | Discharge: 2016-04-03 | Disposition: A | Discharge status: Home / Self Care | Payer: PPO | Attending: Radiation Oncology | Admitting: Radiation Oncology

## 2016-04-03 ENCOUNTER — Inpatient Hospital Stay (HOSPITAL_COMMUNITY): Payer: PPO

## 2016-04-03 ENCOUNTER — Ambulatory Visit (HOSPITAL_COMMUNITY)
Admit: 2016-04-03 | Discharge: 2016-04-03 | Disposition: A | Discharge status: Home / Self Care | Payer: PPO | Attending: Radiation Oncology | Admitting: Radiation Oncology

## 2016-04-03 DIAGNOSIS — Z510 Encounter for antineoplastic radiation therapy: Secondary | ICD-10-CM | POA: Insufficient documentation

## 2016-04-03 DIAGNOSIS — C3492 Malignant neoplasm of unspecified part of left bronchus or lung: Secondary | ICD-10-CM

## 2016-04-03 DIAGNOSIS — C7931 Secondary malignant neoplasm of brain: Secondary | ICD-10-CM | POA: Insufficient documentation

## 2016-04-03 DIAGNOSIS — C3491 Malignant neoplasm of unspecified part of right bronchus or lung: Secondary | ICD-10-CM

## 2016-04-03 DIAGNOSIS — C3490 Malignant neoplasm of unspecified part of unspecified bronchus or lung: Secondary | ICD-10-CM | POA: Insufficient documentation

## 2016-04-03 LAB — GLUCOSE, CAPILLARY
GLUCOSE-CAPILLARY: 204 mg/dL — AB (ref 65–99)
GLUCOSE-CAPILLARY: 162 mg/dL — AB (ref 65–99)
GLUCOSE-CAPILLARY: 197 mg/dL — AB (ref 65–99)
Glucose-Capillary: 184 mg/dL — ABNORMAL HIGH (ref 65–99)
Glucose-Capillary: 234 mg/dL — ABNORMAL HIGH (ref 65–99)
Glucose-Capillary: 183 mg/dL — ABNORMAL HIGH (ref 65–99)
Glucose-Capillary: 158 mg/dL — ABNORMAL HIGH (ref 65–99)

## 2016-04-03 LAB — CBC WITH DIFFERENTIAL/PLATELET
BASOS ABS: 0 10*3/uL (ref 0.0–0.1)
Basophils Relative: 0 %
Eosinophils Absolute: 0.5 10*3/uL (ref 0.0–0.7)
Eosinophils Relative: 5 %
HEMATOCRIT: 28.8 % — AB (ref 36.0–46.0)
HEMOGLOBIN: 9.5 g/dL — AB (ref 12.0–15.0)
LYMPHS PCT: 16 %
Lymphs Abs: 1.5 10*3/uL (ref 0.7–4.0)
MCH: 28.8 pg (ref 26.0–34.0)
MCHC: 33 g/dL (ref 30.0–36.0)
MCV: 87.3 fL (ref 78.0–100.0)
MONO ABS: 0.8 10*3/uL (ref 0.1–1.0)
MONOS PCT: 9 %
NEUTROS ABS: 6.6 10*3/uL (ref 1.7–7.7)
NEUTROS PCT: 70 %
Platelets: 296 10*3/uL (ref 150–400)
RBC: 3.3 MIL/uL — ABNORMAL LOW (ref 3.87–5.11)
RDW: 13.3 % (ref 11.5–15.5)
WBC: 9.4 10*3/uL (ref 4.0–10.5)

## 2016-04-03 MED ORDER — LIP MEDEX EX OINT
TOPICAL_OINTMENT | CUTANEOUS | Status: AC
Start: 2016-04-03 — End: 2016-04-03
  Administered 2016-04-03: 10:00:00
  Filled 2016-04-03: qty 7

## 2016-04-03 MED ORDER — MIDAZOLAM HCL 2 MG/2ML IJ SOLN
INTRAMUSCULAR | Status: AC | PRN
  Administered 2016-04-03 (×2): 1 mg via INTRAVENOUS

## 2016-04-03 MED ORDER — INSULIN GLARGINE 100 UNIT/ML ~~LOC~~ SOLN
10.0000 [IU] | Freq: Every day | SUBCUTANEOUS | Status: DC
  Administered 2016-04-03 – 2016-04-05 (×3): 10 [IU] via SUBCUTANEOUS
  Filled 2016-04-03 (×4): qty 0.1

## 2016-04-03 MED ORDER — ONDANSETRON HCL 4 MG/2ML IJ SOLN
4.0000 mg | Freq: Four times a day (QID) | INTRAMUSCULAR | Status: DC | PRN
  Administered 2016-04-05: 4 mg via INTRAVENOUS
  Filled 2016-04-03 (×2): qty 2

## 2016-04-03 MED ORDER — MIDAZOLAM HCL 2 MG/2ML IJ SOLN
INTRAMUSCULAR | Status: AC
  Filled 2016-04-03: qty 6

## 2016-04-03 MED ORDER — GADOBENATE DIMEGLUMINE 529 MG/ML IV SOLN
20.0000 mL | Freq: Once | INTRAVENOUS | Status: AC
  Administered 2016-04-03: 17 mL via INTRAVENOUS

## 2016-04-03 MED ORDER — FENTANYL CITRATE (PF) 100 MCG/2ML IJ SOLN
INTRAMUSCULAR | Status: AC | PRN
  Administered 2016-04-03 (×2): 25 ug via INTRAVENOUS

## 2016-04-03 MED ORDER — FENTANYL CITRATE (PF) 100 MCG/2ML IJ SOLN
INTRAMUSCULAR | Status: AC
  Filled 2016-04-03: qty 4

## 2016-04-03 NOTE — Consult Note (Signed)
Radiation Oncology         [phone] ________________________________  Name: [name] MRN: [id number]  Date: 04/01/2016  DOB: 1950/12/22  XT:[id number] D Emily Kirks, MD  No ref. provider found     REFERRING PHYSICIAN: No ref. provider found   DIAGNOSIS: The primary encounter diagnosis was Metastatic lung cancer (metastasis from lung to other site), left [facility]). Diagnoses of Confusion, Bilateral renal masses, and Metastatic cancer ([place]) were also pertinent to this visit.   HISTORY OF PRESENT ILLNESS: [name] is a 65 y.o. female seen at the request of Dr. Ellene Route for a probable diagnosis of lung cancer with brain metastases. The patient has a past medical history of diabetes mellitus, SVT, HTN, and Dyslipidemia. She has had several falls at home in the last month and confusion on the date of her admission. She was taken in transfer to [facility] after a CT of the C/A/P and brain revealed concerns for new metastatic lung cancer with at least 6 metastatic deposits in the brain. She has plans to undergo tissue biopsy of either a psoas or renal lesion today. We are asked to see the paitent for consideration of beginning discussion for radiotherapy. She has not undergone MRI of her brain.     PREVIOUS RADIATION THERAPY: No   PAST MEDICAL HISTORY:  Past Medical History:  Diagnosis Date  . Anxiety   . Asthma   . Bronchitis   . Depression   . Essential hypertension   . GERD (gastroesophageal reflux disease)   . Hyperlipidemia   . Lichen sclerosus   . Osteoarthritis   . SVT (supraventricular tachycardia) (HCC)    No inducible arrhythmias by EP study 11/11  . Type 2 diabetes mellitus ([place])        PAST SURGICAL HISTORY: Past Surgical History:  Procedure Laterality Date  . CESAREAN SECTION WITH BILATERAL TUBAL LIGATION  1978  . CHOLECYSTECTOMY  2007  . EP study  11/11     FAMILY HISTORY:  Family History  Problem Relation Age of Onset  . Hypertension Father   . COPD  Father   . Parkinson's disease Father   . Hypertension Mother      SOCIAL HISTORY:  reports that she has quit smoking. Her smoking use included Cigarettes. She has never used smokeless tobacco. She reports that she does not drink alcohol or use drugs. The patient lives in [place].    ALLERGIES: Review of patient's allergies indicates no known allergies.   MEDICATIONS:  Current Facility-Administered Medications  Medication Dose Route Frequency Provider Last Rate Last Dose  . albuterol (PROVENTIL) (2.5 MG/3ML) 0.083% nebulizer solution 2.5 mg  2.5 mg Inhalation Q4H PRN Vianne Bulls, MD      . calcium-vitamin D (OSCAL WITH D) 500-200 MG-UNIT per tablet 0.5 tablet  0.5 tablet Oral Q breakfast Vianne Bulls, MD   Stopped at 04/03/16 0800  . HYDROcodone-acetaminophen (NORCO/VICODIN) 5-325 MG per tablet 1 tablet  1 tablet Oral Q4H PRN Vianne Bulls, MD   1 tablet at 04/03/16 1009  . insulin aspart (novoLOG) injection 0-15 Units  0-15 Units Subcutaneous Q4H Vianne Bulls, MD   3 Units at 04/03/16 1217  . labetalol (NORMODYNE,TRANDATE) injection 5 mg  5 mg Intravenous Q10 min PRN Vianne Bulls, MD      . lisinopril (PRINIVIL,ZESTRIL) tablet 5 mg  5 mg Oral Daily Vianne Bulls, MD   5 mg at 04/03/16 1008  . LORazepam (ATIVAN) tablet 0.5 mg  0.5 mg Oral Q8H PRN Vianne Bulls, MD      . metoprolol succinate (TOPROL-XL) 24 hr tablet 25 mg  25 mg Oral Daily Vianne Bulls, MD   25 mg at 04/03/16 1009  . mometasone-formoterol (DULERA) 200-5 MCG/ACT inhaler 2 puff  2 puff Inhalation BID Vianne Bulls, MD   2 puff at 04/02/16 2018  . multivitamin with minerals tablet 1 tablet  1 tablet Oral Daily Vianne Bulls, MD   Stopped at 04/03/16 1009  . omega-3 acid ethyl esters (LOVAZA) capsule 1 g  1 g Oral Daily Vianne Bulls, MD   Stopped at 04/03/16 1010  . pantoprazole (PROTONIX) EC tablet 40 mg  40 mg Oral Daily Vianne Bulls, MD   40 mg at 04/03/16 1009  . senna-docusate (Senokot-S) tablet 1 tablet   1 tablet Oral QHS PRN Vianne Bulls, MD      . simvastatin (ZOCOR) tablet 20 mg  20 mg Oral QPM Vianne Bulls, MD   20 mg at 04/02/16 1710     REVIEW OF SYSTEMS: On review of systems, the patient reports that she is doing much better today. She has been confused about day and night but has recognized family and knows personal information such as SSN. She denies any chest pain, shortness of breath, cough, fevers, chills, night sweats, unintended weight changes. She denies any bowel or bladder disturbances, and denies abdominal pain, nausea or vomiting. She denies any new musculoskeletal or joint aches or pains. A complete review of systems is obtained and is otherwise negative.     PHYSICAL EXAM:  height is '5\' 2"'$  (1.575 m) and weight is 179 lb 0.2 oz (81.2 kg). Her oral temperature is 98 F (36.7 C). Her blood pressure is 136/60 and her pulse is 74. Her respiration is 17 and oxygen saturation is 95%.   Pain scale 0/10 In general this is a well appearing caucasian female in no acute distress. She is alert and oriented x4 and appropriate throughout the examination. HEENT reveals that the patient is normocephalic, atraumatic. EOMs are intact. PERRLA. Skin is intact without any evidence of gross lesions. Cardiovascular exam reveals a regular rate and rhythm, no clicks rubs or murmurs are auscultated. Chest is clear to auscultation bilaterally. Lymphatic assessment is performed and does not reveal any adenopathy in the cervical, supraclavicular, axillary, or inguinal chains. Abdomen has active bowel sounds in all quadrants and is intact. The abdomen is soft, non tender, non distended. Lower extremities are negative for pretibial pitting edema, deep calf tenderness, cyanosis or clubbing.   ECOG = 3  0 - Asymptomatic (Fully active, able to carry on all predisease activities without restriction)  1 - Symptomatic but completely ambulatory (Restricted in physically strenuous activity but ambulatory and  able to carry out work of a light or sedentary nature. For example, light housework, office work)  2 - Symptomatic, <50% in bed during the day (Ambulatory and capable of all self care but unable to carry out any work activities. Up and about more than 50% of waking hours)  3 - Symptomatic, >50% in bed, but not bedbound (Capable of only limited self-care, confined to bed or chair 50% or more of waking hours)  4 - Bedbound (Completely disabled. Cannot carry on any self-care. Totally confined to bed or chair)  5 - Death   Eustace Pen MM, Creech RH, Tormey DC, et al. [phone]). "Toxicity and response criteria of the [facility]". Am. Lenna Sciara.  Clin. Oncol. 5 (6): 649-55    LABORATORY DATA:  Lab Results  Component Value Date   WBC 9.4 04/03/2016   HGB 9.5 (L) 04/03/2016   HCT 28.8 (L) 04/03/2016   MCV 87.3 04/03/2016   PLT 296 04/03/2016   Lab Results  Component Value Date   NA 134 (L) 04/01/2016   K 4.1 04/01/2016   CL 100 (L) 04/01/2016   CO2 24 04/01/2016   Lab Results  Component Value Date   ALT 16 04/01/2016   AST 22 04/01/2016   ALKPHOS 60 04/01/2016   BILITOT 1.2 04/01/2016      RADIOGRAPHY: Dg Chest 2 View  Result Date: 04/01/2016 CLINICAL DATA:  Chest pain and dizziness. Several falls in last 24 hours. EXAM: CHEST  2 VIEW COMPARISON:  08/26/2015 FINDINGS: The heart size and mediastinal contours are within normal limits. Both lungs are clear. The visualized skeletal structures are unremarkable. IMPRESSION: No active cardiopulmonary disease. Electronically Signed   By: Earle Gell M.D.   On: 04/01/2016 13:38   Ct Head Wo Contrast  Addendum Date: 04/02/2016   ADDENDUM REPORT: 04/02/2016 07:37 ADDENDUM: Study discussed by telephone with Dr. Kristeen Miss on 04/02/2016 at 0720 hours. We discussed that the multiple (at least 6) hyperdense lesions scattered in the brain are very round and discretely marginated. These are most compatible with brain masses. Their  hyperdensity could beyond the basis of hypercellularity, or possibly some internal hemorrhage. However, there is no bona fide acute intracranial hemorrhage. There is edema surrounding the largest lesion measuring 28 mm which is centered at the right deep gray matter, and mild associated regional mass effect. CONCLUSION 1. Multiple hyperdense brain metastases. See also CT Chest Abdomen and Pelvis from the same day. 2. No acute intracranial hemorrhage. Electronically Signed   By: Genevie Ann M.D.   On: 04/02/2016 07:37   Result Date: 04/02/2016 CLINICAL DATA:  Reported pt with multiple falls in last 24 hours and started calling people in middle of night. "feels like screaming" and wants people to do want she wants. Hx dm. EXAM: CT HEAD WITHOUT CONTRAST TECHNIQUE: Contiguous axial images were obtained from the base of the skull through the vertex without intravenous contrast. COMPARISON:  None. FINDINGS: There are multiple brain hemorrhages. In the right base a ganglia centered in the region of the genu of the right internal capsule, there is a large round hemorrhage measuring 2.8 x 2.5 x 2.6 cm. This has mass effect mostly facing the third ventricle and shifting the midline structures to the left by 7 mm. There is surrounding vasogenic edema. There are multiple small hemorrhages along the corticomedullary junctions. There is a hemorrhage in the anterior left temporal lobe another smaller hemorrhage in the posterior left occipital lobe another in the right frontal lobe, 1 in the right cerebellum and a smaller along the peripheral left cerebellum. Another area of hemorrhage lies along the inferior margin of the temporal horn of the right lateral ventricle in the right temporal lobe. There is mild prominence of the lateral ventricles, most notably the atria and temporal horns. This suggests a mild degree of obstructive hydrocephalus. There is mild patchy white matter hypoattenuation consistent with chronic microvascular  ischemic change. There is no evidence of an ischemic infarct. There are no extra-axial masses or abnormal fluid collections. No skull lesion. The visualized sinuses and mastoid air cells are clear. IMPRESSION: 1. Multiple brain hemorrhages. Given the pattern, hemorrhagic metastatic disease is suspected. Alternatively, the large right base a ganglia  hemorrhage may be a hypertensive hemorrhage. Other corticomedullary junction hemorrhages could potentially be traumatic in origin. 2. Recommend nonemergent follow-up chest, abdomen and pelvis CT to assess for neoplastic disease. Electronically Signed: By: Lajean Manes M.D. On: 04/01/2016 14:08   Ct Chest W Contrast  Result Date: 04/01/2016 CLINICAL DATA:  Multiple falls. Hemorrhagic brain metastases seen on recent CT. Diabetes. EXAM: CT CHEST, ABDOMEN, AND PELVIS WITH CONTRAST TECHNIQUE: Multidetector CT imaging of the chest, abdomen and pelvis was performed following the standard protocol during bolus administration of intravenous contrast. CONTRAST:  123m ISOVUE-300 IOPAMIDOL (ISOVUE-300) INJECTION 61% COMPARISON:  AP CT on 07/25/2005 FINDINGS: CT CHEST FINDINGS Cardiovascular: Normal heart size. Mild LAD coronary artery calcification seen. Aortic atherosclerosis noted. Mediastinum/Lymph Nodes: No pathologically enlarged lymph nodes identified. Lungs/Pleura: An irregular pulmonary masses seen in the medial left upper lobe which shows invasion of adjacent mediastinal fat. This measures 3.7 x 2.9 cm on image 13/2. This is suspicious for primary bronchogenic carcinoma. 5 mm perifissural nodule in the right lung along the minor fissure on image 72/series 7 is most consistent with a benign intrapulmonary lymph node. No evidence of pleural effusion. Musculoskeletal: No chest wall mass or suspicious bone lesions identified. CT ABDOMEN PELVIS FINDINGS Hepatobiliary: Moderate hepatic steatosis is seen but decreased since previous study. No liver masses identified. Prior  cholecystectomy noted. No evidence of biliary dilatation. Pancreas: No mass, inflammatory changes, or other significant abnormality. Spleen: Within normal limits in size and appearance. Adrenals/Urinary Tract: Normal adrenal glands. 2.5 cm low-attenuation lesion is seen in the upper pole left kidney on image 20/8, and low-attenuation lesion measuring 2.2 cm is seen in the upper pole of the right kidney on image 23/8. These are new since previous study, are ill-defined, and do not meet criteria for benign cysts. Differential diagnosis includes renal metastases, renal cell carcinomas, and renal abscesses. No evidence of hydronephrosis. Unopacified urinary bladder is unremarkable in appearance. Stomach/Bowel: No evidence of obstruction, inflammatory process, or abnormal fluid collections. Vascular/Lymphatic: No pathologically enlarged lymph nodes. No evidence of abdominal aortic aneurysm. Aortic atherosclerosis noted. Reproductive: No mass or other significant abnormality. Other: None. Musculoskeletal:  No suspicious bone lesions identified. IMPRESSION: 3.7 cm mass in medial left upper lobe with adjacent mediastinal invasion. This is highly suspicious for primary bronchogenic carcinoma. No evidence of thoracic lymphadenopathy or pleural effusion. New ill-defined low-attenuation masses in both kidneys measuring 2-3 cm. Differential diagnosis includes renal metastases, primary renal cell carcinomas, and renal abscesses. Consider abdomen MRI without and with contrast for further characterization. No other sites of metastatic disease identified within the abdomen or pelvis. Decreased hepatic steatosis since prior study. Aortic atherosclerosis and coronary artery calcification. Electronically Signed   By: JEarle GellM.D.   On: 04/01/2016 17:19   Mr Abdomen W Wo Contrast  Result Date: 04/02/2016 CLINICAL DATA:  Renal lesions on CT. Hemorrhagic brain metastasis. Multiple falls. EXAM: MRI ABDOMEN WITHOUT AND WITH CONTRAST  TECHNIQUE: Multiplanar multisequence MR imaging of the abdomen was performed both before and after the administration of intravenous contrast. CONTRAST:  169mMULTIHANCE GADOBENATE DIMEGLUMINE 529 MG/ML IV SOLN COMPARISON:  Abdominal CT of 04/01/2016 FINDINGS: Mild to moderate motion degradation throughout. Lower chest: Normal heart size without pericardial or pleural effusion. Hepatobiliary: Moderate hepatic steatosis. No focal liver lesion. Cholecystectomy, without biliary ductal dilatation. Pancreas: Normal, without mass or ductal dilatation. Spleen: Normal in size, without focal abnormality. Adrenals/Urinary Tract: Normal adrenal glands. Too small to characterize lesions in both kidneys. 1.3 cm upper pole right renal lesion  is T2 hypo intense and demonstrates mild post-contrast enhancement, including on image 56/ series 1003. A 2.0 cm upper pole left renal lesion is also T2 hypointense and demonstrates mild post-contrast enhancement, including on image 46/ series 1005. No hydronephrosis. Stomach/Bowel: Normal stomach and abdominal bowel loops. Vascular/Lymphatic: Normal caliber of the aorta and branch vessels. Patent renal veins. No retroperitoneal or retrocrural adenopathy. Other: No ascites. Musculoskeletal: Right paraspinous muscular enhancing lesion measures 1.3 cm on image 15/series 4. A left high psoas enhancing nodule measures 1.8 cm on image 51/ series 1002. IMPRESSION: 1. Enhancing intramuscular lesions are most consistent with metastasis, presumably from the patient's primary lung cancer. 2. Bilateral hypoenhancing renal lesions with precontrast T2 hypointensity. Favor metastasis. Differential considerations include multiple papillary type renal cell carcinomas versus less likely lipid poor angiomyolipomas. 3. Motion degraded exam. 4. Hepatic steatosis. Electronically Signed   By: Abigail Miyamoto M.D.   On: 04/02/2016 08:47   Ct Abdomen Pelvis W Contrast  Result Date: 04/01/2016 CLINICAL DATA:   Multiple falls. Hemorrhagic brain metastases seen on recent CT. Diabetes. EXAM: CT CHEST, ABDOMEN, AND PELVIS WITH CONTRAST TECHNIQUE: Multidetector CT imaging of the chest, abdomen and pelvis was performed following the standard protocol during bolus administration of intravenous contrast. CONTRAST:  163m ISOVUE-300 IOPAMIDOL (ISOVUE-300) INJECTION 61% COMPARISON:  AP CT on 07/25/2005 FINDINGS: CT CHEST FINDINGS Cardiovascular: Normal heart size. Mild LAD coronary artery calcification seen. Aortic atherosclerosis noted. Mediastinum/Lymph Nodes: No pathologically enlarged lymph nodes identified. Lungs/Pleura: An irregular pulmonary masses seen in the medial left upper lobe which shows invasion of adjacent mediastinal fat. This measures 3.7 x 2.9 cm on image 13/2. This is suspicious for primary bronchogenic carcinoma. 5 mm perifissural nodule in the right lung along the minor fissure on image 72/series 7 is most consistent with a benign intrapulmonary lymph node. No evidence of pleural effusion. Musculoskeletal: No chest wall mass or suspicious bone lesions identified. CT ABDOMEN PELVIS FINDINGS Hepatobiliary: Moderate hepatic steatosis is seen but decreased since previous study. No liver masses identified. Prior cholecystectomy noted. No evidence of biliary dilatation. Pancreas: No mass, inflammatory changes, or other significant abnormality. Spleen: Within normal limits in size and appearance. Adrenals/Urinary Tract: Normal adrenal glands. 2.5 cm low-attenuation lesion is seen in the upper pole left kidney on image 20/8, and low-attenuation lesion measuring 2.2 cm is seen in the upper pole of the right kidney on image 23/8. These are new since previous study, are ill-defined, and do not meet criteria for benign cysts. Differential diagnosis includes renal metastases, renal cell carcinomas, and renal abscesses. No evidence of hydronephrosis. Unopacified urinary bladder is unremarkable in appearance. Stomach/Bowel:  No evidence of obstruction, inflammatory process, or abnormal fluid collections. Vascular/Lymphatic: No pathologically enlarged lymph nodes. No evidence of abdominal aortic aneurysm. Aortic atherosclerosis noted. Reproductive: No mass or other significant abnormality. Other: None. Musculoskeletal:  No suspicious bone lesions identified. IMPRESSION: 3.7 cm mass in medial left upper lobe with adjacent mediastinal invasion. This is highly suspicious for primary bronchogenic carcinoma. No evidence of thoracic lymphadenopathy or pleural effusion. New ill-defined low-attenuation masses in both kidneys measuring 2-3 cm. Differential diagnosis includes renal metastases, primary renal cell carcinomas, and renal abscesses. Consider abdomen MRI without and with contrast for further characterization. No other sites of metastatic disease identified within the abdomen or pelvis. Decreased hepatic steatosis since prior study. Aortic atherosclerosis and coronary artery calcification. Electronically Signed   By: JEarle GellM.D.   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 We will await the results of her biopsy but she likely has stage IV NSCLC and would benefit from radiotherapy. She has not undergone MRI and I will order a T3 MRI with SRS protocol if she is a candidate for this rather than whole brain radiotherapy. I will discuss with Dr. Lisbeth Renshaw and we will plan to follow up with the results once her biopsy today is performed for tissue confirmation. I will also place a referral for medical oncology to evaluate once tissue confirmation has resulted. We briefly discussed radiotherapy and will continue this discussion once more data has been collected. Use of dexamethasone will also be deferred to neurosurgery.      Carola Rhine, PAC

## 2016-04-03 NOTE — Progress Notes (Signed)
Patient remains at [facility] at this time for MRI.

## 2016-04-03 NOTE — Progress Notes (Signed)
Progress Note    [name]  YKD:[id number] DOB: November 07, 1950  DOA: 04/01/2016 PCP: Robert Bellow, MD    Brief Narrative:   [name] is an 65 y.o. female with medical history significant for type 2 diabetes mellitus, hypertension, GERD, and osteoarthritis who was admitted 04/01/16 with one month of intermittent confusion and multiple falls over the last 24 hours. CT of the head without contrast was obtained and notable for multiple brain hemorrhages and a pattern suggestive of metastatic disease. CT of the abdomen and pelvis was obtained, looking for a source of metastases and a 3.7 cm mass was identified in the medial left upper lobe with adjacent mediastinal invasion suspicious for primary bronchogenic carcinoma. New ill-defined low attenuation masses were also noted in the bilateral kidneys, possibly metastases, and MRI of the abdomen showed bilateral kidney lesions as well as a 2 cm upper pole left renal lesion, right paraspinous muscular enhancing lesion and a left high psoas nodule consistent with metastasis.  Assessment/Plan:   Principal Problem:   Metastatic lung cancer (metastasis from lung to other site) [facility]) Complicated by cerebral brain hemorrhage resulting in confusion, lower extremity weakness and falls/bilateral renal masses 3.7 cm mass noted in medial LUL on CT with invasion into mediastinum. Multiple brain hemorrhages noted on CT, largest of which is 2.8 x 2.5 x 2.6 cm at the genu of Rt internal capsule with mass effect on the third ventricle and midline shift of 7 mm.  Evaluated by neurosurgeon with recommendations to proceed with tissue diagnosis from other source. Consulted interventional radiology for CT-guided biopsy of most accessible site, with plans to biopsy so as muscle site today. This is likely a primary bronchogenic carcinoma. Further treatment recommendations pending pathology.  Active Problems:   Asthma, mild persistent Continue Dulera and when  necessary bronchodilators. Stable.    GERD Continue PPI.    Diabetes mellitus type II, non insulin dependent (HCC) Currently being managed with moderate scale SSI every 4 hours. CBGs 161-234. Will add 10 units of Lantus.    Hypertension Continue lisinopril, metoprolol.    Leukocytosis Resolved, no signs of infection.    Hyperlipidemia Continue Zocor and fish oil supplements.  Family Communication/Anticipated D/C date and plan/Code Status   DVT prophylaxis: SCDs ordered. Code Status: Full Code.  Family Communication: Family member updated at the bedside. Disposition Plan: Likely will need SNF given lower extremity weakness, but CIR might be possible.   Medical Consultants:    Interventional Radiology   Procedures:    None  Anti-Infectives:   None  Subjective:   [name] is sleeping. Family at the bedside says she was awake most of the night. No dyspnea. Resting comfortably..  Objective:    Vitals:   04/03/16 0400 04/03/16 0500 04/03/16 0600 04/03/16 0744  BP:    (!) 135/52  Pulse: (!) 59 64 89 67  Resp: 18 11 (!) 24 16  Temp:      TempSrc:      SpO2: 94% 94% 100% 94%  Weight:      Height:        Intake/Output Summary (Last 24 hours) at 04/03/16 0855 Last data filed at 04/03/16 0814  Gross per 24 hour  Intake              120 ml  Output             1200 ml  Net            -  1080 ml   Filed Weights   04/01/16 1137 04/01/16 2127  Weight: 80.3 kg (177 lb) 81.2 kg (179 lb 0.2 oz)    Exam: General exam: Appears calm and comfortable.  Respiratory system: Clear to auscultation. Respiratory effort normal. Cardiovascular system: S1 & S2 heard, RRR. No JVD,  rubs, gallops or clicks. No murmurs. Gastrointestinal system: Abdomen is nondistended, soft and nontender. No organomegaly or masses felt. Normal bowel sounds heard. Central nervous system: Alert and oriented. No focal neurological deficits. Extremities: No clubbing,  or cyanosis. No  edema. Skin: No rashes, lesions or ulcers. Psychiatry: Judgement and insight appear normal. Mood & affect appropriate.   Data Reviewed:   I have personally reviewed following labs and imaging studies:  Labs: Basic Metabolic Panel:  Recent Labs Lab 04/01/16 1210  NA 134*  K 4.1  CL 100*  CO2 24  GLUCOSE 327*  BUN 9  CREATININE 0.64  CALCIUM 8.7*   GFR Estimated Creatinine Clearance: 71 mL/min (by C-G formula based on SCr of 0.8 mg/dL). Liver Function Tests:  Recent Labs Lab 04/01/16 1210  AST 22  ALT 16  ALKPHOS 60  BILITOT 1.2  PROT 7.1  ALBUMIN 3.8   Coagulation profile  Recent Labs Lab 04/01/16 1256  INR 1.05    CBC:  Recent Labs Lab 04/01/16 1210 04/03/16 0312  WBC 13.0* 9.4  NEUTROABS  --  6.6  HGB 11.9* 9.5*  HCT 36.0 28.8*  MCV 87.0 87.3  PLT 323 296   Cardiac Enzymes:  Recent Labs Lab 04/01/16 1256  TROPONINI <0.03   CBG:  Recent Labs Lab 04/02/16 1640 04/02/16 2021 04/03/16 0018 04/03/16 0344 04/03/16 0741  GLUCAP 204* 161* 184* 234* 183*    Microbiology Recent Results (from the past 240 hour(s))  MRSA PCR Screening     Status: None   Collection Time: 04/01/16 10:40 PM  Result Value Ref Range Status   MRSA by PCR NEGATIVE NEGATIVE Final    Comment:        The GeneXpert MRSA Assay (FDA approved for NASAL specimens only), is one component of a comprehensive MRSA colonization surveillance program. It is not intended to diagnose MRSA infection nor to guide or monitor treatment for MRSA infections.     Radiology: Dg Chest 2 View  Result Date: 04/01/2016 CLINICAL DATA:  Chest pain and dizziness. Several falls in last 24 hours. EXAM: CHEST  2 VIEW COMPARISON:  08/26/2015 FINDINGS: The heart size and mediastinal contours are within normal limits. Both lungs are clear. The visualized skeletal structures are unremarkable. IMPRESSION: No active cardiopulmonary disease. Electronically Signed   By: Earle Gell M.D.   On:  04/01/2016 13:38   Ct Head Wo Contrast  Addendum Date: 04/02/2016   ADDENDUM REPORT: 04/02/2016 07:37 ADDENDUM: Study discussed by telephone with Dr. Kristeen Miss on 04/02/2016 at 0720 hours. We discussed that the multiple (at least 6) hyperdense lesions scattered in the brain are very round and discretely marginated. These are most compatible with brain masses. Their hyperdensity could beyond the basis of hypercellularity, or possibly some internal hemorrhage. However, there is no bona fide acute intracranial hemorrhage. There is edema surrounding the largest lesion measuring 28 mm which is centered at the right deep gray matter, and mild associated regional mass effect. CONCLUSION 1. Multiple hyperdense brain metastases. See also CT Chest Abdomen and Pelvis from the same day. 2. No acute intracranial hemorrhage. Electronically Signed   By: Genevie Ann M.D.   On: 04/02/2016 07:37  Result Date: 04/02/2016 CLINICAL DATA:  Reported pt with multiple falls in last 24 hours and started calling people in middle of night. "feels like screaming" and wants people to do want she wants. Hx dm. EXAM: CT HEAD WITHOUT CONTRAST TECHNIQUE: Contiguous axial images were obtained from the base of the skull through the vertex without intravenous contrast. COMPARISON:  None. FINDINGS: There are multiple brain hemorrhages. In the right base a ganglia centered in the region of the genu of the right internal capsule, there is a large round hemorrhage measuring 2.8 x 2.5 x 2.6 cm. This has mass effect mostly facing the third ventricle and shifting the midline structures to the left by 7 mm. There is surrounding vasogenic edema. There are multiple small hemorrhages along the corticomedullary junctions. There is a hemorrhage in the anterior left temporal lobe another smaller hemorrhage in the posterior left occipital lobe another in the right frontal lobe, 1 in the right cerebellum and a smaller along the peripheral left cerebellum. Another  area of hemorrhage lies along the inferior margin of the temporal horn of the right lateral ventricle in the right temporal lobe. There is mild prominence of the lateral ventricles, most notably the atria and temporal horns. This suggests a mild degree of obstructive hydrocephalus. There is mild patchy white matter hypoattenuation consistent with chronic microvascular ischemic change. There is no evidence of an ischemic infarct. There are no extra-axial masses or abnormal fluid collections. No skull lesion. The visualized sinuses and mastoid air cells are clear. IMPRESSION: 1. Multiple brain hemorrhages. Given the pattern, hemorrhagic metastatic disease is suspected. Alternatively, the large right base a ganglia hemorrhage may be a hypertensive hemorrhage. Other corticomedullary junction hemorrhages could potentially be traumatic in origin. 2. Recommend nonemergent follow-up chest, abdomen and pelvis CT to assess for neoplastic disease. Electronically Signed: By: Lajean Manes M.D. On: 04/01/2016 14:08   Ct Chest W Contrast  Result Date: 04/01/2016 CLINICAL DATA:  Multiple falls. Hemorrhagic brain metastases seen on recent CT. Diabetes. EXAM: CT CHEST, ABDOMEN, AND PELVIS WITH CONTRAST TECHNIQUE: Multidetector CT imaging of the chest, abdomen and pelvis was performed following the standard protocol during bolus administration of intravenous contrast. CONTRAST:  121m ISOVUE-300 IOPAMIDOL (ISOVUE-300) INJECTION 61% COMPARISON:  AP CT on 07/25/2005 FINDINGS: CT CHEST FINDINGS Cardiovascular: Normal heart size. Mild LAD coronary artery calcification seen. Aortic atherosclerosis noted. Mediastinum/Lymph Nodes: No pathologically enlarged lymph nodes identified. Lungs/Pleura: An irregular pulmonary masses seen in the medial left upper lobe which shows invasion of adjacent mediastinal fat. This measures 3.7 x 2.9 cm on image 13/2. This is suspicious for primary bronchogenic carcinoma. 5 mm perifissural nodule in the  right lung along the minor fissure on image 72/series 7 is most consistent with a benign intrapulmonary lymph node. No evidence of pleural effusion. Musculoskeletal: No chest wall mass or suspicious bone lesions identified. CT ABDOMEN PELVIS FINDINGS Hepatobiliary: Moderate hepatic steatosis is seen but decreased since previous study. No liver masses identified. Prior cholecystectomy noted. No evidence of biliary dilatation. Pancreas: No mass, inflammatory changes, or other significant abnormality. Spleen: Within normal limits in size and appearance. Adrenals/Urinary Tract: Normal adrenal glands. 2.5 cm low-attenuation lesion is seen in the upper pole left kidney on image 20/8, and low-attenuation lesion measuring 2.2 cm is seen in the upper pole of the right kidney on image 23/8. These are new since previous study, are ill-defined, and do not meet criteria for benign cysts. Differential diagnosis includes renal metastases, renal cell carcinomas, and renal abscesses. No evidence  of hydronephrosis. Unopacified urinary bladder is unremarkable in appearance. Stomach/Bowel: No evidence of obstruction, inflammatory process, or abnormal fluid collections. Vascular/Lymphatic: No pathologically enlarged lymph nodes. No evidence of abdominal aortic aneurysm. Aortic atherosclerosis noted. Reproductive: No mass or other significant abnormality. Other: None. Musculoskeletal:  No suspicious bone lesions identified. IMPRESSION: 3.7 cm mass in medial left upper lobe with adjacent mediastinal invasion. This is highly suspicious for primary bronchogenic carcinoma. No evidence of thoracic lymphadenopathy or pleural effusion. New ill-defined low-attenuation masses in both kidneys measuring 2-3 cm. Differential diagnosis includes renal metastases, primary renal cell carcinomas, and renal abscesses. Consider abdomen MRI without and with contrast for further characterization. No other sites of metastatic disease identified within the  abdomen or pelvis. Decreased hepatic steatosis since prior study. Aortic atherosclerosis and coronary artery calcification. Electronically Signed   By: Earle Gell M.D.   On: 04/01/2016 17:19   Mr Abdomen W Wo Contrast  Result Date: 04/02/2016 CLINICAL DATA:  Renal lesions on CT. Hemorrhagic brain metastasis. Multiple falls. EXAM: MRI ABDOMEN WITHOUT AND WITH CONTRAST TECHNIQUE: Multiplanar multisequence MR imaging of the abdomen was performed both before and after the administration of intravenous contrast. CONTRAST:  77m MULTIHANCE GADOBENATE DIMEGLUMINE 529 MG/ML IV SOLN COMPARISON:  Abdominal CT of 04/01/2016 FINDINGS: Mild to moderate motion degradation throughout. Lower chest: Normal heart size without pericardial or pleural effusion. Hepatobiliary: Moderate hepatic steatosis. No focal liver lesion. Cholecystectomy, without biliary ductal dilatation. Pancreas: Normal, without mass or ductal dilatation. Spleen: Normal in size, without focal abnormality. Adrenals/Urinary Tract: Normal adrenal glands. Too small to characterize lesions in both kidneys. 1.3 cm upper pole right renal lesion is T2 hypo intense and demonstrates mild post-contrast enhancement, including on image 56/ series 1003. A 2.0 cm upper pole left renal lesion is also T2 hypointense and demonstrates mild post-contrast enhancement, including on image 46/ series 1005. No hydronephrosis. Stomach/Bowel: Normal stomach and abdominal bowel loops. Vascular/Lymphatic: Normal caliber of the aorta and branch vessels. Patent renal veins. No retroperitoneal or retrocrural adenopathy. Other: No ascites. Musculoskeletal: Right paraspinous muscular enhancing lesion measures 1.3 cm on image 15/series 4. A left high psoas enhancing nodule measures 1.8 cm on image 51/ series 1002. IMPRESSION: 1. Enhancing intramuscular lesions are most consistent with metastasis, presumably from the patient's primary lung cancer. 2. Bilateral hypoenhancing renal lesions with  precontrast T2 hypointensity. Favor metastasis. Differential considerations include multiple papillary type renal cell carcinomas versus less likely lipid poor angiomyolipomas. 3. Motion degraded exam. 4. Hepatic steatosis. Electronically Signed   By: KAbigail MiyamotoM.D.   On: 04/02/2016 08:47   Ct Abdomen Pelvis W Contrast  Result Date: 04/01/2016 CLINICAL DATA:  Multiple falls. Hemorrhagic brain metastases seen on recent CT. Diabetes. EXAM: CT CHEST, ABDOMEN, AND PELVIS WITH CONTRAST TECHNIQUE: Multidetector CT imaging of the chest, abdomen and pelvis was performed following the standard protocol during bolus administration of intravenous contrast. CONTRAST:  1043mISOVUE-300 IOPAMIDOL (ISOVUE-300) INJECTION 61% COMPARISON:  AP CT on 07/25/2005 FINDINGS: CT CHEST FINDINGS Cardiovascular: Normal heart size. Mild LAD coronary artery calcification seen. Aortic atherosclerosis noted. Mediastinum/Lymph Nodes: No pathologically enlarged lymph nodes identified. Lungs/Pleura: An irregular pulmonary masses seen in the medial left upper lobe which shows invasion of adjacent mediastinal fat. This measures 3.7 x 2.9 cm on image 13/2. This is suspicious for primary bronchogenic carcinoma. 5 mm perifissural nodule in the right lung along the minor fissure on image 72/series 7 is most consistent with a benign intrapulmonary lymph node. No evidence of pleural effusion. Musculoskeletal: No chest  wall mass or suspicious bone lesions identified. CT ABDOMEN PELVIS FINDINGS Hepatobiliary: Moderate hepatic steatosis is seen but decreased since previous study. No liver masses identified. Prior cholecystectomy noted. No evidence of biliary dilatation. Pancreas: No mass, inflammatory changes, or other significant abnormality. Spleen: Within normal limits in size and appearance. Adrenals/Urinary Tract: Normal adrenal glands. 2.5 cm low-attenuation lesion is seen in the upper pole left kidney on image 20/8, and low-attenuation lesion  measuring 2.2 cm is seen in the upper pole of the right kidney on image 23/8. These are new since previous study, are ill-defined, and do not meet criteria for benign cysts. Differential diagnosis includes renal metastases, renal cell carcinomas, and renal abscesses. No evidence of hydronephrosis. Unopacified urinary bladder is unremarkable in appearance. Stomach/Bowel: No evidence of obstruction, inflammatory process, or abnormal fluid collections. Vascular/Lymphatic: No pathologically enlarged lymph nodes. No evidence of abdominal aortic aneurysm. Aortic atherosclerosis noted. Reproductive: No mass or other significant abnormality. Other: None. Musculoskeletal:  No suspicious bone lesions identified. IMPRESSION: 3.7 cm mass in medial left upper lobe with adjacent mediastinal invasion. This is highly suspicious for primary bronchogenic carcinoma. No evidence of thoracic lymphadenopathy or pleural effusion. New ill-defined low-attenuation masses in both kidneys measuring 2-3 cm. Differential diagnosis includes renal metastases, primary renal cell carcinomas, and renal abscesses. Consider abdomen MRI without and with contrast for further characterization. No other sites of metastatic disease identified within the abdomen or pelvis. Decreased hepatic steatosis since prior study. Aortic atherosclerosis and coronary artery calcification. Electronically Signed   By: Earle Gell M.D.   On: 04/01/2016 17:19    Medications:   . calcium-vitamin D  0.5 tablet Oral Q breakfast  . insulin aspart  0-15 Units Subcutaneous Q4H  . lisinopril  5 mg Oral Daily  . metoprolol succinate  25 mg Oral Daily  . mometasone-formoterol  2 puff Inhalation BID  . multivitamin with minerals  1 tablet Oral Daily  . omega-3 acid ethyl esters  1 g Oral Daily  . pantoprazole  40 mg Oral Daily  . simvastatin  20 mg Oral QPM   Continuous Infusions:   Time spent: 35 minutes.  The patient is medically complex with multiple co-morbidities  and is at high risk for clinical deterioration and requires high complexity decision making.    LOS: 2 days   RAMA,CHRISTINA  [facility] Pager [phone]. If unable to reach me by pager, please call my cell phone at [phone].  *Please refer to [email or web address], password [email or web address] to get updated schedule on who will round on this patient, as hospitalists switch teams weekly. If 7PM-7AM, please contact night-coverage at [email or web address], password [email or web address] for any overnight needs.  04/03/2016, 8:55 AM

## 2016-04-03 NOTE — Progress Notes (Signed)
Occupational Therapy Treatment Patient Details Name: [name] MRN: [id number] DOB: 1950/09/12 Today's Date: 04/03/2016    History of present illness SURAYA VIDRINE is a 65 y.o. female with medical history significant for type 2 diabetes mellitus, hypertension, GERD, and osteoarthritis who presents the emergency department with one month of intermittent confusion and multiple falls over the last 24 hours.   CT of the head without contrast was obtained and notable for multiple brain hemorrhages and a pattern suggestive of metastatic disease. CT of the abdomen and pelvis was obtained, looking for a source of metastases and a 3.7 cm mass was identified in the medial left upper lobe with adjacent mediastinal invasion suspicious for primary bronchogenic carcinoma. New ill-defined low attenuation masses were also noted in the bilateral kidneys, possibly metastases   OT comments  Pt is very motivated.  UE strength looks better than yesterday when evaluated.  Pt needs cues for safety when ambulating and bumped into objects on R side.  Will continue to follow  Follow Up Recommendations  CIR    Equipment Recommendations  None recommended by OT    Recommendations for Other Services      Precautions / Restrictions Precautions Precautions: Fall Restrictions Weight Bearing Restrictions: No       Mobility Bed Mobility         Supine to sit: Min assist Sit to supine: Min assist   General bed mobility comments: assist for trunk when sitting up and assist for legs when back to bed  Transfers     Transfers: Sit to/from Stand Sit to Stand: Min assist         General transfer comment: steadying assistance; cues for walker distance; assist to turn walker slowly    Balance     Sitting balance-Leahy Scale: Good       Standing balance-Leahy Scale: Fair Standing balance comment: when washing hands; forearms supported                    ADL       Grooming: Wash/dry  hands;Min guard;Standing       Lower Body Bathing: Minimal assistance;Sit to/from stand           Toilet Transfer: Minimal assistance;Ambulation             General ADL Comments: ambulated to bathroom with min A.  Pt had urgency/incontinece of urine when walking there.        Vision                 Additional Comments: bumped into objects on floor on R side when walking to bathroom   Perception     Praxis      Cognition   Behavior During Therapy: [facility] for tasks assessed/performed Overall Cognitive Status: Impaired/Different from baseline            Safety/Judgement: Decreased awareness of safety     General Comments: safety cues when ambulating to bathroom for walker distance, turns.  Bumped into objects on R when ambulating to bathroom    Extremity/Trunk Assessment               Exercises Other Exercises Other Exercises: pt able to raise both arms at nearly same speed.  Has difficulty opposing L finger to thumb. Used bil UEs together to tie a bow in gown on her lap and open adl containers.   Shoulder Instructions       General Comments  Pertinent Vitals/ Pain       Pain Assessment: No/denies pain  Home Living                                          Prior Functioning/Environment              Frequency Min 3X/week     Progress Toward Goals  OT Goals(current goals can now be found in the care plan section)  Progress towards OT goals: Progressing toward goals     Plan      Co-evaluation                 End of Session Equipment Utilized During Treatment: Gait belt;Rolling walker   Activity Tolerance Patient tolerated treatment well   Patient Left in bed;with call bell/phone within reach;with bed alarm set;with family/visitor present   Nurse Communication          Time: 1325-1345 OT Time Calculation (min): 20 min  Charges: OT General Charges $OT Visit: 1 Procedure OT Treatments $Self  Care/Home Management : 8-22 mins  Canio Winokur 04/03/2016, 2:30 PM  Lesle Chris, OTR/L [phone] 04/03/2016

## 2016-04-03 NOTE — Procedures (Signed)
Interventional Radiology Procedure Note  Procedure:  CT guided biopsy of left psoas muscle.  Target was contour abnormality which corresponds to prior MRI signal abnormality.  Presumed met.  Complications: None Recommendations:  - Ok to shower tomorrow - Do not submerge for 7 days - Routine wound care - Follow up pathology   Signed,  Dulcy Fanny. Earleen Newport, DO

## 2016-04-03 NOTE — Progress Notes (Signed)
Return from [facility] MRI, per [facility] NAD noted.

## 2016-04-04 ENCOUNTER — Ambulatory Visit
Admit: 2016-04-04 | Discharge: 2016-04-04 | Disposition: A | Discharge status: Home / Self Care | Payer: PPO | Attending: Radiation Oncology | Admitting: Radiation Oncology

## 2016-04-04 DIAGNOSIS — C3492 Malignant neoplasm of unspecified part of left bronchus or lung: Secondary | ICD-10-CM

## 2016-04-04 DIAGNOSIS — C7931 Secondary malignant neoplasm of brain: Secondary | ICD-10-CM

## 2016-04-04 LAB — BASIC METABOLIC PANEL
Anion gap: 9 (ref 5–15)
BUN: 9 mg/dL (ref 6–20)
CALCIUM: 8.5 mg/dL — AB (ref 8.9–10.3)
CO2: 26 mmol/L (ref 22–32)
CREATININE: 0.62 mg/dL (ref 0.44–1.00)
Chloride: 100 mmol/L — ABNORMAL LOW (ref 101–111)
GFR calc Af Amer: >60 mL/min (ref >60)
Glucose, Bld: 163 mg/dL — ABNORMAL HIGH (ref 65–99)
Potassium: 3.1 mmol/L — ABNORMAL LOW (ref 3.5–5.1)
SODIUM: 135 mmol/L (ref 135–145)

## 2016-04-04 LAB — GLUCOSE, CAPILLARY
GLUCOSE-CAPILLARY: 143 mg/dL — AB (ref 65–99)
GLUCOSE-CAPILLARY: 202 mg/dL — AB (ref 65–99)
GLUCOSE-CAPILLARY: 208 mg/dL — AB (ref 65–99)
Glucose-Capillary: 261 mg/dL — ABNORMAL HIGH (ref 65–99)
Glucose-Capillary: 230 mg/dL — ABNORMAL HIGH (ref 65–99)
Glucose-Capillary: 179 mg/dL — ABNORMAL HIGH (ref 65–99)

## 2016-04-04 LAB — CBC WITH DIFFERENTIAL/PLATELET
Basophils Absolute: 0 10*3/uL (ref 0.0–0.1)
Basophils Relative: 0 %
EOS ABS: 0.4 10*3/uL (ref 0.0–0.7)
EOS PCT: 4 %
HCT: 31.8 % — ABNORMAL LOW (ref 36.0–46.0)
Hemoglobin: 10.6 g/dL — ABNORMAL LOW (ref 12.0–15.0)
LYMPHS ABS: 1.7 10*3/uL (ref 0.7–4.0)
Lymphocytes Relative: 15 %
MCH: 28.6 pg (ref 26.0–34.0)
MCHC: 33.3 g/dL (ref 30.0–36.0)
MCV: 85.9 fL (ref 78.0–100.0)
MONOS PCT: 8 %
Monocytes Absolute: 0.9 10*3/uL (ref 0.1–1.0)
Neutro Abs: 8 10*3/uL — ABNORMAL HIGH (ref 1.7–7.7)
Neutrophils Relative %: 73 %
PLATELETS: 341 10*3/uL (ref 150–400)
RBC: 3.7 MIL/uL — ABNORMAL LOW (ref 3.87–5.11)
RDW: 13.3 % (ref 11.5–15.5)
WBC: 11 10*3/uL — AB (ref 4.0–10.5)

## 2016-04-04 MED ORDER — INSULIN ASPART 100 UNIT/ML ~~LOC~~ SOLN: 0.0000 [IU] | Freq: Three times a day (TID) | SUBCUTANEOUS | Status: DC

## 2016-04-04 MED ORDER — ACETAMINOPHEN 325 MG PO TABS
650.0000 mg | ORAL_TABLET | Freq: Four times a day (QID) | ORAL | Status: DC | PRN
  Administered 2016-04-05 – 2016-04-06 (×3): 650 mg via ORAL
  Filled 2016-04-04 (×3): qty 2

## 2016-04-04 MED ORDER — INSULIN ASPART 100 UNIT/ML ~~LOC~~ SOLN
0.0000 [IU] | Freq: Every day | SUBCUTANEOUS | Status: DC
  Administered 2016-04-04 – 2016-04-05 (×2): 2 [IU] via SUBCUTANEOUS

## 2016-04-04 MED ORDER — INSULIN ASPART 100 UNIT/ML ~~LOC~~ SOLN
0.0000 [IU] | Freq: Three times a day (TID) | SUBCUTANEOUS | Status: DC
  Administered 2016-04-04: 3 [IU] via SUBCUTANEOUS
  Administered 2016-04-05 (×3): 5 [IU] via SUBCUTANEOUS
  Administered 2016-04-06: 8 [IU] via SUBCUTANEOUS

## 2016-04-04 MED ORDER — POTASSIUM CHLORIDE CRYS ER 20 MEQ PO TBCR
40.0000 meq | EXTENDED_RELEASE_TABLET | ORAL | Status: AC
  Administered 2016-04-04 (×2): 40 meq via ORAL
  Filled 2016-04-04 (×2): qty 2

## 2016-04-04 NOTE — Progress Notes (Signed)
After by this technique with the patient and her family. Further workup patient, her sister, and husband. We reviewed the findings from her MRI scan, and because of the multiple metastases, feel that she is a better candidate for whole brain radiation. We discussed the risks, benefits, short and long-term effects of radiotherapy. She is interested in moving forward, and we discussed that we would begin radiation once diagnosis has been confirmed. We anticipate this to be in the next day or so. In the meantime, we discussed the options for moving forward with CT simulation and planning in anticipation of radiation. She is interested in this and has come to our department this afternoon. Written consent was obtained. Dr. Lisbeth Renshaw also discusses that once we have pathology confirmation, it would be reasonable to consider palliative radiotherapy to the lung lesion. We will involve medical oncology as well and I will discuss the patient's case with Dr. Julien Nordmann. She states agreement and understanding. She remained off of any steroids at this time, and we have contacted Dr. Ellene Route to determine his thoughts on adding this.    Carola Rhine, PAC

## 2016-04-04 NOTE — Clinical Social Work Note (Signed)
Clinical Social Work Assessment  Patient Details  Name: [name] MRN: [id number] Date of Birth: 1950/09/01  Date of referral:  04/04/16               Reason for consult:  Discharge Planning                Permission sought to share information with:  Chartered certified accountant granted to share information::  Yes, Verbal Permission Granted  Name::        Agency::     Relationship::     Contact Information:     Housing/Transportation Living arrangements for the past 2 months:  Single Family Home Source of Information:  Other (Comment Required) (sister) Patient Interpreter Needed:  None Criminal Activity/Legal Involvement Pertinent to Current Situation/Hospitalization:  No - Comment as needed Significant Relationships:    Lives with:  Adult Children, Siblings Do you feel safe going back to the place where you live?   (Rehab recommended.) Need for family participation in patient care:  Yes (Comment)  Care giving concerns:  Pt's care cannot be managed at home following hospital d/c.   Social Worker assessment / plan:  Pt hospitalized on 04/01/16 from home with falls, confusion,left sided weakness. PT has recommended CIR at d/c. CSW met with pt 's sister to assist with d/c planning. Pt was sleeping soundly during visit. Pt's sister texted pt's daughter during visit to give daughter opportunity to ask questions. Pt / family are hoping for CIR admission when stable for d/c but are willing to consider SNF placement if CIR is unable to assist. SNF list provided to sister. CSW referred sister to [facility] to begin [place] process. Daughter is interested in [facility] for pt.  CSW / [place] services is able to assist with HCPOA. Pt / family will contact CSW if assistance is needed. CSW Cell # provided to sister to share with pt's daughter. CSW will continue to follow to assist with d/c planning needs.  Employment status:  Unemployed Office manager PT Recommendations:  Inpatient Rehab Consult Information / Referral to community resources:  [place]  Patient/Family's Response to care:  CIR vs SNF . Disposition to be determined.  Patient/Family's Understanding of and Emotional Response to Diagnosis, Current Treatment, and Prognosis:  Pt / family are awaiting test results. Sister reports that once results are available pt's needs may be clearer. Family have suffered recent losses and sister is tearful. "We're overwhelmed." CSW provided support / reassurance.  Emotional Assessment Appearance:  Appears stated age Attitude/Demeanor/Rapport:  Unable to Assess Affect (typically observed):  Unable to Assess Orientation:  Oriented to Self, Oriented to Place, Oriented to  Time, Oriented to Situation Alcohol / Substance use:  Not Applicable Psych involvement (Current and /or in the community):  No (Comment)  Discharge Needs  Concerns to be addressed:  Discharge Planning Concerns Readmission within the last 30 days:  No Current discharge risk:  None Barriers to Discharge:  No Barriers Identified   Luretha Rued, [place] 04/04/2016, 10:17 AM

## 2016-04-04 NOTE — Progress Notes (Signed)
[facility] Progress Note  Patient: [name] DTO:[id number]   PCP: Robert Bellow, MD DOB: February 18, 1951   DOA: 04/01/2016   DOS: 04/04/2016   Date of Service: the patient was seen and examined on 04/04/2016  Subjective: Mild frontal headache is present. Denies any nausea or vomiting. Confusion is also resolved. No chest pain or shortness of breath. No other focal deficit reported. Nutrition: Minimal oral intake  Brief hospital course: [name] is an 65 y.o. female with medical history significant for type 2 diabetes mellitus, hypertension, GERD, and osteoarthritis who was admitted 04/01/16 with one month of intermittent confusion and multiple falls over the last 24 hours. CT of the head without contrast was obtained and notable for multiple brain hemorrhages and a pattern suggestive of metastatic disease. CT of the abdomen and pelvis was obtained, looking for a source of metastases and a 3.7 cm mass was identified in the medial left upper lobe with adjacent mediastinal invasion suspicious for primary bronchogenic carcinoma. New ill-defined low attenuation masses were also noted in the bilateral kidneys, possiblymetastases, and MRI of the abdomen showed bilateral kidney lesions as well as a 2 cm upper pole left renal lesion, right paraspinous muscular enhancing lesion and a left high psoas nodule consistent with metastasis. Currently further plan is a week for biopsy results and arrange for radiation as well as further workup.  Assessment and Plan: 1 Metastatic lung cancer (metastasis from lung to other site) [facility]) Complicated by cerebral brain hemorrhage resulting in confusion, lower extremity weakness and falls/bilateral renal masses 3.7 cm mass noted in medial LUL on CT with invasion into mediastinum. Multiple brain hemorrhages noted on CT, largest of which is 2.8 x 2.5 x 2.6 cm at the genu of Rt internal capsule with mass effect on the third ventricle and midline shift of 7 mm.   Evaluated by neurosurgeon with recommendations to proceed with tissue diagnosis from other source.  Consulted interventional radiology for CT-guided biopsy of some was present, results pending. This is likely a primary bronchogenic carcinoma. Further treatment recommendations pending pathology. Radiation oncology consulted medical oncology as well as neurosurgery. We will await recommendation regarding steroids.  Active Problems:   Asthma, mild persistent Continue Dulera and when necessary bronchodilators. Stable.    GERD Continue PPI.    Diabetes mellitus type II, non insulin dependent (HCC) Currently being managed with moderate scale SSI  Will add 10 units of Lantus.    Hypertension Continue lisinopril, metoprolol.    Leukocytosis Resolved, no signs of infection.    Hyperlipidemia Continue Zocor and fish oil supplements. . Pain management: When necessary Tylenol Activity: Consulted physical therapy Bowel regimen: last BM 04/02/2016 Diet: Cardiac diet DVT Prophylaxis: SCD  Advance goals of care discussion: Full code  Family Communication: family was present at bedside, at the time of interview. The pt provided permission to discuss medical plan with the family. Opportunity was given to ask question and all questions were answered satisfactorily.   Disposition:  Discharge to [facility] versus SNF. Expected discharge date: 04/07/2016 biopsy results,   Consultants: Oncology, radiation oncology, neurosurgery Procedures: CT-guided biopsy  Antibiotics: Anti-infectives    None        Intake/Output Summary (Last 24 hours) at 04/04/16 1847 Last data filed at 04/04/16 1800  Gross per 24 hour  Intake              940 ml  Output             1050 ml  Net             -110 ml   Filed Weights   04/01/16 1137 04/01/16 2127  Weight: 80.3 kg (177 lb) 81.2 kg (179 lb 0.2 oz)    Objective: Physical Exam: Vitals:   04/04/16 1000 04/04/16 1200 04/04/16 1400 04/04/16 1638    BP: 124/61 111/90 121/60 127/70  Pulse: 63 65 64 63  Resp: '19 19 17 18  '$ Temp:  98.8 F (37.1 C)  98.9 F (37.2 C)  TempSrc:  Oral  Oral  SpO2: 95% 97% 98% 97%  Weight:      Height:        General: Alert, Awake and Oriented to Time, Place and Person. Appear in mild distress Eyes: PERRL, Conjunctiva normal ENT: Oral Mucosa clear moist. Neck: no JVD, no Abnormal Mass Or lumps Cardiovascular: S1 and S2 Present, no Murmur, Respiratory: Bilateral Air entry equal and Decreased, Clear to Auscultation, no Crackles, no wheezes Abdomen: Bowel Sound present, Soft and no tenderness Skin: no redness, no Rash  Extremities: no Pedal edema, no calf tenderness Neurologic: Grossly no focal neuro deficit. Bilaterally Equal motor strength  Data Reviewed: CBC:  Recent Labs Lab 04/01/16 1210 04/03/16 0312 04/04/16 0703  WBC 13.0* 9.4 11.0*  NEUTROABS  --  6.6 8.0*  HGB 11.9* 9.5* 10.6*  HCT 36.0 28.8* 31.8*  MCV 87.0 87.3 85.9  PLT 323 296 993   Basic Metabolic Panel:  Recent Labs Lab 04/01/16 1210 04/04/16 0703  NA 134* 135  K 4.1 3.1*  CL 100* 100*  CO2 24 26  GLUCOSE 327* 163*  BUN 9 9  CREATININE 0.64 0.62  CALCIUM 8.7* 8.5*    Liver Function Tests:  Recent Labs Lab 04/01/16 1210  AST 22  ALT 16  ALKPHOS 60  BILITOT 1.2  PROT 7.1  ALBUMIN 3.8   No results for input(s): LIPASE, AMYLASE in the last 168 hours. No results for input(s): AMMONIA in the last 168 hours. Coagulation Profile:  Recent Labs Lab 04/01/16 1256  INR 1.05   Cardiac Enzymes:  Recent Labs Lab 04/01/16 1256  TROPONINI <0.03   BNP (last 3 results) No results for input(s): PROBNP in the last 8760 hours.  CBG:  Recent Labs Lab 04/04/16 0002 04/04/16 0345 04/04/16 0737 04/04/16 1141 04/04/16 1729  GLUCAP 202* 261* 143* 230* 179*    Studies: [name] Contrast  Result Date: 04/04/2016 CLINICAL DATA:  65 y/o F; lung cancer with brain metastasis, etc starting patient.  Increasing confusion. EXAM: MRI HEAD WITHOUT AND WITH CONTRAST TECHNIQUE: Multiplanar, multiecho pulse sequences of the brain and surrounding structures were obtained without and with intravenous contrast. CONTRAST:  32m MULTIHANCE GADOBENATE DIMEGLUMINE 529 MG/ML IV SOLN COMPARISON:  CT of the head dated 04/01/2016. FINDINGS: Brain: There are multiple foci (12-15) of susceptibility hypointensity compatible with hemorrhage throughout the supratentorial and infratentorial brain, the left thalamus, and within the right basal ganglia. The hyperdense lesions on the prior CT of the head have corresponding susceptibility foci and are consistent with acute hemorrhage. There are additional foci of susceptibility not appreciable on CT which may represent pole hemorrhage for example in the left thalamus and right lateral cerebellar hemisphere. The postcontrast images are severely motion degraded with a suboptimal assessment for enhancement. The larger lesions demonstrate T2 FLAIR hyperintensity likely representing vasogenic edema. The largest lesion in the right basal ganglia measures approximately 30 x 32 mm axially (series 8, image 14) and exerts mass effect with partial effacement  of the third ventricle and right lateral ventricle and a 6 mm of right-to-left midline shift of septum pellucidum. Extra-axial space: Small diameter hemorrhage pooling within the occipital horns of the lateral ventricles. No hydrocephalus. No additional extra-axial collection is identified. Other: No abnormal signal of the paranasal sinuses. No abnormal signal of the mastoid air cells. Orbits are unremarkable. Calvarium is unremarkable. IMPRESSION: 1. Multiple foci (12-15) of susceptibility hypointensity throughout the supra and infratentorial brain as well as the basal ganglia consistent with hemorrhage. Postcontrast images are severely motion degraded with a suboptimal assessment for enhancement. Some foci of susceptibility do not have a CT  correlate and may represent older hemorrhage. Findings probably represent hemorrhagic metastatic disease. Some foci may be related to hypertension. Consider repeat postcontrast images with sedation when patient is able. 2. Vasogenic edema and mass effect associated with the largest right basal ganglia lesion partially effaces the third and right lateral ventricle and results in stable 6 mm of right to left midline shift. Stable small volume of intraventricular hemorrhage. Electronically Signed   By: Kristine Garbe M.D.   On: 04/04/2016 01:13     Scheduled Meds: . calcium-vitamin D  0.5 tablet Oral Q breakfast  . insulin aspart  0-15 Units Subcutaneous TID WC  . insulin aspart  0-5 Units Subcutaneous QHS  . insulin glargine  10 Units Subcutaneous QHS  . lisinopril  5 mg Oral Daily  . metoprolol succinate  25 mg Oral Daily  . mometasone-formoterol  2 puff Inhalation BID  . multivitamin with minerals  1 tablet Oral Daily  . omega-3 acid ethyl esters  1 g Oral Daily  . pantoprazole  40 mg Oral Daily  . simvastatin  20 mg Oral QPM   Continuous Infusions:  PRN Meds: albuterol, HYDROcodone-acetaminophen, labetalol, LORazepam, ondansetron, senna-docusate  Time spent: 30 minutes  Author: Berle Mull, MD [facility] Pager: [phone] 04/04/2016 6:47 PM  If 7PM-7AM, please contact night-coverage at [email or web address], password [facility]

## 2016-04-04 NOTE — Progress Notes (Signed)
OT Cancellation Note  Patient Details Name: [name] MRN: [id number] DOB: 1951/03/23   Cancelled Treatment:    Reason Eval/Treat Not Completed: Fatigue/lethargy limiting ability to participate.  Per daughter, pt just got some news about her condition and is absorbing it.  Pt states that she would like to rest, if she can. Will check back tomorrow.  [name] 04/04/2016, 2:15 PM  Lesle Chris, OTR/L [phone] 04/04/2016

## 2016-04-04 NOTE — Progress Notes (Signed)
Physical Therapy Treatment Patient Details Name: [name] MRN: [id number] DOB: May 27, 1951 Today's Date: 04/04/2016    History of Present Illness [name] is a 65 y.o. female with medical history significant for type 2 diabetes mellitus, hypertension, GERD, and osteoarthritis who presents the emergency department with one month of intermittent confusion and multiple falls over the last 24 hours.   CT of the head without contrast was obtained and notable for multiple brain hemorrhages and a pattern suggestive of metastatic disease. CT of the abdomen and pelvis was obtained, looking for a source of metastases and a 3.7 cm mass was identified in the medial left upper lobe with adjacent mediastinal invasion suspicious for primary bronchogenic carcinoma. New ill-defined low attenuation masses were also noted in the bilateral kidneys, possibly metastases    PT Comments    Pt cooperative with encouragement; fatigues rapidly, dizzy with amb but possibly d/t meds; VSS  Follow Up Recommendations  CIR;Supervision/Assistance - 24 hour     Equipment Recommendations  Rolling walker with 5" wheels    Recommendations for Other Services       Precautions / Restrictions Precautions Precautions: Fall Restrictions Weight Bearing Restrictions: No    Mobility  Bed Mobility Overal bed mobility: Needs Assistance Bed Mobility: Rolling;Sidelying to Sit Rolling: Min guard Sidelying to sit: Min assist       General bed mobility comments: assist for trunk upright, cues to self assist  Transfers Overall transfer level: Needs assistance Equipment used: Rolling walker (2 wheeled) Transfers: Sit to/from Stand Sit to Stand: Min assist         General transfer comment: verbal cues for hand placement, assist for steadying upon rise  Ambulation/Gait Ambulation/Gait assistance: Min assist Ambulation Distance (Feet): 60 Feet Assistive device: Rolling walker (2 wheeled) Gait  Pattern/deviations: Step-to pattern;Trunk flexed;Decreased weight shift to left;Decreased stance time - left Gait velocity: decr   General Gait Details: pt with LLE weakness/fatigue,    Stairs            Wheelchair Mobility    Modified Rankin (Stroke Patients Only)       Balance             Standing balance-Leahy Scale: Poor                      Cognition Arousal/Alertness: Awake/alert Behavior During Therapy: WFL for tasks assessed/performed Overall Cognitive Status: Impaired/Different from baseline         Following Commands: Follows one step commands with increased time Safety/Judgement: Decreased awareness of safety   Problem Solving: Slow processing;Difficulty sequencing;Requires verbal cues;Requires tactile cues      Exercises      General Comments        Pertinent Vitals/Pain Pain Assessment: No/denies pain (had a HA, better now)    Home Living                      Prior Function            PT Goals (current goals can now be found in the care plan section) Acute Rehab PT Goals Patient Stated Goal: to get this figured out so I can know what my treatment options are PT Goal Formulation: With patient Time For Goal Achievement: 04/16/16 Potential to Achieve Goals: Good Progress towards PT goals: Progressing toward goals    Frequency  Min 3X/week    PT Plan Current plan remains appropriate    Co-evaluation  End of Session Equipment Utilized During Treatment: Gait belt Activity Tolerance: Patient tolerated treatment well;Patient limited by fatigue Patient left: in chair;with call bell/phone within reach;with chair alarm set     Time: 1206-1233 PT Time Calculation (min) (ACUTE ONLY): 27 min  Charges:  $Gait Training: 8-22 mins                    G Codes:      [name] 2016-04-23, 12:55 PM

## 2016-04-05 ENCOUNTER — Ambulatory Visit
Admit: 2016-04-05 | Discharge: 2016-04-05 | Disposition: A | Discharge status: Home / Self Care | Payer: PPO | Attending: Radiation Oncology | Admitting: Radiation Oncology

## 2016-04-05 ENCOUNTER — Inpatient Hospital Stay (HOSPITAL_COMMUNITY): Payer: PPO

## 2016-04-05 LAB — GLUCOSE, CAPILLARY
GLUCOSE-CAPILLARY: 218 mg/dL — AB (ref 65–99)
GLUCOSE-CAPILLARY: 255 mg/dL — AB (ref 65–99)
GLUCOSE-CAPILLARY: 250 mg/dL — AB (ref 65–99)
Glucose-Capillary: 214 mg/dL — ABNORMAL HIGH (ref 65–99)

## 2016-04-05 LAB — CBC
HCT: 32.7 % — ABNORMAL LOW (ref 36.0–46.0)
Hemoglobin: 10.7 g/dL — ABNORMAL LOW (ref 12.0–15.0)
MCH: 28.8 pg (ref 26.0–34.0)
MCHC: 32.7 g/dL (ref 30.0–36.0)
MCV: 87.9 fL (ref 78.0–100.0)
PLATELETS: 285 10*3/uL (ref 150–400)
RBC: 3.72 MIL/uL — AB (ref 3.87–5.11)
RDW: 13.2 % (ref 11.5–15.5)
WBC: 9.8 10*3/uL (ref 4.0–10.5)

## 2016-04-05 LAB — LIPID PANEL
CHOL/HDL RATIO: 4.6 ratio
Cholesterol: 116 mg/dL (ref 0–200)
HDL: 25 mg/dL — ABNORMAL LOW (ref >40)
LDL CALC: 64 mg/dL (ref 0–99)
Triglycerides: 133 mg/dL (ref <150)
VLDL: 27 mg/dL (ref 0–40)

## 2016-04-05 LAB — BASIC METABOLIC PANEL
Anion gap: 9 (ref 5–15)
BUN: 11 mg/dL (ref 6–20)
CALCIUM: 8.9 mg/dL (ref 8.9–10.3)
CHLORIDE: 101 mmol/L (ref 101–111)
CO2: 26 mmol/L (ref 22–32)
CREATININE: 0.47 mg/dL (ref 0.44–1.00)
Glucose, Bld: 203 mg/dL — ABNORMAL HIGH (ref 65–99)
Potassium: 4 mmol/L (ref 3.5–5.1)
SODIUM: 136 mmol/L (ref 135–145)

## 2016-04-05 LAB — PROCALCITONIN

## 2016-04-05 MED ORDER — DEXAMETHASONE 4 MG PO TABS
4.0000 mg | ORAL_TABLET | Freq: Three times a day (TID) | ORAL | Status: DC
  Administered 2016-04-05 – 2016-04-07 (×7): 4 mg via ORAL
  Filled 2016-04-05 (×7): qty 1

## 2016-04-05 MED ORDER — INSULIN ASPART 100 UNIT/ML ~~LOC~~ SOLN
3.0000 [IU] | Freq: Three times a day (TID) | SUBCUTANEOUS | Status: DC
Start: 2016-04-06 — End: 2016-04-06
  Administered 2016-04-06: 3 [IU] via SUBCUTANEOUS

## 2016-04-05 NOTE — Care Management Important Message (Signed)
Important Message  Patient Details  Name: [name] MRN: [id number] Date of Birth: 1951-04-05   Medicare Important Message Given:  Yes    Camillo Flaming 04/05/2016, 10:26 AMImportant Message  Patient Details  Name: [name] MRN: [id number] Date of Birth: 1950/11/21   Medicare Important Message Given:  Yes    Camillo Flaming 04/05/2016, 10:26 AM

## 2016-04-05 NOTE — Progress Notes (Signed)
OT Cancellation Note  Patient Details Name: [name] MRN: [id number] DOB: 11/23/1950   Cancelled Treatment:    Reason Eval/Treat Not Completed: Other (comment).  Pt just got medication for nausea and HA. Will check back later today if schedule permits, or tomorrow.  Dawaun Brancato 04/05/2016, 2:10 PM  Lesle Chris, OTR/L [phone] 04/05/2016

## 2016-04-05 NOTE — Progress Notes (Signed)
Inpatient Diabetes Program Recommendations  AACE/ADA: New Consensus Statement on Inpatient Glycemic Control (2015)  Target Ranges:  Prepandial:   less than 140 mg/dL      Peak postprandial:   less than 180 mg/dL (1-2 hours)      Critically ill patients:  140 - 180 mg/dL   Results for [name], [name] (MRN [id number]) as of 04/05/2016 08:25  Ref. Range 04/04/2016 07:37 04/04/2016 11:41 04/04/2016 17:29 04/04/2016 21:44  Glucose-Capillary Latest Ref Range: 65 - 99 mg/dL 143 (H) 230 (H) 179 (H) 208 (H)   Results for [name], [name] (MRN [id number]) as of 04/05/2016 08:25  Ref. Range 04/05/2016 08:03  Glucose-Capillary Latest Ref Range: 65 - 99 mg/dL 218 (H)    Home DM Meds: Glipizide 10 mg bid       Metformin 850 mg bid  Current Insulin Orders: Lantus 10 units QHS      Novolog Moderate Correction Scale/ SSI (0-15 units) TID AC + HS      MD- Please consider the following in-hospital insulin adjustments while home PO DM meds are on hold:  1. Increase Lantus to 12 units QHS  2. Start low dose Novolog Meal Coverage: Novolog 3 units tid with meals (hold if pt eats <50% of meal)      --Will follow patient during hospitalization--  Wyn Quaker RN, MSN, CDE Diabetes Coordinator Inpatient Glycemic Control Team Team Pager: [phone] (8a-5p)

## 2016-04-05 NOTE — Progress Notes (Signed)
[facility] Progress Note  Patient: [name] TJQ:[id number]   PCP: Robert Bellow, MD DOB: 01-05-1951   DOA: 04/01/2016   DOS: 04/05/2016   Date of Service: the patient was seen and examined on 04/05/2016  Subjective: Patient is more awake. Denies any complains of chest pain or abdominal pain. Headache is also resolved as per the patient. No focal deficit reported. Nutrition: Minimal oral intake  Brief hospital course: EMILA STEINHAUSER is an 65 y.o. female with medical history significant for type 2 diabetes mellitus, hypertension, GERD, and osteoarthritis who was admitted 04/01/16 with one month of intermittent confusion and multiple falls over the last 24 hours. CT of the head without contrast was obtained and notable for multiple brain hemorrhages and a pattern suggestive of metastatic disease. CT of the abdomen and pelvis was obtained, looking for a source of metastases and a 3.7 cm mass was identified in the medial left upper lobe with adjacent mediastinal invasion suspicious for primary bronchogenic carcinoma. New ill-defined low attenuation masses were also noted in the bilateral kidneys, possiblymetastases, and MRI of the abdomen showed bilateral kidney lesions as well as a 2 cm upper pole left renal lesion, right paraspinous muscular enhancing lesion and a left high psoas nodule consistent with metastasis. Currently further plan is a week for biopsy results and arrange for radiation as well as further workup.  Assessment and Plan: 1 Metastatic lung cancer (metastasis from lung to other site) [facility]) Complicated by cerebral brain hemorrhage resulting in confusion, lower extremity weakness and falls/bilateral renal masses 3.7 cm mass noted in medial LUL on CT with invasion into mediastinum. Multiple brain hemorrhages noted on CT, largest of which is 2.8 x 2.5 x 2.6 cm at the genu of Rt internal capsule with mass effect on the third ventricle and midline shift of 7 mm.  Evaluated by  neurosurgeon with recommendations to proceed with tissue diagnosis from other source.  Consulted interventional radiology for CT-guided biopsy of some was present, results suggest poorly differentiated carcinoma. This is likely a primary bronchogenic carcinoma.  Radiation oncology consulted medical oncology as well as neurosurgery.  Patient started on Decadron, I would lead scheduled insulin to her regimen.  Active Problems:   Asthma, mild persistent Continue Dulera and when necessary bronchodilators. Stable.    GERD Continue PPI.    Diabetes mellitus type II, non insulin dependent (HCC) Currently being managed with moderate scale SSI  Will add 10 units of Lantus. Also add before meals insulin regimen    Hypertension Continue lisinopril, metoprolol.    Leukocytosis Resolved, no signs of infection.    Hyperlipidemia Continue Zocor and fish oil supplements.  Fever. When necessary Tylenol. Check UA, check sinus x-ray, check pro-calcitonin as well as blood cultures. . Pain management: When necessary Tylenol Activity: Consulted physical therapy Bowel regimen: last BM 04/04/2016 Diet: Cardiac diet DVT Prophylaxis: SCD  Advance goals of care discussion: Full code  Family Communication: no family was present at bedside, at the time of interview.   Disposition:  Discharge to SNF. Expected discharge date: 04/08/2016 biopsy results, pending improvement in fever  Consultants: Oncology, radiation oncology, neurosurgery Procedures: CT-guided biopsy  Antibiotics: Anti-infectives    None        Intake/Output Summary (Last 24 hours) at 04/05/16 1933 Last data filed at 04/05/16 1830  Gross per 24 hour  Intake              390 ml  Output  250 ml  Net              140 ml   Filed Weights   04/01/16 1137 04/01/16 2127  Weight: 80.3 kg (177 lb) 81.2 kg (179 lb 0.2 oz)    Objective: Physical Exam: Vitals:   04/04/16 2146 04/05/16 0142 04/05/16 0614  04/05/16 1416  BP: 139/70  (!) 118/59 (!) 134/57  Pulse: 73  67 80  Resp: '18  18 20  '$ Temp: 99.7 F (37.6 C) 98.7 F (37.1 C) 98.5 F (36.9 C) (!) 101 F (38.3 C)  TempSrc: Oral Oral Oral Oral  SpO2: 95%  99% 97%  Weight:      Height:        General: Alert, Awake and Oriented to Time, Place and Person. Appear in mild distress Eyes: PERRL, Conjunctiva normal ENT: Oral Mucosa clear moist. Neck: no JVD, no Abnormal Mass Or lumps Cardiovascular: S1 and S2 Present, no Murmur, Respiratory: Bilateral Air entry equal and Decreased, Clear to Auscultation, no Crackles, no wheezes Abdomen: Bowel Sound present, Soft and no tenderness Skin: no redness, no Rash  Extremities: no Pedal edema, no calf tenderness Neurologic: Grossly no focal neuro deficit. Bilaterally Equal motor strength  Data Reviewed: CBC:  Recent Labs Lab 04/01/16 1210 04/03/16 0312 04/04/16 0703 04/05/16 0554  WBC 13.0* 9.4 11.0* 9.8  NEUTROABS  --  6.6 8.0*  --   HGB 11.9* 9.5* 10.6* 10.7*  HCT 36.0 28.8* 31.8* 32.7*  MCV 87.0 87.3 85.9 87.9  PLT 323 296 341 329   Basic Metabolic Panel:  Recent Labs Lab 04/01/16 1210 04/04/16 0703 04/05/16 0554  NA 134* 135 136  K 4.1 3.1* 4.0  CL 100* 100* 101  CO2 '24 26 26  '$ GLUCOSE 327* 163* 203*  BUN '9 9 11  '$ CREATININE 0.64 0.62 0.47  CALCIUM 8.7* 8.5* 8.9    Liver Function Tests:  Recent Labs Lab 04/01/16 1210  AST 22  ALT 16  ALKPHOS 60  BILITOT 1.2  PROT 7.1  ALBUMIN 3.8   No results for input(s): LIPASE, AMYLASE in the last 168 hours. No results for input(s): AMMONIA in the last 168 hours. Coagulation Profile:  Recent Labs Lab 04/01/16 1256  INR 1.05   Cardiac Enzymes:  Recent Labs Lab 04/01/16 1256  TROPONINI <0.03   BNP (last 3 results) No results for input(s): PROBNP in the last 8760 hours.  CBG:  Recent Labs Lab 04/04/16 1729 04/04/16 2144 04/05/16 0803 04/05/16 1156 04/05/16 1728  GLUCAP 179* 208* 218* 255* 214*     Studies: No results found.   Scheduled Meds: . calcium-vitamin D  0.5 tablet Oral Q breakfast  . dexamethasone  4 mg Oral Q8H  . insulin aspart  0-15 Units Subcutaneous TID WC  . insulin aspart  0-5 Units Subcutaneous QHS  . [START ON 04/06/2016] insulin aspart  3 Units Subcutaneous TID WC  . insulin glargine  10 Units Subcutaneous QHS  . lisinopril  5 mg Oral Daily  . metoprolol succinate  25 mg Oral Daily  . mometasone-formoterol  2 puff Inhalation BID  . multivitamin with minerals  1 tablet Oral Daily  . omega-3 acid ethyl esters  1 g Oral Daily  . pantoprazole  40 mg Oral Daily  . simvastatin  20 mg Oral QPM   Continuous Infusions:  PRN Meds: acetaminophen, albuterol, HYDROcodone-acetaminophen, labetalol, LORazepam, ondansetron, senna-docusate  Time spent: 30 minutes  Author: Berle Mull, MD [facility] Pager: [phone] 04/05/2016 7:33 PM  If  7PM-7AM, please contact night-coverage at [email or web address], password [facility]

## 2016-04-05 NOTE — NC FL2 (Signed)
[place] LEVEL OF CARE SCREENING TOOL     IDENTIFICATION  Patient Name: [name] Birthdate: 27-Nov-1950 Sex: female Admission Date (Current Location): 04/01/2016  [facility] and [place] Number:  Herbalist and Address:  [facility],  [address] [place]      Provider Number: [id number]  Attending Physician Name and Address:  Lavina Hamman, MD  Relative Name and Phone Number:       Current Level of Care: Hospital Recommended Level of Care: [place] Prior Approval Number:    Date Approved/Denied:   PASRR Number: [id number]  Discharge Plan: Home    Current Diagnoses: Patient Active Problem List   Diagnosis Date Noted  . Metastatic lung cancer (metastasis from lung to other site) ([place]) 04/01/2016  . Brain metastases ([place]) 04/01/2016  . Bilateral renal masses 04/01/2016  . Falls 04/01/2016  . Confusion 04/01/2016  . Leukocytosis 04/01/2016  . Cerebral brain hemorrhage ([place]) 04/01/2016  . Metastatic primary lung cancer ([place]) 04/01/2016  . Hypertension 06/23/2012  . Snoring 04/12/2012  . Chest pain 04/10/2012  . Obesity 04/10/2012  . Hyperlipidemia 03/02/2011  . Diabetes mellitus type II, non insulin dependent ([place]) 08/01/2010  . ASTHMA UNSPECIFIED WITH EXACERBATION 01/24/2010  . PALPITATIONS 01/24/2010  . ARRHYTHMIA, HX OF 01/24/2010  . HEMORRHOIDS 06/21/2009  . RECTAL BLEEDING 06/21/2009  . Paroxysmal supraventricular tachycardia ([place]) 07/27/2008  . Asthma, mild persistent 07/27/2008  . GERD 07/27/2008    Orientation RESPIRATION BLADDER Height & Weight     Self, Time, Situation, Place  Normal Continent Weight: 179 lb 0.2 oz (81.2 kg) Height:  '5\' 2"'$  (157.5 cm)  BEHAVIORAL SYMPTOMS/MOOD NEUROLOGICAL BOWEL NUTRITION STATUS      Continent Diet (Carb Modified)  AMBULATORY STATUS COMMUNICATION OF NEEDS Skin   Extensive Assist Verbally Normal                       Personal Care  Assistance Level of Assistance  Bathing, Dressing Bathing Assistance: Limited assistance   Dressing Assistance: Limited assistance     Functional Limitations Info             SPECIAL CARE FACTORS FREQUENCY  PT (By licensed PT), OT (By licensed OT)     PT Frequency: 5 OT Frequency: 5            Contractures      Additional Factors Info  Code Status, Allergies Code Status Info: Fullcode Allergies Info: NKDA           Current Medications (04/05/2016):  This is the current hospital active medication list Current Facility-Administered Medications  Medication Dose Route Frequency Provider Last Rate Last Dose  . acetaminophen (TYLENOL) tablet 650 mg  650 mg Oral Q6H PRN Gardiner Barefoot, NP   650 mg at 04/05/16 0146  . albuterol (PROVENTIL) (2.5 MG/3ML) 0.083% nebulizer solution 2.5 mg  2.5 mg Inhalation Q4H PRN Vianne Bulls, MD      . calcium-vitamin D (OSCAL WITH D) 500-200 MG-UNIT per tablet 0.5 tablet  0.5 tablet Oral Q breakfast Vianne Bulls, MD   0.5 tablet at 04/05/16 0927  . HYDROcodone-acetaminophen (NORCO/VICODIN) 5-325 MG per tablet 1 tablet  1 tablet Oral Q4H PRN Vianne Bulls, MD   1 tablet at 04/04/16 2159  . insulin aspart (novoLOG) injection 0-15 Units  0-15 Units Subcutaneous TID WC Lavina Hamman, MD   5 Units at 04/05/16 [phone]  . insulin aspart (  novoLOG) injection 0-5 Units  0-5 Units Subcutaneous QHS Lavina Hamman, MD   2 Units at 04/04/16 2158  . insulin glargine (LANTUS) injection 10 Units  10 Units Subcutaneous QHS Venetia Maxon Rama, MD   10 Units at 04/04/16 2158  . labetalol (NORMODYNE,TRANDATE) injection 5 mg  5 mg Intravenous Q10 min PRN Ilene Qua Opyd, MD      . lisinopril (PRINIVIL,ZESTRIL) tablet 5 mg  5 mg Oral Daily Vianne Bulls, MD   5 mg at 04/05/16 0926  . LORazepam (ATIVAN) tablet 0.5 mg  0.5 mg Oral Q8H PRN Vianne Bulls, MD      . metoprolol succinate (TOPROL-XL) 24 hr tablet 25 mg  25 mg Oral Daily Vianne Bulls, MD   25 mg at  04/05/16 0926  . mometasone-formoterol (DULERA) 200-5 MCG/ACT inhaler 2 puff  2 puff Inhalation BID Vianne Bulls, MD   2 puff at 04/04/16 1951  . multivitamin with minerals tablet 1 tablet  1 tablet Oral Daily Vianne Bulls, MD   1 tablet at 04/05/16 0927  . omega-3 acid ethyl esters (LOVAZA) capsule 1 g  1 g Oral Daily Vianne Bulls, MD   1 g at 04/05/16 8206  . ondansetron (ZOFRAN) injection 4 mg  4 mg Intravenous Q6H PRN Gardiner Barefoot, NP      . pantoprazole (PROTONIX) EC tablet 40 mg  40 mg Oral Daily Vianne Bulls, MD   40 mg at 04/05/16 0926  . senna-docusate (Senokot-S) tablet 1 tablet  1 tablet Oral QHS PRN Vianne Bulls, MD      . simvastatin (ZOCOR) tablet 20 mg  20 mg Oral QPM Vianne Bulls, MD   20 mg at 04/04/16 1734     Discharge Medications: Please see discharge summary for a list of discharge medications.  Relevant Imaging Results:  Relevant Lab Results:   Additional Information SSN: [id number]  Standley Brooking, LCSW

## 2016-04-06 ENCOUNTER — Encounter: Payer: Self-pay | Admitting: Radiation Oncology

## 2016-04-06 ENCOUNTER — Ambulatory Visit
Admit: 2016-04-06 | Discharge: 2016-04-06 | Disposition: A | Discharge status: Home / Self Care | Payer: PPO | Attending: Radiation Oncology | Admitting: Radiation Oncology

## 2016-04-06 VITALS — BP 106/56 | HR 69 | Temp 99.6°F

## 2016-04-06 DIAGNOSIS — C7931 Secondary malignant neoplasm of brain: Secondary | ICD-10-CM

## 2016-04-06 LAB — URINALYSIS W MICROSCOPIC (NOT AT ARMC)
BILIRUBIN URINE: NEGATIVE
GLUCOSE, UA: 500 mg/dL — AB
Ketones, ur: 40 mg/dL — AB
NITRITE: NEGATIVE
PH: 7 (ref 5.0–8.0)
Protein, ur: NEGATIVE mg/dL
SPECIFIC GRAVITY, URINE: 1.025 (ref 1.005–1.030)

## 2016-04-06 LAB — CBC WITH DIFFERENTIAL/PLATELET
BASOS ABS: 0 10*3/uL (ref 0.0–0.1)
BASOS PCT: 0 %
Eosinophils Absolute: 0 10*3/uL (ref 0.0–0.7)
Eosinophils Relative: 0 %
HEMATOCRIT: 32 % — AB (ref 36.0–46.0)
HEMOGLOBIN: 10.4 g/dL — AB (ref 12.0–15.0)
LYMPHS PCT: 7 %
Lymphs Abs: 0.9 10*3/uL (ref 0.7–4.0)
MCH: 27.4 pg (ref 26.0–34.0)
MCHC: 32.5 g/dL (ref 30.0–36.0)
MCV: 84.4 fL (ref 78.0–100.0)
MONO ABS: 0.7 10*3/uL (ref 0.1–1.0)
MONOS PCT: 5 %
NEUTROS ABS: 11.4 10*3/uL — AB (ref 1.7–7.7)
NEUTROS PCT: 88 %
Platelets: 364 10*3/uL (ref 150–400)
RBC: 3.79 MIL/uL — ABNORMAL LOW (ref 3.87–5.11)
RDW: 13 % (ref 11.5–15.5)
WBC: 13 10*3/uL — ABNORMAL HIGH (ref 4.0–10.5)

## 2016-04-06 LAB — COMPREHENSIVE METABOLIC PANEL
ALBUMIN: 3.4 g/dL — AB (ref 3.5–5.0)
ALK PHOS: 63 U/L (ref 38–126)
ALT: 15 U/L (ref 14–54)
AST: 12 U/L — AB (ref 15–41)
Anion gap: 10 (ref 5–15)
BILIRUBIN TOTAL: 1 mg/dL (ref 0.3–1.2)
BUN: 15 mg/dL (ref 6–20)
CALCIUM: 9.3 mg/dL (ref 8.9–10.3)
CO2: 26 mmol/L (ref 22–32)
CREATININE: 0.76 mg/dL (ref 0.44–1.00)
Chloride: 97 mmol/L — ABNORMAL LOW (ref 101–111)
GFR calc Af Amer: >60 mL/min (ref >60)
GLUCOSE: 287 mg/dL — AB (ref 65–99)
POTASSIUM: 4.1 mmol/L (ref 3.5–5.1)
Sodium: 133 mmol/L — ABNORMAL LOW (ref 135–145)
TOTAL PROTEIN: 6.7 g/dL (ref 6.5–8.1)

## 2016-04-06 LAB — GLUCOSE, CAPILLARY
GLUCOSE-CAPILLARY: 264 mg/dL — AB (ref 65–99)
Glucose-Capillary: 282 mg/dL — ABNORMAL HIGH (ref 65–99)
Glucose-Capillary: 272 mg/dL — ABNORMAL HIGH (ref 65–99)
Glucose-Capillary: 302 mg/dL — ABNORMAL HIGH (ref 65–99)

## 2016-04-06 LAB — MAGNESIUM: MAGNESIUM: 1.6 mg/dL — AB (ref 1.7–2.4)

## 2016-04-06 MED ORDER — MAGNESIUM SULFATE 2 GM/50ML IV SOLN
2.0000 g | Freq: Once | INTRAVENOUS | Status: AC
  Administered 2016-04-06: 2 g via INTRAVENOUS
  Filled 2016-04-06: qty 50

## 2016-04-06 MED ORDER — INSULIN GLARGINE 100 UNIT/ML ~~LOC~~ SOLN
15.0000 [IU] | Freq: Every day | SUBCUTANEOUS | Status: DC
  Administered 2016-04-06: 15 [IU] via SUBCUTANEOUS
  Filled 2016-04-06 (×2): qty 0.15

## 2016-04-06 MED ORDER — INSULIN ASPART 100 UNIT/ML ~~LOC~~ SOLN
0.0000 [IU] | Freq: Three times a day (TID) | SUBCUTANEOUS | Status: DC
  Administered 2016-04-06 – 2016-04-07 (×3): 11 [IU] via SUBCUTANEOUS
  Administered 2016-04-07: 7 [IU] via SUBCUTANEOUS

## 2016-04-06 MED ORDER — SONAFINE EX EMUL
1.0000 "application " | Freq: Two times a day (BID) | CUTANEOUS | Status: DC
  Administered 2016-04-06: 1 via TOPICAL

## 2016-04-06 MED ORDER — INSULIN ASPART 100 UNIT/ML ~~LOC~~ SOLN
0.0000 [IU] | Freq: Every day | SUBCUTANEOUS | Status: DC
  Administered 2016-04-06: 4 [IU] via SUBCUTANEOUS

## 2016-04-06 NOTE — Progress Notes (Signed)
Occupational Therapy Treatment Patient Details Name: [name] MRN: [id number] DOB: 07-Jun-1951 Today's Date: 04/06/2016    History of present illness [name] is a 65 y.o. female with medical history significant for type 2 diabetes mellitus, hypertension, GERD, and osteoarthritis who presents the emergency department with one month of intermittent confusion and multiple falls over the last 24 hours.   CT of the head without contrast was obtained and notable for multiple brain hemorrhages and a pattern suggestive of metastatic disease. CT of the abdomen and pelvis was obtained, looking for a source of metastases and a 3.7 cm mass was identified in the medial left upper lobe with adjacent mediastinal invasion suspicious for primary bronchogenic carcinoma. New ill-defined low attenuation masses were also noted in the bilateral kidneys, possibly metastases   OT comments  Pt participated well in OT but fatiqued easily.  Pt with decreased sitting balance today during AROM exercises.  Performed level one theraband from bed level with HOB raised and adjusted length of band for each arm.  Follow Up Recommendations  SNF    Equipment Recommendations  None recommended by OT    Recommendations for Other Services      Precautions / Restrictions Precautions Precautions: Fall Restrictions Weight Bearing Restrictions: No       Mobility Bed Mobility         Supine to sit: Min assist Sit to supine: Min assist   General bed mobility comments: assist for trunk and legs  Transfers                      Balance                                   ADL                                                Vision                     Perception     Praxis      Cognition   Behavior During Therapy: [facility] for tasks assessed/performed                    General Comments: multimodal cues for exercise program    Extremity/Trunk  Assessment               Exercises Other Exercises Other Exercises: sat EOB for AROM of bil UEs.  Pt can move with just slight lag of LUE.  Decreased balance, falling to L.  Performed Level one theraband from bed level with HOB raised:  FF 2 sets of 5 to L and one set of 10 to R.  Also performed 2 sets of 5 horizontal abduction.   Shoulder Instructions       General Comments      Pertinent Vitals/ Pain       Pain Assessment: No/denies pain  Home Living                                          Prior Functioning/Environment  Frequency       Progress Toward Goals  OT Goals(current goals can now be found in the care plan section)  Progress towards OT goals: Progressing toward goals     Plan Discharge plan needs to be updated    Co-evaluation                 End of Session     Activity Tolerance Patient limited by fatigue   Patient Left in bed;with call bell/phone within reach;with bed alarm set   Nurse Communication          Time: 8978-4784 OT Time Calculation (min): 15 min  Charges: OT General Charges $OT Visit: 1 Procedure OT Treatments $Therapeutic Activity: 8-22 mins  Mercedez Boule 04/06/2016, 11:26 AM  Lesle Chris, OTR/L [phone] 04/06/2016

## 2016-04-06 NOTE — Progress Notes (Addendum)
Patient ID: [name], female   DOB: 03/04/1951, 65 y.o.   MRN: [id number] Vital signs are stable I have visited with [name] yesterday evening Clinically she appears to be doing quite reasonably well complaining only of mild headache. She is on Decadron 4 mg 3 times a day. I discussed with her that given the fact that she has a number of lesions in her brain she would not  be a candidate for this surgery for any of these lesions but we would treat him with radiation. This will be pending her final pathology.  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  I'll follow along with you and discuss with radiation oncology whether stereotactic radiosurgery would be an option for her.

## 2016-04-06 NOTE — Progress Notes (Signed)
Inpatient Diabetes Program Recommendations  AACE/ADA: New Consensus Statement on Inpatient Glycemic Control (2015)  Target Ranges:  Prepandial:   less than 140 mg/dL      Peak postprandial:   less than 180 mg/dL (1-2 hours)      Critically ill patients:  140 - 180 mg/dL   Results for [name], [name] (MRN [id number]) as of 04/06/2016 09:53  Ref. Range 04/05/2016 08:03 04/05/2016 11:56 04/05/2016 17:28 04/05/2016 21:43  Glucose-Capillary Latest Ref Range: 65 - 99 mg/dL 218 (H) 255 (H) 214 (H) 250 (H)   Results for [name], [name] (MRN [id number]) as of 04/06/2016 09:53  Ref. Range 04/06/2016 07:52  Glucose-Capillary Latest Ref Range: 65 - 99 mg/dL 282 (H)    Home DM Meds: Glipizide 10 mg bid                              Metformin 850 mg bid  Current Insulin Orders: Lantus 10 units QHS                                       Novolog Moderate Correction Scale/ SSI (0-15 units) TID AC + HS      Novolog 3 units tidwc      MD- Note Decadron 4 mg Q8 hours started yesterday at 1pm.    Also note Novolog 3 units tidwc (meal coverage) started this AM   Please consider increasing Lantus further to 15 units QHS (0.2 units/kg dosing) while patient getting steroids      --Will follow patient during hospitalization--  Wyn Quaker RN, MSN, CDE Diabetes Coordinator Inpatient Glycemic Control Team Team Pager: [phone] (8a-5p)

## 2016-04-06 NOTE — Progress Notes (Signed)
Weekly rad tx 2/10 whole brain, gave sonafine cream and my business card, radiation book, discussed nausea,vomiting, blurry vision, skin irritation, loss hair , fatigue, head aches, , decadon steroid cn cause thrush in mouth/tongue, taste changes, jitteriness,  Brush tonge after meals also, discussed with daughter and patient, patient in w/c on tlemetry, nauseated this am slight fever also,  To apply sonafine cream to scalp daily once skin is irritated or itchy, to increase protein in diet,  May need to eat smaller meals 5-6  And snacks in between, verbal understanding by daughter, will rwead marked pages 9:12 AM BP (!) 106/56 (BP Location: Left Arm, Patient Position: Sitting, Cuff Size: Normal)   Pulse 69   Temp 99.6 F (37.6 C) (Oral)   Wt Readings from Last 3 Encounters:  04/01/16 179 lb 0.2 oz (81.2 kg)  02/23/16 182 lb (82.6 kg)  01/24/16 185 lb (83.9 kg)

## 2016-04-06 NOTE — Progress Notes (Signed)
[facility] Progress Note  Patient: [name] QQI:[id number]   PCP: Robert Bellow, MD DOB: 09-10-1950   DOA: 04/01/2016   DOS: 04/06/2016   Date of Service: the patient was seen and examined on 04/06/2016  Subjective: Patient did spike a temperature last night. Denies any complains of cough, nausea, vomiting, abdominal pain, chest pain, diarrhea, constipation, burning urination or dizziness or lightheadedness. Headache is also improving. Nutrition: Minimal oral intake  Brief hospital course: [name] is an 65 y.o. female with medical history significant for type 2 diabetes mellitus, hypertension, GERD, and osteoarthritis who was admitted 04/01/16 with one month of intermittent confusion and multiple falls over the last 24 hours. CT of the head without contrast was obtained and notable for multiple brain hemorrhages and a pattern suggestive of metastatic disease. CT of the abdomen and pelvis was obtained, looking for a source of metastases and a 3.7 cm mass was identified in the medial left upper lobe with adjacent mediastinal invasion suspicious for primary bronchogenic carcinoma. New ill-defined low attenuation masses were also noted in the bilateral kidneys, possiblymetastases, and MRI of the abdomen showed bilateral kidney lesions as well as a 2 cm upper pole left renal lesion, right paraspinous muscular enhancing lesion and a left high psoas nodule consistent with metastasis. Currently further plan is to monitor the results of the culture and identify any evidence of infection.  Assessment and Plan: 1 Metastatic lung cancer (metastasis from lung to other site) [facility]) Complicated by cerebral brain hemorrhage resulting in confusion, lower extremity weakness and falls/bilateral renal masses 3.7 cm mass noted in medial LUL on CT with invasion into mediastinum. Multiple brain hemorrhages noted on CT, largest of which is 2.8 x 2.5 x 2.6 cm at the genu of Rt internal capsule with  mass effect on the third ventricle and midline shift of 7 mm.  Evaluated by neurosurgeon with recommendations to proceed with tissue diagnosis from other source.  Consulted interventional radiology for CT-guided biopsy of some was present, results suggest poorly differentiated carcinoma. This is likely a primary bronchogenic carcinoma.  Radiation oncology consulted medical oncology as well as neurosurgery.  Patient started on Decadron, I would add scheduled insulin to her regimen.  Active Problems:   Asthma, mild persistent Continue Dulera and when necessary bronchodilators. Stable.    GERD Continue PPI.    Diabetes mellitus type II, non insulin dependent (HCC) Changing sliding scale to resistant, and increasing Lantus to 15 units.    Hypertension Continue lisinopril, metoprolol.    Leukocytosis Resolved, no signs of infection.    Hyperlipidemia Continue Zocor and fish oil supplements.  Fever. When necessary Tylenol. Negative UA, normal chest x-ray, normal pro-calcitonin as well as blood cultures no growth at present.  Pain management: When necessary Tylenol Activity: Consulted physical therapy Bowel regimen: last BM 04/04/2016 Diet: Cardiac diet DVT Prophylaxis: SCD  Advance goals of care discussion: Full code  Family Communication: no family was present at bedside, at the time of interview.   Disposition:  Discharge to SNF. Expected discharge date: 04/08/2016 pending improvement in fever and workup  Consultants: Oncology, radiation oncology, neurosurgery Procedures: CT-guided biopsy  Antibiotics: Anti-infectives    None        Intake/Output Summary (Last 24 hours) at 04/06/16 1627 Last data filed at 04/06/16 1625  Gross per 24 hour  Intake              650 ml  Output  751 ml  Net             -101 ml   Filed Weights   04/01/16 1137 04/01/16 2127  Weight: 80.3 kg (177 lb) 81.2 kg (179 lb 0.2 oz)    Objective: Physical Exam: Vitals:     04/06/16 0456 04/06/16 0637 04/06/16 0952 04/06/16 1317  BP: 124/63  112/72 (!) 98/49  Pulse: 69  63 66  Resp: 20   18  Temp: 100 F (37.8 C) 98.6 F (37 C)  98.5 F (36.9 C)  TempSrc: Oral Oral  Oral  SpO2: 96%   98%  Weight:      Height:        General: Alert, Awake and Oriented to Time, Place and Person. Appear in mild distress Eyes: PERRL, Conjunctiva normal ENT: Oral Mucosa clear moist. Neck: no JVD, no Abnormal Mass Or lumps Cardiovascular: S1 and S2 Present, no Murmur, Respiratory: Bilateral Air entry equal and Decreased, Clear to Auscultation, no Crackles, no wheezes Abdomen: Bowel Sound present, Soft and no tenderness Skin: no redness, no Rash  Extremities: no Pedal edema, no calf tenderness Neurologic: Grossly no focal neuro deficit. Bilaterally Equal motor strength  Data Reviewed: CBC:  Recent Labs Lab 04/01/16 1210 04/03/16 0312 04/04/16 0703 04/05/16 0554 04/06/16 0547  WBC 13.0* 9.4 11.0* 9.8 13.0*  NEUTROABS  --  6.6 8.0*  --  11.4*  HGB 11.9* 9.5* 10.6* 10.7* 10.4*  HCT 36.0 28.8* 31.8* 32.7* 32.0*  MCV 87.0 87.3 85.9 87.9 84.4  PLT 323 296 341 285 211   Basic Metabolic Panel:  Recent Labs Lab 04/01/16 1210 04/04/16 0703 04/05/16 0554 04/06/16 0547  NA 134* 135 136 133*  K 4.1 3.1* 4.0 4.1  CL 100* 100* 101 97*  CO2 '24 26 26 26  '$ GLUCOSE 327* 163* 203* 287*  BUN '9 9 11 15  '$ CREATININE 0.64 0.62 0.47 0.76  CALCIUM 8.7* 8.5* 8.9 9.3  MG  --   --   --  1.6*    Liver Function Tests:  Recent Labs Lab 04/01/16 1210 04/06/16 0547  AST 22 12*  ALT 16 15  ALKPHOS 60 63  BILITOT 1.2 1.0  PROT 7.1 6.7  ALBUMIN 3.8 3.4*   No results for input(s): LIPASE, AMYLASE in the last 168 hours. No results for input(s): AMMONIA in the last 168 hours. Coagulation Profile:  Recent Labs Lab 04/01/16 1256  INR 1.05   Cardiac Enzymes:  Recent Labs Lab 04/01/16 1256  TROPONINI <0.03   BNP (last 3 results) No results for input(s): PROBNP  in the last 8760 hours.  CBG:  Recent Labs Lab 04/05/16 1156 04/05/16 1728 04/05/16 2143 04/06/16 0752 04/06/16 1145  GLUCAP 255* 214* 250* 282* 272*    Studies: Dg Chest Port 1 View  Result Date: 04/05/2016 CLINICAL DATA:  Fever, type II diabetes mellitus, hypertension, intermittent confusion and multiple falls, CNS metastases suspected with LEFT upper lobe mass identified by CT EXAM: PORTABLE CHEST 1 VIEW COMPARISON:  Portable exam 2011 hours compared to 04/01/2016 FINDINGS: Normal heart size, mediastinal contours, and pulmonary vascularity normal. Medial LEFT upper lobe mass identified on prior radiographic and CT exams less well delineated on current study. Minimal LEFT basilar atelectasis. Lungs otherwise clear. No definite pleural effusion or pneumothorax. Bones demineralized. IMPRESSION: LEFT basilar atelectasis. LEFT upper lobe mass poorly visualized on current study. Electronically Signed   By: Lavonia Dana M.D.   On: 04/05/2016 20:48     Scheduled Meds: . calcium-vitamin D  0.5 tablet Oral Q breakfast  . dexamethasone  4 mg Oral Q8H  . insulin aspart  0-20 Units Subcutaneous TID WC  . insulin aspart  0-5 Units Subcutaneous QHS  . insulin glargine  15 Units Subcutaneous QHS  . metoprolol succinate  25 mg Oral Daily  . mometasone-formoterol  2 puff Inhalation BID  . multivitamin with minerals  1 tablet Oral Daily  . omega-3 acid ethyl esters  1 g Oral Daily  . pantoprazole  40 mg Oral Daily  . simvastatin  20 mg Oral QPM   Continuous Infusions:  PRN Meds: acetaminophen, albuterol, HYDROcodone-acetaminophen, LORazepam, ondansetron, senna-docusate  Time spent: 30 minutes  Author: Berle Mull, MD [facility] Pager: [phone] 04/06/2016 4:27 PM  If 7PM-7AM, please contact night-coverage at [email or web address], password [facility]

## 2016-04-06 NOTE — Progress Notes (Signed)
Chaplain follow up for continued emotional support.  Pt's daughter, Barnetta Chapel speaking with SW at chaplain arrival.    Expresses distress / feeling overwhelmed in managing pt's needs around illness and discharge planning, stress within family - particularly that she feels others such as sister are not helping her.  She expresses anger with family around her perception of their inability to assist.    As she is feeling overwhelmed, she longs to have as much information as possible in order to feel more in control and limit feeling blindsided.  Chaplain encouraged making notes and formulating questions for physicians. Spoke about hoping she is "doing a good job" in caring for her mother and chaplain normalized feeling and provided support around her ability to respond to new needs as they arise.  Also provided support around her communication with family and ability to set boundaries for self, identify immediate needs and prioritize.    Barnetta Chapel spoke about goals of care.  Stating, I am not sure if this is a cancer that is "curable" or if we are treating in order to have "quality of life."  She understands care team may not be able to give definite answer on how cancer will respond to treatment, and she states "I'd like to know if we know this is certainly not curable so we can plan for that."   Barnetta Chapel has completed durable power of attorney, but is concerned that a typo in paperwork will prevent this from being valid until it is remedied.  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   Has considerable stress due to finances.  She is concerned about paying copays after 20 days of rehab have elapsed.      Will continue to follow with family for support during admission   [place], [place]  [phone]

## 2016-04-06 NOTE — Progress Notes (Signed)
  Radiation Oncology         [phone] ________________________________  Name: [name] MRN: [id number]  Date: 04/04/2016  DOB: February 11, 1951    Simulation and treatment planning note  DIAGNOSIS:     ICD-9-CM ICD-10-CM   1. Brain metastases ([place]) 198.3 C79.31      The patient presented for simulation for the patient's upcoming course of whole brain radiation treatment. The patient was placed in a supine position and a customized thermoplastic head cast was constructed to aid in patient immobilization during the treatment. This complex treatment device will be used on a daily basis. In this fashion a CT scan was obtained through the head and neck region and isocenter was placed near midline within the brain.  The patient will be planned to receive a course of whole brain radiation treatment to a dose of 30 gray in 10 fractions at 3 gray per fraction. To accomplish this, 2 customized blocks have been designed which corresponds to left and right whole brain radiation fields. These 2 complex treatment devices will be used on a daily basis during the course of radiation. A complex isodose plan is requested to insure that the target area is adequately covered in to facilitate optimization of the treatment plan. A forward planning technique will also be evaluated to determine if this approach significantly improves the plan.   ________________________________   Jodelle Gross, MD, PhD

## 2016-04-06 NOTE — Progress Notes (Signed)
Department of Radiation Oncology  Phone:  [phone] Fax:        [phone]  Weekly Treatment Note    Name: [name] Date: 04/06/2016 MRN: [id number] DOB: 1951-04-21   Diagnosis:     ICD-9-CM ICD-10-CM   1. Brain metastases (HCC) 198.3 C79.31 SONAFINE emulsion 1 application     Current dose: 6 Gy  Current fraction: 2   MEDICATIONS: Current Facility-Administered Medications  Medication Dose Route Frequency Provider Last Rate Last Dose  . SONAFINE emulsion 1 application  1 application Topical BID Hayden Pedro, PA-C   1 application at 59/56/38 7564   No current outpatient prescriptions on file.   Facility-Administered Medications Ordered in Other Encounters  Medication Dose Route Frequency Provider Last Rate Last Dose  . acetaminophen (TYLENOL) tablet 650 mg  650 mg Oral Q6H PRN Gardiner Barefoot, NP   650 mg at 04/06/16 0815  . albuterol (PROVENTIL) (2.5 MG/3ML) 0.083% nebulizer solution 2.5 mg  2.5 mg Inhalation Q4H PRN Vianne Bulls, MD      . calcium-vitamin D (OSCAL WITH D) 500-200 MG-UNIT per tablet 0.5 tablet  0.5 tablet Oral Q breakfast Vianne Bulls, MD   0.5 tablet at 04/06/16 0815  . dexamethasone (DECADRON) tablet 4 mg  4 mg Oral Q8H Hayden Pedro, PA-C   4 mg at 04/06/16 3329  . HYDROcodone-acetaminophen (NORCO/VICODIN) 5-325 MG per tablet 1 tablet  1 tablet Oral Q4H PRN Vianne Bulls, MD   1 tablet at 04/04/16 2159  . insulin aspart (novoLOG) injection 0-20 Units  0-20 Units Subcutaneous TID WC Lavina Hamman, MD      . insulin aspart (novoLOG) injection 0-5 Units  0-5 Units Subcutaneous QHS Lavina Hamman, MD      . insulin glargine (LANTUS) injection 15 Units  15 Units Subcutaneous QHS Lavina Hamman, MD      . labetalol (NORMODYNE,TRANDATE) injection 5 mg  5 mg Intravenous Q10 min PRN Vianne Bulls, MD      . lisinopril (PRINIVIL,ZESTRIL) tablet 5 mg  5 mg Oral Daily Vianne Bulls, MD   5 mg at 04/06/16 0953  . LORazepam  (ATIVAN) tablet 0.5 mg  0.5 mg Oral Q8H PRN Ilene Qua Opyd, MD      . magnesium sulfate IVPB 2 g 50 mL  2 g Intravenous Once Lavina Hamman, MD      . metoprolol succinate (TOPROL-XL) 24 hr tablet 25 mg  25 mg Oral Daily Vianne Bulls, MD   25 mg at 04/06/16 0952  . mometasone-formoterol (DULERA) 200-5 MCG/ACT inhaler 2 puff  2 puff Inhalation BID Vianne Bulls, MD   2 puff at 04/05/16 2230  . multivitamin with minerals tablet 1 tablet  1 tablet Oral Daily Vianne Bulls, MD   1 tablet at 04/06/16 [phone]  . omega-3 acid ethyl esters (LOVAZA) capsule 1 g  1 g Oral Daily Vianne Bulls, MD   1 g at 04/06/16 0953  . ondansetron (ZOFRAN) injection 4 mg  4 mg Intravenous Q6H PRN Gardiner Barefoot, NP   4 mg at 04/05/16 1357  . pantoprazole (PROTONIX) EC tablet 40 mg  40 mg Oral Daily Vianne Bulls, MD   40 mg at 04/06/16 0953  . senna-docusate (Senokot-S) tablet 1 tablet  1 tablet Oral QHS PRN Vianne Bulls, MD      . simvastatin (ZOCOR) tablet 20 mg  20 mg Oral QPM Vianne Bulls, MD  20 mg at 04/05/16 1752     ALLERGIES: Review of patient's allergies indicates no known allergies.   LABORATORY DATA:  Lab Results  Component Value Date   WBC 13.0 (H) 04/06/2016   HGB 10.4 (L) 04/06/2016   HCT 32.0 (L) 04/06/2016   MCV 84.4 04/06/2016   PLT 364 04/06/2016   Lab Results  Component Value Date   NA 133 (L) 04/06/2016   K 4.1 04/06/2016   CL 97 (L) 04/06/2016   CO2 26 04/06/2016   Lab Results  Component Value Date   ALT 15 04/06/2016   AST 12 (L) 04/06/2016   ALKPHOS 63 04/06/2016   BILITOT 1.0 04/06/2016     NARRATIVE: [name] was seen today for weekly treatment management. The chart was checked and the patient's films were reviewed.  Weekly rad tx 2/10 whole brain, gave sonafine cream and my business card, radiation book, discussed nausea,vomiting, blurry vision, skin irritation, loss hair , fatigue, head aches, , decadon steroid cn cause thrush in mouth/tongue, taste  changes, jitteriness,  Brush tonge after meals also, discussed with daughter and patient, patient in w/c on tlemetry, nauseated this am slight fever also,  To apply sonafine cream to scalp daily once skin is irritated or itchy, to increase protein in diet,  May need to eat smaller meals 5-6  And snacks in between, verbal understanding by daughter, will rwead marked pages 11:04 AM BP (!) 106/56 (BP Location: Left Arm, Patient Position: Sitting, Cuff Size: Normal)   Pulse 69   Temp 99.6 F (37.6 C) (Oral)   Wt Readings from Last 3 Encounters:  04/01/16 179 lb 0.2 oz (81.2 kg)  02/23/16 182 lb (82.6 kg)  01/24/16 185 lb (83.9 kg)    PHYSICAL EXAMINATION: oral temperature is 99.6 F (37.6 C). Her blood pressure is 106/56 (abnormal) and her pulse is 69.        ASSESSMENT: The patient is doing satisfactorily with treatment.  PLAN: We will continue with the patient's radiation treatment as planned. We discussed proceeding with palliative radiation treatment to the lung as well. Given the location of the tumor and her overall picture, I believe that this would be helpful. We will proceed with a simulation for treatment planning.

## 2016-04-06 NOTE — Clinical Social Work Placement (Signed)
CSW provided SNF bed offers to patient's daughter, Barnetta Chapel at bedside. Daughter plans to go to [place] to apply for Medicaid today.   **Please call patient's daughter, Barnetta Chapel for any/all changes cell#: [phone] or [phone] **    Raynaldo Opitz, [facility] Clinical Social Worker cell #: [phone]   CLINICAL SOCIAL WORK PLACEMENT  NOTE  Date:  04/06/2016  Patient Details  Name: [name] MRN: [id number] Date of Birth: 1951/05/09  Clinical Social Work is seeking post-discharge placement for this patient at the [facility] level of care (*CSW will initial, date and re-position this form in  chart as items are completed):  Yes   Patient/family provided with [facility] Work Department's list of facilities offering this level of care within the geographic area requested by the patient (or if unable, by the patient's family).  Yes   Patient/family informed of their freedom to choose among providers that offer the needed level of care, that participate in Medicare, Medicaid or managed care program needed by the patient, have an available bed and are willing to accept the patient.  Yes   Patient/family informed of [facility]'s ownership interest in [facility] and [facility], as well as of the fact that they are under no obligation to receive care at these facilities.  PASRR submitted to EDS on 04/06/16     PASRR number received on 04/06/16     Existing PASRR number confirmed on       FL2 transmitted to all facilities in geographic area requested by pt/family on 04/06/16     FL2 transmitted to all facilities within larger geographic area on       Patient informed that his/her managed care company has contracts with or will negotiate with certain facilities, including the following:        Yes   Patient/family informed of bed offers received.  Patient chooses bed at       Physician recommends  and patient chooses bed at      Patient to be transferred to   on  .  Patient to be transferred to facility by       Patient family notified on   of transfer.  Name of family member notified:        PHYSICIAN       Additional Comment:    _______________________________________________ Standley Brooking, LCSW 04/06/2016, 10:38 AM

## 2016-04-06 NOTE — Progress Notes (Signed)
PT Cancellation Note  Patient Details Name: [name] MRN: [id number] DOB: 1951/03/03   Cancelled Treatment:     out of room at Radiation then working with OT on second attempt.  Will check back as schedule permits.   Nathanial Rancher 04/06/2016, 12:30 PM

## 2016-04-07 LAB — PROCALCITONIN: Procalcitonin: <0.1 ng/mL

## 2016-04-07 MED ORDER — DEXAMETHASONE 4 MG PO TABS: 4.0000 mg | ORAL_TABLET | Freq: Three times a day (TID) | ORAL | 0 refills | Status: DC

## 2016-04-07 MED ORDER — LORAZEPAM 0.5 MG PO TABS: 0.5000 mg | ORAL_TABLET | Freq: Three times a day (TID) | ORAL | 0 refills | Status: DC | PRN

## 2016-04-07 MED ORDER — HYDROCODONE-ACETAMINOPHEN 5-325 MG PO TABS: 1.0000 | ORAL_TABLET | Freq: Two times a day (BID) | ORAL | 0 refills | Status: DC | PRN

## 2016-04-07 MED ORDER — INSULIN ASPART 100 UNIT/ML ~~LOC~~ SOLN: 0.0000 [IU] | Freq: Three times a day (TID) | SUBCUTANEOUS | 0 refills | Status: DC

## 2016-04-07 MED ORDER — INSULIN GLARGINE 100 UNIT/ML SOLOSTAR PEN: 15.0000 [IU] | PEN_INJECTOR | Freq: Every day | SUBCUTANEOUS | 0 refills | Status: DC

## 2016-04-07 NOTE — Discharge Summary (Signed)
[facility] Discharge Summary   Patient: [name] JME:[id number]   PCP: Robert Bellow, MD DOB: April 26, 1951   Date of admission: 04/01/2016   Date of discharge:  04/07/2016    Discharge Diagnoses:  Principal Problem:   Metastatic lung cancer (metastasis from lung to other site) [facility]) Active Problems:   Asthma, mild persistent   GERD   Diabetes mellitus type II, non insulin dependent ([place])   Hypertension   Brain metastases (HCC)   Bilateral renal masses   Falls   Confusion   Leukocytosis   Cerebral brain hemorrhage ([place])   Metastatic primary lung cancer ([place])   Admitted From: Home Disposition:  Discharging to SNF  Recommendations for Outpatient Follow-up:  1. Follow-up with PCP in one week. 2. Establish care with Dr. Julien Nordmann in 1 week. 3. Continue radiation 85moe cycles.  Follow-up Information    SRobert Bellow MD. Schedule an appointment as soon as possible for a visit in 1 week(s).   Specialty:  Family Medicine Contact information: [phone]        MEilleen Kempf, MD. Schedule an appointment as soon as possible for a visit in 1 week(s).   Specialty:  Oncology Contact information: [id number] Call on 04/09/2016.   Why:  for radiation times.          Diet recommendation: Carb modified regular diet  Activity: The patient is advised to gradually reintroduce usual activities.  Discharge Condition: good  Code Status: full code  History of present illness: As per the H and P dictated on admission, "SALICESON DOLBOWis a 65y.o. female with medical history significant for type 2 diabetes mellitus, hypertension, GERD, and osteoarthritis who presents the emergency department with one month of intermittent confusion and multiple falls over the last 24 hours. Patient had reportedly been in her usual state of health until  approximately one month ago when her husband began to notice some intermittent confusion. Patient acknowledges this as well. Over the last 24 hours, the patient has had multiple falls, apparently losing her coordination when she bends over or turns quickly. There was no significant injuries reported with these falls. Patient denies any headaches or change in vision or hearing, but acknowledges some mild weakness and numbness in the left upper extremity which is developed insidiously over the past several days. She endorses chronic dyspnea and cough attributed to her asthma, but denies any significant recent worsening. She denies recent fevers, chills, chest pain, palpitations, abdominal pain, or diarrhea. She has been nauseous and vomited once today. She denies a personal history of smoking, but has significant secondhand smoke exposure from her husband. She endorses a family history of lung cancer."  Hospital Course:  Summary of her active problems in the hospital is as following. 1 Metastatic lung cancer (metastasis from lung to other site) ([facility] Complicated by cerebral brain hemorrhage resulting in confusion, lower extremity weakness and falls/bilateral renal masses 3.7 cm mass noted in medial LUL on CT with invasion into mediastinum. Multiple brain hemorrhages noted on CT, largest of which is 2.8 x 2.5 x 2.6 cm at the genu of Rt internal capsule with mass effect on the third ventricle and midline shift of 7 mm. Evaluated by neurosurgeon with recommendations to proceed with tissue diagnosis from other source.  Consulted interventional radiology for CT-guided biopsy of Psoas muscle, results suggest poorly  differentiated carcinoma. This is likely a primary bronchogenic carcinoma.  Radiation oncology consulted medical oncology as well as neurosurgery.  Patient started on Decadron. Denies any headache or focal deficit. Patient has finished 3 session of radiation, we will need 7 more. Patient will also  need follow-up with medical oncology to establish care and discuss further treatment options after radiation.  Active Problems: 2.Asthma, mild persistent Continue Dulera and when necessary bronchodilators. Stable.  3.GERD Continue PPI.  4.Diabetes mellitus type II, non insulin dependent (HCC) Changing sliding scale to resistant, and increasing Lantus to 15 units. Continue to adjust insulin in the facility.  5Hypertension Continue lisinopril, metoprolol.  6Leukocytosis Resolved, no signs of infection.  7Hyperlipidemia Continue Zocor and fish oil supplements.  8  Fever. When necessary Tylenol. Negative UA, normal chest x-ray, normal pro-calcitonin as well as blood cultures no growth at present.  All other chronic medical condition were stable during the hospitalization.  Patient was seen by physical therapy, who recommended SNF, which was arranged by Education officer, museum and case Freight forwarder. On the day of the discharge the patient's vitals were stable and symptoms resolved, and no other acute medical condition were reported by patient. the patient was felt safe to be discharge at SNF with therapy.  Procedures and Results:  Radiation   Consultations:  Neurosurgery  Radiation oncology  DISCHARGE MEDICATION: Current Discharge Medication List    START taking these medications   Details  dexamethasone (DECADRON) 4 MG tablet Take 1 tablet (4 mg total) by mouth every 8 (eight) hours. Qty: 30 tablet, Refills: 0   Associated Diagnoses: Metastatic lung cancer (metastasis from lung to other site), left (HCC)    insulin aspart (NOVOLOG) 100 UNIT/ML injection Inject 0-20 Units into the skin 3 (three) times daily with meals. Qty: 10 mL, Refills: 0    Insulin Glargine (LANTUS SOLOSTAR) 100 UNIT/ML Solostar Pen Inject 15 Units into the skin daily at 10 pm. Qty: 15 mL, Refills: 0      CONTINUE these medications which have NOT CHANGED   Details  acetaminophen (TYLENOL)  500 MG tablet Take 500 mg by mouth every 6 (six) hours as needed for mild pain.     albuterol (PROAIR HFA) 108 (90 BASE) MCG/ACT inhaler Inhale 1 puff into the lungs every 6 (six) hours as needed for wheezing or shortness of breath.    B-Complex TABS Take 1 tablet by mouth daily.    Biotin 5000 MCG CAPS Take 1 capsule by mouth 2 (two) times daily.    budesonide-formoterol (SYMBICORT) 160-4.5 MCG/ACT inhaler Inhale 2 puffs into the lungs 2 (two) times daily.    Calcium-Magnesium-Vitamin D 200-100-33.3 MG-MG-UNIT CAPS Take 1 capsule by mouth daily.    clobetasol (TEMOVATE) 0.05 % cream Apply 1 application topically 2 (two) times daily.     DHA-EPA-Flaxseed Oil-Vitamin E (THERA TEARS NUTRITION PO) Take 1 drop by mouth daily.    glipiZIDE (GLUCOTROL) 10 MG tablet Take 10 mg by mouth 2 (two) times daily.      HYDROcodone-acetaminophen (NORCO/VICODIN) 5-325 MG tablet Take 1 tablet by mouth 2 (two) times daily as needed for moderate pain.     LORazepam (ATIVAN) 0.5 MG tablet Take 0.5 mg by mouth every 8 (eight) hours as needed for anxiety or sleep.     metFORMIN (GLUCOPHAGE) 850 MG tablet Take 850 mg by mouth 2 (two) times daily with a meal.     metoprolol succinate (TOPROL XL) 25 MG 24 hr tablet Take 1 tablet (25 mg) in the am,  and 1/2 tablet (12.5 mg) at bedtime Qty: 135 tablet, Refills: 3    Multiple Vitamin (MULITIVITAMIN WITH MINERALS) TABS Take 1 tablet by mouth daily.    Omega-3 Fatty Acids (FISH OIL) 1000 MG CAPS Take 1 capsule by mouth 2 (two) times daily.     omeprazole (PRILOSEC) 20 MG capsule Take 20 mg by mouth every other day.     simvastatin (ZOCOR) 20 MG tablet Take 20 mg by mouth every evening.    tacrolimus (PROTOPIC) 0.1 % ointment Apply 1 application topically 2 (two) times daily.    Wound Dressings (SONAFINE) Apply 1 application topically daily. Apply after radiation daily on scalp once skin becomes irritated, red,itchy      STOP taking these medications      aspirin EC 81 MG tablet      lisinopril (PRINIVIL,ZESTRIL) 5 MG tablet      naproxen (NAPROSYN) 375 MG tablet      predniSONE (DELTASONE) 10 MG tablet        No Known Allergies Discharge Instructions    Diet - low sodium heart healthy    Complete by:  As directed   Diet Carb Modified    Complete by:  As directed   Increase activity slowly    Complete by:  As directed     Discharge Exam: Filed Weights   04/01/16 1137 04/01/16 2127  Weight: 80.3 kg (177 lb) 81.2 kg (179 lb 0.2 oz)   Vitals:   04/07/16 0903 04/07/16 1254  BP:  110/66  Pulse: 74 73  Resp:  20  Temp:  98.2 F (36.8 C)   General: Appear in no distress, no Rash; Oral Mucosa moist. Cardiovascular: S1 and S2 Present, no Murmur, no JVD Respiratory: Bilateral Air entry present and Clear to Auscultation, no Crackles, no wheezes Abdomen: Bowel Sound present, Soft and no tenderness Extremities: no Pedal edema, no calf tenderness Neurology: Grossly no focal neuro deficit.  The results of significant diagnostics from this hospitalization (including imaging, microbiology, ancillary and laboratory) are listed below for reference.    Significant Diagnostic Studies: Dg Chest 2 View  Result Date: 04/01/2016 CLINICAL DATA:  Chest pain and dizziness. Several falls in last 24 hours. EXAM: CHEST  2 VIEW COMPARISON:  08/26/2015 FINDINGS: The heart size and mediastinal contours are within normal limits. Both lungs are clear. The visualized skeletal structures are unremarkable. IMPRESSION: No active cardiopulmonary disease. Electronically Signed   By: Earle Gell M.D.   On: 04/01/2016 13:38   Ct Head Wo Contrast  Addendum Date: 04/02/2016   ADDENDUM REPORT: 04/02/2016 07:37 ADDENDUM: Study discussed by telephone with Dr. Kristeen Miss on 04/02/2016 at 0720 hours. We discussed that the multiple (at least 6) hyperdense lesions scattered in the brain are very round and discretely marginated. These are most compatible with brain masses.  Their hyperdensity could beyond the basis of hypercellularity, or possibly some internal hemorrhage. However, there is no bona fide acute intracranial hemorrhage. There is edema surrounding the largest lesion measuring 28 mm which is centered at the right deep gray matter, and mild associated regional mass effect. CONCLUSION 1. Multiple hyperdense brain metastases. See also CT Chest Abdomen and Pelvis from the same day. 2. No acute intracranial hemorrhage. Electronically Signed   By: Genevie Ann M.D.   On: 04/02/2016 07:37   Result Date: 04/02/2016 CLINICAL DATA:  Reported pt with multiple falls in last 24 hours and started calling people in middle of night. "feels like screaming" and wants people to do  want she wants. Hx dm. EXAM: CT HEAD WITHOUT CONTRAST TECHNIQUE: Contiguous axial images were obtained from the base of the skull through the vertex without intravenous contrast. COMPARISON:  None. FINDINGS: There are multiple brain hemorrhages. In the right base a ganglia centered in the region of the genu of the right internal capsule, there is a large round hemorrhage measuring 2.8 x 2.5 x 2.6 cm. This has mass effect mostly facing the third ventricle and shifting the midline structures to the left by 7 mm. There is surrounding vasogenic edema. There are multiple small hemorrhages along the corticomedullary junctions. There is a hemorrhage in the anterior left temporal lobe another smaller hemorrhage in the posterior left occipital lobe another in the right frontal lobe, 1 in the right cerebellum and a smaller along the peripheral left cerebellum. Another area of hemorrhage lies along the inferior margin of the temporal horn of the right lateral ventricle in the right temporal lobe. There is mild prominence of the lateral ventricles, most notably the atria and temporal horns. This suggests a mild degree of obstructive hydrocephalus. There is mild patchy white matter hypoattenuation consistent with chronic  microvascular ischemic change. There is no evidence of an ischemic infarct. There are no extra-axial masses or abnormal fluid collections. No skull lesion. The visualized sinuses and mastoid air cells are clear. IMPRESSION: 1. Multiple brain hemorrhages. Given the pattern, hemorrhagic metastatic disease is suspected. Alternatively, the large right base a ganglia hemorrhage may be a hypertensive hemorrhage. Other corticomedullary junction hemorrhages could potentially be traumatic in origin. 2. Recommend nonemergent follow-up chest, abdomen and pelvis CT to assess for neoplastic disease. Electronically Signed: By: Lajean Manes M.D. On: 04/01/2016 14:08   Ct Chest W Contrast  Result Date: 04/01/2016 CLINICAL DATA:  Multiple falls. Hemorrhagic brain metastases seen on recent CT. Diabetes. EXAM: CT CHEST, ABDOMEN, AND PELVIS WITH CONTRAST TECHNIQUE: Multidetector CT imaging of the chest, abdomen and pelvis was performed following the standard protocol during bolus administration of intravenous contrast. CONTRAST:  124m ISOVUE-300 IOPAMIDOL (ISOVUE-300) INJECTION 61% COMPARISON:  AP CT on 07/25/2005 FINDINGS: CT CHEST FINDINGS Cardiovascular: Normal heart size. Mild LAD coronary artery calcification seen. Aortic atherosclerosis noted. Mediastinum/Lymph Nodes: No pathologically enlarged lymph nodes identified. Lungs/Pleura: An irregular pulmonary masses seen in the medial left upper lobe which shows invasion of adjacent mediastinal fat. This measures 3.7 x 2.9 cm on image 13/2. This is suspicious for primary bronchogenic carcinoma. 5 mm perifissural nodule in the right lung along the minor fissure on image 72/series 7 is most consistent with a benign intrapulmonary lymph node. No evidence of pleural effusion. Musculoskeletal: No chest wall mass or suspicious bone lesions identified. CT ABDOMEN PELVIS FINDINGS Hepatobiliary: Moderate hepatic steatosis is seen but decreased since previous study. No liver masses  identified. Prior cholecystectomy noted. No evidence of biliary dilatation. Pancreas: No mass, inflammatory changes, or other significant abnormality. Spleen: Within normal limits in size and appearance. Adrenals/Urinary Tract: Normal adrenal glands. 2.5 cm low-attenuation lesion is seen in the upper pole left kidney on image 20/8, and low-attenuation lesion measuring 2.2 cm is seen in the upper pole of the right kidney on image 23/8. These are new since previous study, are ill-defined, and do not meet criteria for benign cysts. Differential diagnosis includes renal metastases, renal cell carcinomas, and renal abscesses. No evidence of hydronephrosis. Unopacified urinary bladder is unremarkable in appearance. Stomach/Bowel: No evidence of obstruction, inflammatory process, or abnormal fluid collections. Vascular/Lymphatic: No pathologically enlarged lymph nodes. No evidence of abdominal aortic  aneurysm. Aortic atherosclerosis noted. Reproductive: No mass or other significant abnormality. Other: None. Musculoskeletal:  No suspicious bone lesions identified. IMPRESSION: 3.7 cm mass in medial left upper lobe with adjacent mediastinal invasion. This is highly suspicious for primary bronchogenic carcinoma. No evidence of thoracic lymphadenopathy or pleural effusion. New ill-defined low-attenuation masses in both kidneys measuring 2-3 cm. Differential diagnosis includes renal metastases, primary renal cell carcinomas, and renal abscesses. Consider abdomen MRI without and with contrast for further characterization. No other sites of metastatic disease identified within the abdomen or pelvis. Decreased hepatic steatosis since prior study. Aortic atherosclerosis and coronary artery calcification. Electronically Signed   By: Earle Gell M.D.   On: 04/01/2016 17:19   Mr Jeri Cos YY Contrast  Result Date: 04/04/2016 CLINICAL DATA:  65 y/o F; lung cancer with brain metastasis, etc starting patient. Increasing confusion. EXAM:  MRI HEAD WITHOUT AND WITH CONTRAST TECHNIQUE: Multiplanar, multiecho pulse sequences of the brain and surrounding structures were obtained without and with intravenous contrast. CONTRAST:  22m MULTIHANCE GADOBENATE DIMEGLUMINE 529 MG/ML IV SOLN COMPARISON:  CT of the head dated 04/01/2016. FINDINGS: Brain: There are multiple foci (12-15) of susceptibility hypointensity compatible with hemorrhage throughout the supratentorial and infratentorial brain, the left thalamus, and within the right basal ganglia. The hyperdense lesions on the prior CT of the head have corresponding susceptibility foci and are consistent with acute hemorrhage. There are additional foci of susceptibility not appreciable on CT which may represent pole hemorrhage for example in the left thalamus and right lateral cerebellar hemisphere. The postcontrast images are severely motion degraded with a suboptimal assessment for enhancement. The larger lesions demonstrate T2 FLAIR hyperintensity likely representing vasogenic edema. The largest lesion in the right basal ganglia measures approximately 30 x 32 mm axially (series 8, image 14) and exerts mass effect with partial effacement of the third ventricle and right lateral ventricle and a 6 mm of right-to-left midline shift of septum pellucidum. Extra-axial space: Small diameter hemorrhage pooling within the occipital horns of the lateral ventricles. No hydrocephalus. No additional extra-axial collection is identified. Other: No abnormal signal of the paranasal sinuses. No abnormal signal of the mastoid air cells. Orbits are unremarkable. Calvarium is unremarkable. IMPRESSION: 1. Multiple foci (12-15) of susceptibility hypointensity throughout the supra and infratentorial brain as well as the basal ganglia consistent with hemorrhage. Postcontrast images are severely motion degraded with a suboptimal assessment for enhancement. Some foci of susceptibility do not have a CT correlate and may represent  older hemorrhage. Findings probably represent hemorrhagic metastatic disease. Some foci may be related to hypertension. Consider repeat postcontrast images with sedation when patient is able. 2. Vasogenic edema and mass effect associated with the largest right basal ganglia lesion partially effaces the third and right lateral ventricle and results in stable 6 mm of right to left midline shift. Stable small volume of intraventricular hemorrhage. Electronically Signed   By: LKristine GarbeM.D.   On: 04/04/2016 01:13   Mr Abdomen W Wo Contrast  Result Date: 04/02/2016 CLINICAL DATA:  Renal lesions on CT. Hemorrhagic brain metastasis. Multiple falls. EXAM: MRI ABDOMEN WITHOUT AND WITH CONTRAST TECHNIQUE: Multiplanar multisequence MR imaging of the abdomen was performed both before and after the administration of intravenous contrast. CONTRAST:  155mMULTIHANCE GADOBENATE DIMEGLUMINE 529 MG/ML IV SOLN COMPARISON:  Abdominal CT of 04/01/2016 FINDINGS: Mild to moderate motion degradation throughout. Lower chest: Normal heart size without pericardial or pleural effusion. Hepatobiliary: Moderate hepatic steatosis. No focal liver lesion. Cholecystectomy, without biliary ductal dilatation.  Pancreas: Normal, without mass or ductal dilatation. Spleen: Normal in size, without focal abnormality. Adrenals/Urinary Tract: Normal adrenal glands. Too small to characterize lesions in both kidneys. 1.3 cm upper pole right renal lesion is T2 hypo intense and demonstrates mild post-contrast enhancement, including on image 56/ series 1003. A 2.0 cm upper pole left renal lesion is also T2 hypointense and demonstrates mild post-contrast enhancement, including on image 46/ series 1005. No hydronephrosis. Stomach/Bowel: Normal stomach and abdominal bowel loops. Vascular/Lymphatic: Normal caliber of the aorta and branch vessels. Patent renal veins. No retroperitoneal or retrocrural adenopathy. Other: No ascites. Musculoskeletal:  Right paraspinous muscular enhancing lesion measures 1.3 cm on image 15/series 4. A left high psoas enhancing nodule measures 1.8 cm on image 51/ series 1002. IMPRESSION: 1. Enhancing intramuscular lesions are most consistent with metastasis, presumably from the patient's primary lung cancer. 2. Bilateral hypoenhancing renal lesions with precontrast T2 hypointensity. Favor metastasis. Differential considerations include multiple papillary type renal cell carcinomas versus less likely lipid poor angiomyolipomas. 3. Motion degraded exam. 4. Hepatic steatosis. Electronically Signed   By: Abigail Miyamoto M.D.   On: 04/02/2016 08:47   Ct Abdomen Pelvis W Contrast  Result Date: 04/01/2016 CLINICAL DATA:  Multiple falls. Hemorrhagic brain metastases seen on recent CT. Diabetes. EXAM: CT CHEST, ABDOMEN, AND PELVIS WITH CONTRAST TECHNIQUE: Multidetector CT imaging of the chest, abdomen and pelvis was performed following the standard protocol during bolus administration of intravenous contrast. CONTRAST:  135m ISOVUE-300 IOPAMIDOL (ISOVUE-300) INJECTION 61% COMPARISON:  AP CT on 07/25/2005 FINDINGS: CT CHEST FINDINGS Cardiovascular: Normal heart size. Mild LAD coronary artery calcification seen. Aortic atherosclerosis noted. Mediastinum/Lymph Nodes: No pathologically enlarged lymph nodes identified. Lungs/Pleura: An irregular pulmonary masses seen in the medial left upper lobe which shows invasion of adjacent mediastinal fat. This measures 3.7 x 2.9 cm on image 13/2. This is suspicious for primary bronchogenic carcinoma. 5 mm perifissural nodule in the right lung along the minor fissure on image 72/series 7 is most consistent with a benign intrapulmonary lymph node. No evidence of pleural effusion. Musculoskeletal: No chest wall mass or suspicious bone lesions identified. CT ABDOMEN PELVIS FINDINGS Hepatobiliary: Moderate hepatic steatosis is seen but decreased since previous study. No liver masses identified. Prior  cholecystectomy noted. No evidence of biliary dilatation. Pancreas: No mass, inflammatory changes, or other significant abnormality. Spleen: Within normal limits in size and appearance. Adrenals/Urinary Tract: Normal adrenal glands. 2.5 cm low-attenuation lesion is seen in the upper pole left kidney on image 20/8, and low-attenuation lesion measuring 2.2 cm is seen in the upper pole of the right kidney on image 23/8. These are new since previous study, are ill-defined, and do not meet criteria for benign cysts. Differential diagnosis includes renal metastases, renal cell carcinomas, and renal abscesses. No evidence of hydronephrosis. Unopacified urinary bladder is unremarkable in appearance. Stomach/Bowel: No evidence of obstruction, inflammatory process, or abnormal fluid collections. Vascular/Lymphatic: No pathologically enlarged lymph nodes. No evidence of abdominal aortic aneurysm. Aortic atherosclerosis noted. Reproductive: No mass or other significant abnormality. Other: None. Musculoskeletal:  No suspicious bone lesions identified. IMPRESSION: 3.7 cm mass in medial left upper lobe with adjacent mediastinal invasion. This is highly suspicious for primary bronchogenic carcinoma. No evidence of thoracic lymphadenopathy or pleural effusion. New ill-defined low-attenuation masses in both kidneys measuring 2-3 cm. Differential diagnosis includes renal metastases, primary renal cell carcinomas, and renal abscesses. Consider abdomen MRI without and with contrast for further characterization. No other sites of metastatic disease identified within the abdomen or pelvis. Decreased hepatic  steatosis since prior study. Aortic atherosclerosis and coronary artery calcification. Electronically Signed   By: Earle Gell M.D.   On: 04/01/2016 17:19   Ct Biopsy  Result Date: 04/03/2016 INDICATION: 65 year old female with a history of metastatic carcinoma. MR demonstrates lesion of the left psoas muscle. She has been referred  for biopsy. EXAM: CT BIOPSY MEDICATIONS: None. ANESTHESIA/SEDATION: Moderate (conscious) sedation was employed during this procedure. A total of Versed 2.0 mg and Fentanyl 50 mcg was administered intravenously. Moderate Sedation Time: 12 minutes. The patient's level of consciousness and vital signs were monitored continuously by radiology nursing throughout the procedure under my direct supervision. FLUOROSCOPY TIME:  CT COMPLICATIONS: None PROCEDURE: Informed written consent was obtained from the patient after a thorough discussion of the procedural risks, benefits and alternatives. All questions were addressed. Maximal Sterile Barrier Technique was utilized including caps, mask, sterile gowns, sterile gloves, sterile drape, hand hygiene and skin antiseptic. A timeout was performed prior to the initiation of the procedure. Patient position prone position on CT gantry table. Scout CT was performed of the psoas musculature. The patient is then prepped and draped in the usual sterile fashion. The skin and subcutaneous tissues were generously infiltrated with 1% lidocaine for local anesthesia. A small stab incision was made with an 11 blade scalpel. Using CT guidance, 17 gauge guide needle was advanced into the targeted contour abnormality of the left psoas muscle. Multiple 18 gauge core biopsy were achieved. Patient tolerated the procedure well and remained hemodynamically stable throughout. No complications were encountered and no significant blood loss encountered. IMPRESSION: Status post CT-guided biopsy of left psoas lesion, targeting contour abnormality of the psoas muscle. Tissue specimen sent to pathology for complete histopathologic analysis. Signed, Dulcy Fanny. Earleen Newport, DO [facility] Radiology Electronically Signed   By: Corrie Mckusick D.O.   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 Medial LEFT upper lobe mass identified on prior radiographic and CT exams less well delineated on current study. Minimal LEFT basilar atelectasis. Lungs otherwise clear. No definite pleural effusion or pneumothorax. Bones demineralized. IMPRESSION: LEFT basilar atelectasis. LEFT upper lobe mass poorly visualized on current study. Electronically Signed   By: Lavonia Dana M.D.   On: 04/05/2016 20:48    Microbiology: Recent Results (from the past 240 hour(s))  MRSA PCR Screening     Status: None   Collection Time: 04/01/16 10:40 PM  Result Value Ref Range Status   MRSA by PCR NEGATIVE NEGATIVE Final    Comment:        The GeneXpert MRSA Assay (FDA approved for NASAL specimens only), is one component of a comprehensive MRSA colonization surveillance program. It is not intended to diagnose MRSA infection nor to guide or monitor treatment for MRSA infections.   Culture, blood (routine x 2)     Status: None (Preliminary result)   Collection Time: 04/05/16  7:51 PM  Result Value Ref Range Status   Specimen Description BLOOD LEFT HAND  Final   Special Requests BOTTLES DRAWN AEROBIC AND ANAEROBIC 10CC  Final   Culture   Final    NO GROWTH 2 DAYS Performed at [facility]  Report Status PENDING  Incomplete  Culture, blood (routine x 2)     Status: None (Preliminary result)   Collection Time: 04/05/16  8:01 PM  Result Value Ref Range Status   Specimen Description BLOOD RIGHT HAND  Final   Special Requests BOTTLES DRAWN AEROBIC AND ANAEROBIC 10CC  Final   Culture   Final    NO GROWTH 2 DAYS Performed at [facility]    Report Status PENDING  Incomplete     Labs: CBC:  Recent  Labs Lab 04/01/16 1210 04/03/16 0312 04/04/16 0703 04/05/16 0554 04/06/16 0547  WBC 13.0* 9.4 11.0* 9.8 13.0*  NEUTROABS  --  6.6 8.0*  --  11.4*  HGB 11.9* 9.5* 10.6* 10.7* 10.4*  HCT 36.0 28.8* 31.8* 32.7* 32.0*  MCV 87.0 87.3 85.9 87.9 84.4  PLT 323 296 341 285 124   Basic Metabolic Panel:  Recent Labs Lab 04/01/16 1210 04/04/16 0703 04/05/16 0554 04/06/16 0547  NA 134* 135 136 133*  K 4.1 3.1* 4.0 4.1  CL 100* 100* 101 97*  CO2 '24 26 26 26  '$ GLUCOSE 327* 163* 203* 287*  BUN '9 9 11 15  '$ CREATININE 0.64 0.62 0.47 0.76  CALCIUM 8.7* 8.5* 8.9 9.3  MG  --   --   --  1.6*   Liver Function Tests:  Recent Labs Lab 04/01/16 1210 04/06/16 0547  AST 22 12*  ALT 16 15  ALKPHOS 60 63  BILITOT 1.2 1.0  PROT 7.1 6.7  ALBUMIN 3.8 3.4*   No results for input(s): LIPASE, AMYLASE in the last 168 hours. No results for input(s): AMMONIA in the last 168 hours. Cardiac Enzymes:  Recent Labs Lab 04/01/16 1256  TROPONINI <0.03   BNP (last 3 results) No results for input(s): BNP in the last 8760 hours. CBG:  Recent Labs Lab 04/05/16 2143 04/06/16 0752 04/06/16 1145 04/06/16 1717 04/06/16 2205  GLUCAP 250* 282* 272* 264* 302*   Time spent: 30 minutes  Signed:  Detra Bores  [facility]  04/07/2016  , 1:43 PM

## 2016-04-07 NOTE — Progress Notes (Signed)
Report called to rhonda at [facility]. Brigitte Pulse, RN

## 2016-04-07 NOTE — Progress Notes (Signed)
Chart reviewed. No NCM needs identified. Jonnie Finner RN CCM

## 2016-04-07 NOTE — Clinical Social Work Placement (Signed)
Patient is set to discharge to [facility] today. Patient & daughter, [name] aware. Discharge packet given to RN, Jerene Pitch. [facility] called for transport.     Raynaldo Opitz, [facility] Clinical Social Worker cell #: [phone]    CLINICAL SOCIAL WORK PLACEMENT  NOTE  Date:  04/07/2016  Patient Details  Name: [name] MRN: [id number] Date of Birth: [birth year]  Clinical Social Work is seeking post-discharge placement for this patient at the [place] level of care (*CSW will initial, date and re-position this form in  chart as items are completed):  Yes   Patient/family provided with [place] Work Department's list of facilities offering this level of care within the geographic area requested by the patient (or if unable, by the patient's family).  Yes   Patient/family informed of their freedom to choose among providers that offer the needed level of care, that participate in Medicare, Medicaid or managed care program needed by the patient, have an available bed and are willing to accept the patient.  Yes   Patient/family informed of [facility]'s ownership interest in [facility] and [facility], as well as of the fact that they are under no obligation to receive care at these facilities.  PASRR submitted to EDS on 04/06/16     PASRR number received on 04/06/16     Existing PASRR number confirmed on       FL2 transmitted to all facilities in geographic area requested by pt/family on 04/06/16     FL2 transmitted to all facilities within larger geographic area on       Patient informed that his/her managed care company has contracts with or will negotiate with certain facilities, including the following:        Yes   Patient/family informed of bed offers received.  Patient chooses bed at [facility]     Physician recommends and patient chooses bed at      Patient to be transferred  to [facility] on 04/07/16.  Patient to be transferred to facility by [facility]     Patient family notified on 04/07/16 of transfer.  Name of family member notified:  patient's daughter, [name] at bedside     PHYSICIAN       Additional Comment:    _______________________________________________ Standley Brooking, LCSW 04/07/2016, 2:37 PM

## 2016-04-09 ENCOUNTER — Ambulatory Visit
Admission: RE | Admit: 2016-04-09 | Discharge: 2016-04-09 | Disposition: A | Discharge status: Home / Self Care | Payer: PPO | Source: Ambulatory Visit | Attending: Radiation Oncology | Admitting: Radiation Oncology

## 2016-04-09 ENCOUNTER — Telehealth: Payer: Self-pay | Admitting: Medical Oncology

## 2016-04-09 ENCOUNTER — Encounter: Payer: Self-pay | Admitting: *Deleted

## 2016-04-09 ENCOUNTER — Telehealth: Payer: Self-pay | Admitting: *Deleted

## 2016-04-09 ENCOUNTER — Ambulatory Visit: Payer: PPO | Admitting: Radiation Oncology

## 2016-04-09 ENCOUNTER — Ambulatory Visit: Payer: PPO

## 2016-04-09 DIAGNOSIS — C3412 Malignant neoplasm of upper lobe, left bronchus or lung: Secondary | ICD-10-CM

## 2016-04-09 DIAGNOSIS — C3490 Malignant neoplasm of unspecified part of unspecified bronchus or lung: Secondary | ICD-10-CM | POA: Diagnosis not present

## 2016-04-09 DIAGNOSIS — Z510 Encounter for antineoplastic radiation therapy: Secondary | ICD-10-CM | POA: Diagnosis not present

## 2016-04-09 DIAGNOSIS — C7931 Secondary malignant neoplasm of brain: Secondary | ICD-10-CM | POA: Diagnosis not present

## 2016-04-09 LAB — GLUCOSE, CAPILLARY
GLUCOSE-CAPILLARY: 266 mg/dL — AB (ref 65–99)
Glucose-Capillary: 236 mg/dL — ABNORMAL HIGH (ref 65–99)

## 2016-04-09 NOTE — Progress Notes (Signed)
Faxed rx for dexamathasone to decrease to '4mg'$  bid starting 04/11/16  At Fax# 3:43 PM [phone]

## 2016-04-09 NOTE — Telephone Encounter (Signed)
Called [facility] at [phone] spoke with [facility] LPN,  " To decrease dexamethasone from '4mg'$  tid to '4mg'$  tablet bid starting August 23/2017  Per  Marcelline Mates, PA, Rhonda asked [place] to fax the order to [facility] [phone]  2:58 PM

## 2016-04-09 NOTE — Telephone Encounter (Signed)
Asking for appt with [facility]. Note to mohamed.

## 2016-04-09 NOTE — Progress Notes (Signed)
Oncology Nurse Navigator Documentation  Oncology Nurse Navigator Flowsheets 04/09/2016  Navigator Encounter Type Other/I updated Dr. Julien Nordmann patient on patient's pathology.  He requested foundation one and PDL 1 to be completed.  I notified pathology dept on request.  Patient is getting whole brain radiation and Dr. Julien Nordmann would like to see patient after this is completed.  I will updated new patient coordinator to set up an appt.   Confirmed Diagnosis Date 04/06/2016  Treatment Phase Pre-Tx/Tx Discussion  Barriers/Navigation Needs Coordination of Care  Interventions Coordination of Care  Coordination of Care Appts;Other  Acuity Level 2  Acuity Level 2 Assistance expediting appointments;Other  Time Spent with Patient 30

## 2016-04-10 ENCOUNTER — Ambulatory Visit
Admission: RE | Admit: 2016-04-10 | Discharge: 2016-04-10 | Disposition: A | Discharge status: Home / Self Care | Payer: PPO | Source: Ambulatory Visit | Attending: Radiation Oncology | Admitting: Radiation Oncology

## 2016-04-10 DIAGNOSIS — Z510 Encounter for antineoplastic radiation therapy: Secondary | ICD-10-CM | POA: Diagnosis not present

## 2016-04-10 LAB — CULTURE, BLOOD (ROUTINE X 2)
CULTURE: NO GROWTH
Culture: NO GROWTH

## 2016-04-11 ENCOUNTER — Ambulatory Visit
Admission: RE | Admit: 2016-04-11 | Discharge: 2016-04-11 | Disposition: A | Discharge status: Home / Self Care | Payer: PPO | Source: Ambulatory Visit | Attending: Radiation Oncology | Admitting: Radiation Oncology

## 2016-04-11 ENCOUNTER — Telehealth: Payer: Self-pay | Admitting: *Deleted

## 2016-04-11 ENCOUNTER — Telehealth: Payer: Self-pay | Admitting: Internal Medicine

## 2016-04-11 ENCOUNTER — Ambulatory Visit: Payer: PPO | Admitting: Radiation Oncology

## 2016-04-11 DIAGNOSIS — Z510 Encounter for antineoplastic radiation therapy: Secondary | ICD-10-CM | POA: Diagnosis not present

## 2016-04-11 NOTE — Telephone Encounter (Signed)
Called [facility], attempt to reach pt regarding upcoming appt with MD. Phone disconnected after ringing with no answer.

## 2016-04-11 NOTE — Telephone Encounter (Signed)
CONFIRMED 9/12 APPOINTMENT @ 2:15 PM WITH SERITA AT [facility]. I CALLED AND SPOKE WITH SERTIA RE APPOINTMENTS FOR ANOTHER MM PATIENT AND SHE REQUESTED AN APPOINTMENT FOR MS Schiano WHO IS ALREADY ON SCHEDULE FOR 9/12. APPOINTMENT CONFIRMED.

## 2016-04-12 ENCOUNTER — Ambulatory Visit
Admission: RE | Admit: 2016-04-12 | Discharge: 2016-04-12 | Disposition: A | Discharge status: Home / Self Care | Payer: PPO | Source: Ambulatory Visit | Attending: Radiation Oncology | Admitting: Radiation Oncology

## 2016-04-12 DIAGNOSIS — Z510 Encounter for antineoplastic radiation therapy: Secondary | ICD-10-CM | POA: Diagnosis not present

## 2016-04-13 ENCOUNTER — Ambulatory Visit
Admission: RE | Admit: 2016-04-13 | Discharge: 2016-04-13 | Disposition: A | Discharge status: Home / Self Care | Payer: PPO | Source: Ambulatory Visit | Attending: Radiation Oncology | Admitting: Radiation Oncology

## 2016-04-13 ENCOUNTER — Encounter: Payer: Self-pay | Admitting: Radiation Oncology

## 2016-04-13 VITALS — BP 110/64 | HR 76 | Temp 98.0°F | Ht 62.0 in | Wt 167.4 lb

## 2016-04-13 DIAGNOSIS — Z510 Encounter for antineoplastic radiation therapy: Secondary | ICD-10-CM | POA: Diagnosis not present

## 2016-04-13 DIAGNOSIS — C7931 Secondary malignant neoplasm of brain: Secondary | ICD-10-CM

## 2016-04-13 DIAGNOSIS — C3492 Malignant neoplasm of unspecified part of left bronchus or lung: Secondary | ICD-10-CM

## 2016-04-13 MED ORDER — PROCHLORPERAZINE MALEATE 10 MG PO TABS: 10.0000 mg | ORAL_TABLET | Freq: Four times a day (QID) | ORAL | 0 refills | Status: AC | PRN

## 2016-04-13 MED ORDER — SONAFINE EX EMUL
1.0000 "application " | Freq: Once | CUTANEOUS | Status: AC
  Administered 2016-04-13: 1 via TOPICAL

## 2016-04-13 NOTE — Progress Notes (Signed)
Department of Radiation Oncology  Phone:  [phone] Fax:        [phone]  Weekly Treatment Note    Name: [name] Date: 04/15/2016 MRN: [id number] DOB: [birth year]   Diagnosis:     ICD-9-CM ICD-10-CM   1. Metastatic lung cancer (metastasis from lung to other site), left (HCC) 162.9 C34.92 SONAFINE emulsion 1 application  2. Brain metastases (HCC) 198.3 C79.31 SONAFINE emulsion 1 application     Current dose: 21 Gy  Current fraction: 7   MEDICATIONS: Current Outpatient Prescriptions  Medication Sig Dispense Refill  . albuterol (PROAIR HFA) 108 (90 BASE) MCG/ACT inhaler Inhale 1 puff into the lungs every 6 (six) hours as needed for wheezing or shortness of breath.    . B-Complex TABS Take 1 tablet by mouth daily.    . Biotin 5000 MCG CAPS Take 1 capsule by mouth 2 (two) times daily.    . budesonide-formoterol (SYMBICORT) 160-4.5 MCG/ACT inhaler Inhale 2 puffs into the lungs 2 (two) times daily.    . Calcium-Magnesium-Vitamin D 200-100-33.3 MG-MG-UNIT CAPS Take 1 capsule by mouth daily.    . clobetasol (TEMOVATE) 0.05 % cream Apply 1 application topically 2 (two) times daily.     Marland Kitchen dexamethasone (DECADRON) 4 MG tablet Take 1 tablet (4 mg total) by mouth every 8 (eight) hours. 30 tablet 0  . DHA-EPA-Flaxseed Oil-Vitamin E (THERA TEARS NUTRITION PO) Take 1 drop by mouth daily.    Marland Kitchen glipiZIDE (GLUCOTROL) 10 MG tablet Take 10 mg by mouth 2 (two) times daily.      Marland Kitchen HYDROcodone-acetaminophen (NORCO/VICODIN) 5-325 MG tablet Take 1 tablet by mouth 2 (two) times daily as needed for moderate pain. 5 tablet 0  . insulin aspart (NOVOLOG) 100 UNIT/ML injection Inject 0-20 Units into the skin 3 (three) times daily with meals. 10 mL 0  . Insulin Glargine (LANTUS SOLOSTAR) 100 UNIT/ML Solostar Pen Inject 15 Units into the skin daily at 10 pm. 15 mL 0  . LORazepam (ATIVAN) 0.5 MG tablet Take 1 tablet (0.5 mg total) by mouth every 8 (eight) hours as needed for anxiety or sleep. 5  tablet 0  . metFORMIN (GLUCOPHAGE) 850 MG tablet Take 850 mg by mouth 2 (two) times daily with a meal.     . metoprolol succinate (TOPROL XL) 25 MG 24 hr tablet Take 1 tablet (25 mg) in the am, and 1/2 tablet (12.5 mg) at bedtime (Patient taking differently: Take 25 mg by mouth daily. ) 135 tablet 3  . Multiple Vitamin (MULITIVITAMIN WITH MINERALS) TABS Take 1 tablet by mouth daily.    . Omega-3 Fatty Acids (FISH OIL) 1000 MG CAPS Take 1 capsule by mouth 2 (two) times daily.     Marland Kitchen omeprazole (PRILOSEC) 20 MG capsule Take 20 mg by mouth every other day.     Derrill Memo ON 04/16/2016] prochlorperazine (COMPAZINE) 10 MG tablet Take 10 mg by mouth every 6 (six) hours as needed for nausea or vomiting.    . simvastatin (ZOCOR) 20 MG tablet Take 20 mg by mouth every evening.    . Wound Dressings (SONAFINE) Apply 1 application topically daily. Apply after radiation daily on scalp once skin becomes irritated, red,itchy    . acetaminophen (TYLENOL) 500 MG tablet Take 500 mg by mouth every 6 (six) hours as needed for mild pain.     Marland Kitchen prochlorperazine (COMPAZINE) 10 MG tablet Take 1 tablet (10 mg total) by mouth every 6 (six) hours as needed for nausea or vomiting. [place]  tablet 0  . tacrolimus (PROTOPIC) 0.1 % ointment Apply 1 application topically 2 (two) times daily.     No current facility-administered medications for this encounter.      ALLERGIES: Review of patient's allergies indicates no known allergies.   LABORATORY DATA:  Lab Results  Component Value Date   WBC 13.0 (H) 04/06/2016   HGB 10.4 (L) 04/06/2016   HCT 32.0 (L) 04/06/2016   MCV 84.4 04/06/2016   PLT 364 04/06/2016   Lab Results  Component Value Date   NA 133 (L) 04/06/2016   K 4.1 04/06/2016   CL 97 (L) 04/06/2016   CO2 26 04/06/2016   Lab Results  Component Value Date   ALT 15 04/06/2016   AST 12 (L) 04/06/2016   ALKPHOS 63 04/06/2016   BILITOT 1.0 04/06/2016     NARRATIVE: [name] was seen today for weekly  treatment management. The chart was checked and the patient's films were reviewed.  [name] has completed 7 fractions to her whole brain and left lung.  She reports having cramping pain on and off in her right chest that started when radiation started.  She has been taking norco as needed.  She continues to have shortness of breath on and off.  She reports having a dry nonproductive cough.  She reports having daily headaches (lasting 10-15 minutes) that started before radiation. She states they are the same in severity as before starting radiation.  She is taking decadron 4 mg q 8 hours.  She reports having dizziness and denies having any vision changes.  The skin on her left chest and forehead/scalp is intact.  She is using sonafine and has been provided with a refill.  She is currently living at [facility].  She has lost 12 lbs since 04/01/16.  She reports having a good appetite, but reports nausea and emesis. Family states she fell a few days ago.  8:03 PM BP 110/64 (BP Location: Right Arm, Patient Position: Sitting)   Pulse 76   Temp 98 F (36.7 C) (Oral)   Ht '5\' 2"'$  (1.575 m)   Wt 167 lb 6.4 oz (75.9 kg)   SpO2 98%   BMI 30.62 kg/m   Wt Readings from Last 3 Encounters:  04/13/16 167 lb 6.4 oz (75.9 kg)  04/01/16 179 lb 0.2 oz (81.2 kg)  02/23/16 182 lb (82.6 kg)    PHYSICAL EXAMINATION: height is '5\' 2"'$  (1.575 m) and weight is 167 lb 6.4 oz (75.9 kg). Her oral temperature is 98 F (36.7 C). Her blood pressure is 110/64 and her pulse is 76. Her oxygen saturation is 98%.       In a wheelchair. No oral thrush noted.  ASSESSMENT: The patient is doing satisfactorily with treatment.  PLAN: We will continue with the patient's radiation treatment as planned. Continue her current dosage of steroids. We will call [facility] to add Compazine to her med list as PRN.  This document serves as a record of services personally performed by Kyung Rudd, MD. It was created on his behalf by Darcus Austin, a trained medical scribe. The creation of this record is based on the scribe's personal observations and the provider's statements to them. This document has been checked and approved by the attending provider.

## 2016-04-13 NOTE — Progress Notes (Addendum)
[name] has completed 7 fractions to her whole brain and left lung.  She reports having cramping pain on and off in her right chest that started when radiation started.  She has been taking norco as needed.  She continues to have shortness of breath on and off.  She reports having a dry nonproductive cough.  She reports having daily headaches that started before radiation.  She is taking decadron 4 mg q 8 hours.  She reports having dizziness and denies having any vision changes.  The skin on her left chest and forehead/scalp is intact.  She is using sonafine and has been provided with a refill.  She is currently living at [facility].  She has lost 12 lbs since 04/01/16.  She reports having a good appetite.  BP 110/64 (BP Location: Right Arm, Patient Position: Sitting)   Pulse 76   Temp 98 F (36.7 C) (Oral)   Ht '5\' 2"'$  (1.575 m)   Wt 167 lb 6.4 oz (75.9 kg)   SpO2 98%   BMI 30.62 kg/m    Wt Readings from Last 3 Encounters:  04/13/16 167 lb 6.4 oz (75.9 kg)  04/01/16 179 lb 0.2 oz (81.2 kg)  02/23/16 182 lb (82.6 kg)

## 2016-04-16 ENCOUNTER — Ambulatory Visit
Admission: RE | Admit: 2016-04-16 | Discharge: 2016-04-16 | Disposition: A | Discharge status: Home / Self Care | Payer: PPO | Source: Ambulatory Visit | Attending: Radiation Oncology | Admitting: Radiation Oncology

## 2016-04-16 DIAGNOSIS — Z510 Encounter for antineoplastic radiation therapy: Secondary | ICD-10-CM | POA: Diagnosis not present

## 2016-04-17 ENCOUNTER — Ambulatory Visit
Admission: RE | Admit: 2016-04-17 | Discharge: 2016-04-17 | Disposition: A | Discharge status: Home / Self Care | Payer: PPO | Source: Ambulatory Visit | Attending: Radiation Oncology | Admitting: Radiation Oncology

## 2016-04-17 DIAGNOSIS — Z510 Encounter for antineoplastic radiation therapy: Secondary | ICD-10-CM | POA: Diagnosis not present

## 2016-04-18 ENCOUNTER — Ambulatory Visit: Payer: PPO

## 2016-04-18 DIAGNOSIS — Z510 Encounter for antineoplastic radiation therapy: Secondary | ICD-10-CM | POA: Diagnosis not present

## 2016-04-19 ENCOUNTER — Ambulatory Visit
Admission: RE | Admit: 2016-04-19 | Discharge: 2016-04-19 | Disposition: A | Discharge status: Home / Self Care | Payer: PPO | Source: Ambulatory Visit | Attending: Radiation Oncology | Admitting: Radiation Oncology

## 2016-04-19 ENCOUNTER — Ambulatory Visit: Payer: PPO

## 2016-04-19 DIAGNOSIS — Z510 Encounter for antineoplastic radiation therapy: Secondary | ICD-10-CM | POA: Diagnosis not present

## 2016-04-20 ENCOUNTER — Encounter: Payer: Self-pay | Admitting: Radiation Oncology

## 2016-04-20 ENCOUNTER — Ambulatory Visit
Admission: RE | Admit: 2016-04-20 | Discharge: 2016-04-20 | Disposition: A | Discharge status: Home / Self Care | Payer: PPO | Source: Ambulatory Visit | Attending: Radiation Oncology | Admitting: Radiation Oncology

## 2016-04-20 ENCOUNTER — Ambulatory Visit: Payer: PPO

## 2016-04-20 VITALS — BP 92/63 | HR 73 | Temp 99.5°F | Resp 20

## 2016-04-20 DIAGNOSIS — Z510 Encounter for antineoplastic radiation therapy: Secondary | ICD-10-CM | POA: Diagnosis not present

## 2016-04-20 DIAGNOSIS — C7931 Secondary malignant neoplasm of brain: Secondary | ICD-10-CM

## 2016-04-20 DIAGNOSIS — C3492 Malignant neoplasm of unspecified part of left bronchus or lung: Secondary | ICD-10-CM

## 2016-04-20 NOTE — Progress Notes (Addendum)
Weekly rad txs brain and left lung, 12/30, has sonafine crezm to use daily, no skin changes,  No c/o head ache;last used sonafine last week stated, low grade fever, unable to get weighed too weak and feels achy all over, may be getting the flu, fell at nuirsing home  Monday morning, transferring from bed to w/c, nurse there, and made a report, no skin breakdown or bruising stated decadron '4mg'$  bid, no thrush seen  4:00 PM BP 92/63 (BP Location: Left Arm, Patient Position: Sitting, Cuff Size: Normal)   Pulse 73   Temp 99.5 F (37.5 C) (Oral)   Resp 20   SpO2 100%   Wt Readings from Last 3 Encounters:  04/13/16 167 lb 6.4 oz (75.9 kg)  04/01/16 179 lb 0.2 oz (81.2 kg)  02/23/16 182 lb (82.6 kg)

## 2016-04-20 NOTE — Progress Notes (Signed)
Department of Radiation Oncology  Phone:  [phone] Fax:        [phone]  Weekly Treatment Note    Name: [name] Date: 04/20/2016 MRN: [id number] DOB: 03-11-1951   Diagnosis:     ICD-9-CM ICD-10-CM   1. Brain metastases (HCC) 198.3 C79.31   2. Metastatic lung cancer (metastasis from lung to other site), left (HCC) 162.9 C34.92      Current dose: 21 Gy (left lung)  Current fraction: 12 (total including brain)   MEDICATIONS: Current Outpatient Prescriptions  Medication Sig Dispense Refill  . acetaminophen (TYLENOL) 500 MG tablet Take 500 mg by mouth every 6 (six) hours as needed for mild pain.     Marland Kitchen albuterol (PROAIR HFA) 108 (90 BASE) MCG/ACT inhaler Inhale 1 puff into the lungs every 6 (six) hours as needed for wheezing or shortness of breath.    . B-Complex TABS Take 1 tablet by mouth daily.    . Biotin 5000 MCG CAPS Take 1 capsule by mouth 2 (two) times daily.    . budesonide-formoterol (SYMBICORT) 160-4.5 MCG/ACT inhaler Inhale 2 puffs into the lungs 2 (two) times daily.    . Calcium-Magnesium-Vitamin D 200-100-33.3 MG-MG-UNIT CAPS Take 1 capsule by mouth daily.    . clobetasol (TEMOVATE) 0.05 % cream Apply 1 application topically 2 (two) times daily.     Marland Kitchen dexamethasone (DECADRON) 4 MG tablet Take 1 tablet (4 mg total) by mouth every 8 (eight) hours. 30 tablet 0  . DHA-EPA-Flaxseed Oil-Vitamin E (THERA TEARS NUTRITION PO) Take 1 drop by mouth daily.    Marland Kitchen glipiZIDE (GLUCOTROL) 10 MG tablet Take 10 mg by mouth 2 (two) times daily.      Marland Kitchen HYDROcodone-acetaminophen (NORCO/VICODIN) 5-325 MG tablet Take 1 tablet by mouth 2 (two) times daily as needed for moderate pain. 5 tablet 0  . insulin aspart (NOVOLOG) 100 UNIT/ML injection Inject 0-20 Units into the skin 3 (three) times daily with meals. 10 mL 0  . Insulin Glargine (LANTUS SOLOSTAR) 100 UNIT/ML Solostar Pen Inject 15 Units into the skin daily at 10 pm. 15 mL 0  . LORazepam (ATIVAN) 0.5 MG tablet Take 1  tablet (0.5 mg total) by mouth every 8 (eight) hours as needed for anxiety or sleep. 5 tablet 0  . metFORMIN (GLUCOPHAGE) 850 MG tablet Take 850 mg by mouth 2 (two) times daily with a meal.     . metoprolol succinate (TOPROL XL) 25 MG 24 hr tablet Take 1 tablet (25 mg) in the am, and 1/2 tablet (12.5 mg) at bedtime (Patient taking differently: Take 25 mg by mouth daily. ) 135 tablet 3  . Multiple Vitamin (MULITIVITAMIN WITH MINERALS) TABS Take 1 tablet by mouth daily.    . Omega-3 Fatty Acids (FISH OIL) 1000 MG CAPS Take 1 capsule by mouth 2 (two) times daily.     Marland Kitchen omeprazole (PRILOSEC) 20 MG capsule Take 20 mg by mouth every other day.     . prochlorperazine (COMPAZINE) 10 MG tablet Take 1 tablet (10 mg total) by mouth every 6 (six) hours as needed for nausea or vomiting. 30 tablet 0  . prochlorperazine (COMPAZINE) 10 MG tablet Take 10 mg by mouth every 6 (six) hours as needed for nausea or vomiting.    . simvastatin (ZOCOR) 20 MG tablet Take 20 mg by mouth every evening.    . tacrolimus (PROTOPIC) 0.1 % ointment Apply 1 application topically 2 (two) times daily.    . Wound Dressings (SONAFINE) Apply 1 application  topically daily. Apply after radiation daily on scalp once skin becomes irritated, red,itchy     No current facility-administered medications for this encounter.      ALLERGIES: Review of patient's allergies indicates no known allergies.   LABORATORY DATA:  Lab Results  Component Value Date   WBC 13.0 (H) 04/06/2016   HGB 10.4 (L) 04/06/2016   HCT 32.0 (L) 04/06/2016   MCV 84.4 04/06/2016   PLT 364 04/06/2016   Lab Results  Component Value Date   NA 133 (L) 04/06/2016   K 4.1 04/06/2016   CL 97 (L) 04/06/2016   CO2 26 04/06/2016   Lab Results  Component Value Date   ALT 15 04/06/2016   AST 12 (L) 04/06/2016   ALKPHOS 63 04/06/2016   BILITOT 1.0 04/06/2016     NARRATIVE: [name] was seen today for weekly treatment management. The chart was checked and  the patient's films were reviewed.  Weekly rad txs brain and left lung, 12/30, has sonafine crezm to use daily, no skin changes,  No c/o head ache;last used sonafine last week stated, low grade fever, unable to get weighed too weak and feels achy all over, may be getting the flu, fell at nuirsing home  Monday morning, transferring from bed to w/c, nurse there, and made a report, no skin breakdown or bruising stated decadron '4mg'$  bid, no thrush seen  7:00 PM BP 92/63 (BP Location: Left Arm, Patient Position: Sitting, Cuff Size: Normal)   Pulse 73   Temp 99.5 F (37.5 C) (Oral)   Resp 20   SpO2 100%   Wt Readings from Last 3 Encounters:  04/13/16 167 lb 6.4 oz (75.9 kg)  04/01/16 179 lb 0.2 oz (81.2 kg)  02/23/16 182 lb (82.6 kg)    PHYSICAL EXAMINATION: oral temperature is 99.5 F (37.5 C). Her blood pressure is 92/63 and her pulse is 73. Her respiration is 20 and oxygen saturation is 100%.        ASSESSMENT: The patient is doing satisfactorily with treatment.  PLAN: We will continue with the patient's radiation treatment as planned. The patient will decrease her Decadron dose to 4 mg twice a day. From the patient's available records from her current facility, I believe she still is taking Decadron 3 times per day. Therefore she can decrease this and this was written as an order in the patient's chart which she brought. We will further give additional instructions next week as she is finishing her treatment to the lung.

## 2016-04-23 DIAGNOSIS — C3412 Malignant neoplasm of upper lobe, left bronchus or lung: Secondary | ICD-10-CM | POA: Insufficient documentation

## 2016-04-23 NOTE — Progress Notes (Signed)
  Radiation Oncology         [phone] ________________________________  Name: [name] MRN: [id number]  Date: 04/09/2016  DOB: 1951/02/21  SIMULATION AND TREATMENT PLANNING NOTE  DIAGNOSIS:     ICD-9-CM ICD-10-CM   1. 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  Identity was confirmed.  All relevant records and images related to the planned course of therapy were reviewed.   Written consent to proceed with treatment was confirmed which was freely given after reviewing the details related to the planned course of therapy had been reviewed with the patient.  Then, the patient was set-up in a stable reproducible  supine position for radiation therapy.  CT images were obtained.  Surface markings were placed.    Medically necessary complex treatment device(s) for immobilization:  Not applicable.   The CT images were loaded into the planning software.  Then the target and avoidance structures were contoured.  Treatment planning then occurred.  The radiation prescription was entered and confirmed.  A total of 3 complex treatment devices were fabricated which relate to the designed radiation treatment fields. Each of these customized fields/ complex treatment devices will be used on a daily basis during the radiation course. I have requested : 3D Simulation  I have requested a DVH of the following structures: Target volume, lungs, spinal cord, esophagus.   The patient will undergo daily image guidance to ensure accurate localization of the target, and adequate minimize dose to the normal surrounding structures in close proximity to the target.   PLAN:  The patient will receive 30 Gy in 10 fractions.  ________________________________   Jodelle Gross, MD, PhD

## 2016-04-24 ENCOUNTER — Ambulatory Visit
Admission: RE | Admit: 2016-04-24 | Discharge: 2016-04-24 | Disposition: A | Discharge status: Home / Self Care | Payer: PPO | Source: Ambulatory Visit | Attending: Radiation Oncology | Admitting: Radiation Oncology

## 2016-04-24 DIAGNOSIS — Z510 Encounter for antineoplastic radiation therapy: Secondary | ICD-10-CM | POA: Diagnosis not present

## 2016-04-25 ENCOUNTER — Encounter: Payer: Self-pay | Admitting: Radiation Oncology

## 2016-04-25 ENCOUNTER — Telehealth: Payer: Self-pay | Admitting: *Deleted

## 2016-04-25 ENCOUNTER — Inpatient Hospital Stay (HOSPITAL_COMMUNITY)
Admission: EM | Admit: 2016-04-25 | Discharge: 2016-05-02 | DRG: 872 | Disposition: A | Discharge status: Hospice | Payer: PPO | Attending: Internal Medicine | Admitting: Internal Medicine

## 2016-04-25 ENCOUNTER — Encounter (HOSPITAL_COMMUNITY): Payer: Self-pay | Admitting: Emergency Medicine

## 2016-04-25 ENCOUNTER — Other Ambulatory Visit: Payer: Self-pay

## 2016-04-25 ENCOUNTER — Ambulatory Visit
Admission: RE | Admit: 2016-04-25 | Discharge: 2016-04-25 | Disposition: A | Discharge status: Home / Self Care | Payer: PPO | Source: Ambulatory Visit | Attending: Radiation Oncology | Admitting: Radiation Oncology

## 2016-04-25 ENCOUNTER — Emergency Department (HOSPITAL_COMMUNITY): Payer: PPO

## 2016-04-25 VITALS — BP 85/73 | HR 107 | Temp 98.6°F | Resp 20

## 2016-04-25 DIAGNOSIS — M25511 Pain in right shoulder: Secondary | ICD-10-CM

## 2016-04-25 DIAGNOSIS — J4530 Mild persistent asthma, uncomplicated: Secondary | ICD-10-CM | POA: Diagnosis present

## 2016-04-25 DIAGNOSIS — Z515 Encounter for palliative care: Secondary | ICD-10-CM | POA: Diagnosis present

## 2016-04-25 DIAGNOSIS — E119 Type 2 diabetes mellitus without complications: Secondary | ICD-10-CM | POA: Diagnosis present

## 2016-04-25 DIAGNOSIS — M75101 Unspecified rotator cuff tear or rupture of right shoulder, not specified as traumatic: Secondary | ICD-10-CM | POA: Diagnosis present

## 2016-04-25 DIAGNOSIS — K5900 Constipation, unspecified: Secondary | ICD-10-CM

## 2016-04-25 DIAGNOSIS — C7931 Secondary malignant neoplasm of brain: Secondary | ICD-10-CM | POA: Diagnosis present

## 2016-04-25 DIAGNOSIS — C7951 Secondary malignant neoplasm of bone: Secondary | ICD-10-CM | POA: Diagnosis not present

## 2016-04-25 DIAGNOSIS — Z87891 Personal history of nicotine dependence: Secondary | ICD-10-CM

## 2016-04-25 DIAGNOSIS — W19XXXA Unspecified fall, initial encounter: Secondary | ICD-10-CM | POA: Diagnosis present

## 2016-04-25 DIAGNOSIS — Z9181 History of falling: Secondary | ICD-10-CM

## 2016-04-25 DIAGNOSIS — D62 Acute posthemorrhagic anemia: Secondary | ICD-10-CM | POA: Diagnosis present

## 2016-04-25 DIAGNOSIS — A419 Sepsis, unspecified organism: Principal | ICD-10-CM | POA: Diagnosis present

## 2016-04-25 DIAGNOSIS — N390 Urinary tract infection, site not specified: Secondary | ICD-10-CM | POA: Diagnosis present

## 2016-04-25 DIAGNOSIS — C7989 Secondary malignant neoplasm of other specified sites: Secondary | ICD-10-CM | POA: Diagnosis present

## 2016-04-25 DIAGNOSIS — Z7952 Long term (current) use of systemic steroids: Secondary | ICD-10-CM

## 2016-04-25 DIAGNOSIS — I9589 Other hypotension: Secondary | ICD-10-CM | POA: Diagnosis present

## 2016-04-25 DIAGNOSIS — C7900 Secondary malignant neoplasm of unspecified kidney and renal pelvis: Secondary | ICD-10-CM | POA: Diagnosis present

## 2016-04-25 DIAGNOSIS — Z7951 Long term (current) use of inhaled steroids: Secondary | ICD-10-CM

## 2016-04-25 DIAGNOSIS — Z794 Long term (current) use of insulin: Secondary | ICD-10-CM

## 2016-04-25 DIAGNOSIS — I471 Supraventricular tachycardia: Secondary | ICD-10-CM | POA: Diagnosis present

## 2016-04-25 DIAGNOSIS — E785 Hyperlipidemia, unspecified: Secondary | ICD-10-CM | POA: Diagnosis present

## 2016-04-25 DIAGNOSIS — D630 Anemia in neoplastic disease: Secondary | ICD-10-CM | POA: Diagnosis present

## 2016-04-25 DIAGNOSIS — G893 Neoplasm related pain (acute) (chronic): Secondary | ICD-10-CM | POA: Diagnosis not present

## 2016-04-25 DIAGNOSIS — D899 Disorder involving the immune mechanism, unspecified: Secondary | ICD-10-CM | POA: Diagnosis present

## 2016-04-25 DIAGNOSIS — Z79899 Other long term (current) drug therapy: Secondary | ICD-10-CM

## 2016-04-25 DIAGNOSIS — C3490 Malignant neoplasm of unspecified part of unspecified bronchus or lung: Secondary | ICD-10-CM | POA: Diagnosis not present

## 2016-04-25 DIAGNOSIS — I10 Essential (primary) hypertension: Secondary | ICD-10-CM | POA: Diagnosis present

## 2016-04-25 DIAGNOSIS — Z7722 Contact with and (suspected) exposure to environmental tobacco smoke (acute) (chronic): Secondary | ICD-10-CM | POA: Diagnosis present

## 2016-04-25 DIAGNOSIS — F329 Major depressive disorder, single episode, unspecified: Secondary | ICD-10-CM | POA: Diagnosis present

## 2016-04-25 DIAGNOSIS — E860 Dehydration: Secondary | ICD-10-CM | POA: Diagnosis present

## 2016-04-25 DIAGNOSIS — D696 Thrombocytopenia, unspecified: Secondary | ICD-10-CM | POA: Diagnosis present

## 2016-04-25 DIAGNOSIS — C3492 Malignant neoplasm of unspecified part of left bronchus or lung: Secondary | ICD-10-CM

## 2016-04-25 DIAGNOSIS — Z66 Do not resuscitate: Secondary | ICD-10-CM | POA: Diagnosis not present

## 2016-04-25 DIAGNOSIS — Z7189 Other specified counseling: Secondary | ICD-10-CM

## 2016-04-25 DIAGNOSIS — Z8249 Family history of ischemic heart disease and other diseases of the circulatory system: Secondary | ICD-10-CM

## 2016-04-25 DIAGNOSIS — Z825 Family history of asthma and other chronic lower respiratory diseases: Secondary | ICD-10-CM

## 2016-04-25 DIAGNOSIS — C3412 Malignant neoplasm of upper lobe, left bronchus or lung: Secondary | ICD-10-CM | POA: Diagnosis present

## 2016-04-25 DIAGNOSIS — L900 Lichen sclerosus et atrophicus: Secondary | ICD-10-CM | POA: Diagnosis present

## 2016-04-25 DIAGNOSIS — F419 Anxiety disorder, unspecified: Secondary | ICD-10-CM | POA: Diagnosis present

## 2016-04-25 DIAGNOSIS — R296 Repeated falls: Secondary | ICD-10-CM | POA: Diagnosis present

## 2016-04-25 DIAGNOSIS — Z9049 Acquired absence of other specified parts of digestive tract: Secondary | ICD-10-CM

## 2016-04-25 DIAGNOSIS — K219 Gastro-esophageal reflux disease without esophagitis: Secondary | ICD-10-CM | POA: Diagnosis present

## 2016-04-25 DIAGNOSIS — D649 Anemia, unspecified: Secondary | ICD-10-CM | POA: Diagnosis not present

## 2016-04-25 DIAGNOSIS — Z801 Family history of malignant neoplasm of trachea, bronchus and lung: Secondary | ICD-10-CM

## 2016-04-25 DIAGNOSIS — T380X5A Adverse effect of glucocorticoids and synthetic analogues, initial encounter: Secondary | ICD-10-CM | POA: Diagnosis present

## 2016-04-25 DIAGNOSIS — R627 Adult failure to thrive: Secondary | ICD-10-CM | POA: Diagnosis present

## 2016-04-25 DIAGNOSIS — K2961 Other gastritis with bleeding: Secondary | ICD-10-CM | POA: Diagnosis not present

## 2016-04-25 DIAGNOSIS — K922 Gastrointestinal hemorrhage, unspecified: Secondary | ICD-10-CM | POA: Diagnosis present

## 2016-04-25 DIAGNOSIS — K5909 Other constipation: Secondary | ICD-10-CM

## 2016-04-25 DIAGNOSIS — I959 Hypotension, unspecified: Secondary | ICD-10-CM | POA: Diagnosis not present

## 2016-04-25 DIAGNOSIS — R531 Weakness: Secondary | ICD-10-CM | POA: Diagnosis present

## 2016-04-25 LAB — CBC WITH DIFFERENTIAL/PLATELET
BASOS ABS: 0 10*3/uL (ref 0.0–0.1)
Basophils Relative: 0 %
EOS PCT: 0 %
Eosinophils Absolute: 0 10*3/uL (ref 0.0–0.7)
HCT: 24.4 % — ABNORMAL LOW (ref 36.0–46.0)
Hemoglobin: 7.8 g/dL — ABNORMAL LOW (ref 12.0–15.0)
LYMPHS ABS: 0.6 10*3/uL — AB (ref 0.7–4.0)
LYMPHS PCT: 3 %
MCH: 26.9 pg (ref 26.0–34.0)
MCHC: 32 g/dL (ref 30.0–36.0)
MCV: 84.1 fL (ref 78.0–100.0)
MONO ABS: 1.2 10*3/uL — AB (ref 0.1–1.0)
Monocytes Relative: 6 %
Neutro Abs: 18.2 10*3/uL — ABNORMAL HIGH (ref 1.7–7.7)
Neutrophils Relative %: 91 %
PLATELETS: 161 10*3/uL (ref 150–400)
RBC: 2.9 MIL/uL — ABNORMAL LOW (ref 3.87–5.11)
RDW: 14.8 % (ref 11.5–15.5)
WBC: 20.1 10*3/uL — ABNORMAL HIGH (ref 4.0–10.5)

## 2016-04-25 LAB — I-STAT CHEM 8, ED
BUN: 24 mg/dL — ABNORMAL HIGH (ref 6–20)
Calcium, Ion: 1.2 mmol/L (ref 1.15–1.40)
Chloride: 100 mmol/L — ABNORMAL LOW (ref 101–111)
Creatinine, Ser: 0.7 mg/dL (ref 0.44–1.00)
GLUCOSE: 209 mg/dL — AB (ref 65–99)
HEMATOCRIT: 25 % — AB (ref 36.0–46.0)
HEMOGLOBIN: 8.5 g/dL — AB (ref 12.0–15.0)
POTASSIUM: 3.9 mmol/L (ref 3.5–5.1)
Sodium: 134 mmol/L — ABNORMAL LOW (ref 135–145)
TCO2: 20 mmol/L (ref 0–100)

## 2016-04-25 LAB — GLUCOSE, CAPILLARY: GLUCOSE-CAPILLARY: 212 mg/dL — AB (ref 65–99)

## 2016-04-25 LAB — CBC
HEMATOCRIT: 20.9 % — AB (ref 36.0–46.0)
HEMOGLOBIN: 6.7 g/dL — AB (ref 12.0–15.0)
MCH: 27.9 pg (ref 26.0–34.0)
MCHC: 32.1 g/dL (ref 30.0–36.0)
MCV: 87.1 fL (ref 78.0–100.0)
Platelets: 141 10*3/uL — ABNORMAL LOW (ref 150–400)
RBC: 2.4 MIL/uL — AB (ref 3.87–5.11)
RDW: 15.1 % (ref 11.5–15.5)
WBC: 17.5 10*3/uL — ABNORMAL HIGH (ref 4.0–10.5)

## 2016-04-25 LAB — URINE MICROSCOPIC-ADD ON

## 2016-04-25 LAB — COMPREHENSIVE METABOLIC PANEL
ALT: 16 U/L (ref 14–54)
ANION GAP: 9 (ref 5–15)
AST: 14 U/L — ABNORMAL LOW (ref 15–41)
Albumin: 2.7 g/dL — ABNORMAL LOW (ref 3.5–5.0)
Alkaline Phosphatase: 77 U/L (ref 38–126)
BUN: 28 mg/dL — ABNORMAL HIGH (ref 6–20)
CHLORIDE: 104 mmol/L (ref 101–111)
CO2: 21 mmol/L — ABNORMAL LOW (ref 22–32)
Calcium: 8.9 mg/dL (ref 8.9–10.3)
Creatinine, Ser: 0.75 mg/dL (ref 0.44–1.00)
Glucose, Bld: 211 mg/dL — ABNORMAL HIGH (ref 65–99)
POTASSIUM: 3.9 mmol/L (ref 3.5–5.1)
Sodium: 134 mmol/L — ABNORMAL LOW (ref 135–145)
Total Bilirubin: 1.5 mg/dL — ABNORMAL HIGH (ref 0.3–1.2)
Total Protein: 6.1 g/dL — ABNORMAL LOW (ref 6.5–8.1)

## 2016-04-25 LAB — LIPASE, BLOOD: LIPASE: 19 U/L (ref 11–51)

## 2016-04-25 LAB — URINALYSIS, ROUTINE W REFLEX MICROSCOPIC
Glucose, UA: NEGATIVE mg/dL
Hgb urine dipstick: NEGATIVE
KETONES UR: NEGATIVE mg/dL
Nitrite: NEGATIVE
PROTEIN: NEGATIVE mg/dL
Specific Gravity, Urine: 1.027 (ref 1.005–1.030)
pH: 5.5 (ref 5.0–8.0)

## 2016-04-25 LAB — I-STAT CG4 LACTIC ACID, ED: Lactic Acid, Venous: 3.85 mmol/L (ref 0.5–1.9)

## 2016-04-25 LAB — LACTIC ACID, PLASMA: Lactic Acid, Venous: 1.9 mmol/L (ref 0.5–1.9)

## 2016-04-25 LAB — MAGNESIUM: MAGNESIUM: 1.8 mg/dL (ref 1.7–2.4)

## 2016-04-25 LAB — ABO/RH: ABO/RH(D): O POS

## 2016-04-25 LAB — POC OCCULT BLOOD, ED: FECAL OCCULT BLD: POSITIVE — AB

## 2016-04-25 MED ORDER — MOMETASONE FURO-FORMOTEROL FUM 200-5 MCG/ACT IN AERO
2.0000 | INHALATION_SPRAY | Freq: Two times a day (BID) | RESPIRATORY_TRACT | Status: DC
  Administered 2016-04-26 – 2016-04-28 (×6): 2 via RESPIRATORY_TRACT
  Filled 2016-04-25: qty 8.8

## 2016-04-25 MED ORDER — SODIUM CHLORIDE 0.9 % IV SOLN
INTRAVENOUS | Status: AC
  Administered 2016-04-25 – 2016-04-26 (×2): via INTRAVENOUS

## 2016-04-25 MED ORDER — SODIUM CHLORIDE 0.9 % IV SOLN
1500.0000 mg | INTRAVENOUS | Status: AC
  Administered 2016-04-25: 1500 mg via INTRAVENOUS
  Filled 2016-04-25: qty 1500

## 2016-04-25 MED ORDER — PANTOPRAZOLE SODIUM 40 MG IV SOLR
80.0000 mg | Freq: Once | INTRAVENOUS | Status: AC
  Administered 2016-04-25: 80 mg via INTRAVENOUS
  Filled 2016-04-25: qty 80

## 2016-04-25 MED ORDER — ALBUTEROL SULFATE (2.5 MG/3ML) 0.083% IN NEBU
3.0000 mL | INHALATION_SOLUTION | Freq: Four times a day (QID) | RESPIRATORY_TRACT | Status: DC | PRN
  Administered 2016-04-27 – 2016-04-28 (×2): 3 mL via RESPIRATORY_TRACT
  Filled 2016-04-25 (×2): qty 3

## 2016-04-25 MED ORDER — LORAZEPAM 0.5 MG PO TABS
0.5000 mg | ORAL_TABLET | Freq: Three times a day (TID) | ORAL | Status: DC | PRN
  Administered 2016-04-25: 0.5 mg via ORAL
  Filled 2016-04-25: qty 1

## 2016-04-25 MED ORDER — ONDANSETRON HCL 4 MG/2ML IJ SOLN: 4.0000 mg | Freq: Four times a day (QID) | INTRAMUSCULAR | Status: DC | PRN

## 2016-04-25 MED ORDER — TACROLIMUS 0.1 % EX OINT: 1.0000 "application " | TOPICAL_OINTMENT | Freq: Two times a day (BID) | CUTANEOUS | Status: DC

## 2016-04-25 MED ORDER — SODIUM CHLORIDE 0.9 % IV SOLN
8.0000 mg/h | INTRAVENOUS | Status: DC
  Administered 2016-04-25 – 2016-04-26 (×2): 8 mg/h via INTRAVENOUS
  Filled 2016-04-25 (×5): qty 80

## 2016-04-25 MED ORDER — ACETAMINOPHEN 325 MG PO TABS: 650.0000 mg | ORAL_TABLET | Freq: Four times a day (QID) | ORAL | Status: DC | PRN

## 2016-04-25 MED ORDER — DEXAMETHASONE SODIUM PHOSPHATE 4 MG/ML IJ SOLN
4.0000 mg | Freq: Two times a day (BID) | INTRAMUSCULAR | Status: DC
  Administered 2016-04-25 – 2016-05-02 (×14): 4 mg via INTRAVENOUS
  Filled 2016-04-25 (×14): qty 1

## 2016-04-25 MED ORDER — MORPHINE SULFATE (PF) 4 MG/ML IV SOLN
6.0000 mg | Freq: Once | INTRAVENOUS | Status: AC
  Administered 2016-04-25: 6 mg via INTRAVENOUS
  Filled 2016-04-25: qty 2

## 2016-04-25 MED ORDER — CEFEPIME HCL 1 G IJ SOLR
1.0000 g | Freq: Three times a day (TID) | INTRAMUSCULAR | Status: DC
  Administered 2016-04-25 – 2016-04-28 (×8): 1 g via INTRAVENOUS
  Filled 2016-04-25 (×8): qty 1

## 2016-04-25 MED ORDER — PIPERACILLIN-TAZOBACTAM 3.375 G IVPB 30 MIN
3.3750 g | Freq: Once | INTRAVENOUS | Status: AC
  Administered 2016-04-25: 3.375 g via INTRAVENOUS
  Filled 2016-04-25: qty 50

## 2016-04-25 MED ORDER — VANCOMYCIN HCL IN DEXTROSE 1-5 GM/200ML-% IV SOLN
1000.0000 mg | Freq: Two times a day (BID) | INTRAVENOUS | Status: DC
  Administered 2016-04-26 – 2016-04-27 (×4): 1000 mg via INTRAVENOUS
  Filled 2016-04-25 (×5): qty 200

## 2016-04-25 MED ORDER — SODIUM CHLORIDE 0.9 % IV BOLUS (SEPSIS)
1000.0000 mL | Freq: Once | INTRAVENOUS | Status: AC
  Administered 2016-04-25: 1000 mL via INTRAVENOUS

## 2016-04-25 MED ORDER — PRUTECT EX EMUL
1.0000 "application " | Freq: Every day | CUTANEOUS | Status: DC
  Administered 2016-04-26 – 2016-05-02 (×4): 1 via TOPICAL
  Filled 2016-04-25: qty 45

## 2016-04-25 MED ORDER — ONDANSETRON HCL 4 MG PO TABS: 4.0000 mg | ORAL_TABLET | Freq: Four times a day (QID) | ORAL | Status: DC | PRN

## 2016-04-25 MED ORDER — PANTOPRAZOLE SODIUM 40 MG IV SOLR
40.0000 mg | Freq: Two times a day (BID) | INTRAVENOUS | Status: DC
  Administered 2016-04-29 – 2016-05-02 (×7): 40 mg via INTRAVENOUS
  Filled 2016-04-25 (×4): qty 40

## 2016-04-25 MED ORDER — INSULIN ASPART 100 UNIT/ML ~~LOC~~ SOLN
0.0000 [IU] | Freq: Three times a day (TID) | SUBCUTANEOUS | Status: DC
  Administered 2016-04-26 (×2): 3 [IU] via SUBCUTANEOUS
  Administered 2016-04-26: 2 [IU] via SUBCUTANEOUS
  Administered 2016-04-27: 3 [IU] via SUBCUTANEOUS
  Administered 2016-04-27 (×2): 5 [IU] via SUBCUTANEOUS
  Administered 2016-04-28: 9 [IU] via SUBCUTANEOUS
  Administered 2016-04-28: 7 [IU] via SUBCUTANEOUS

## 2016-04-25 MED ORDER — MORPHINE SULFATE (PF) 2 MG/ML IV SOLN
2.0000 mg | INTRAVENOUS | Status: DC | PRN
Start: 2016-04-25 — End: 2016-04-26
  Administered 2016-04-26: 2 mg via INTRAVENOUS
  Filled 2016-04-25: qty 1

## 2016-04-25 MED ORDER — MORPHINE SULFATE (PF) 2 MG/ML IV SOLN
1.0000 mg | INTRAVENOUS | Status: DC | PRN
  Administered 2016-04-25 – 2016-04-28 (×4): 1 mg via INTRAVENOUS
  Filled 2016-04-25 (×5): qty 1

## 2016-04-25 MED ORDER — ACETAMINOPHEN 650 MG RE SUPP: 650.0000 mg | Freq: Four times a day (QID) | RECTAL | Status: DC | PRN

## 2016-04-25 NOTE — ED Provider Notes (Addendum)
[place] DEPT Provider Note   CSN: [id number] Arrival date & time: 04/25/16  1553     History   Chief Complaint Chief Complaint  Patient presents with  . Failure To Thrive    HPI PRISEIS CRATTY is a 65 y.o. female. PMH of brain cancer on radiation, here with AMS and dehydration.  Patient states her BP was low During routine check at her facility. She only complained of pain in her right shoulder where she had a fall a couple of days ago. She is still has not had an x-ray at her facility. She denies any subjective fevers, respirations symptoms, abdominal pain or dysuria. She is compliant with medications and radiation therapy for her cancer. Her sister in the room, patient is altered and not acting her normal self. There are no further complaints.  10 Systems reviewed and are negative for acute change except as noted in the HPI.    HPI  Past Medical History:  Diagnosis Date  . Anxiety   . Asthma   . Bronchitis   . Depression   . Essential hypertension   . GERD (gastroesophageal reflux disease)   . Hyperlipidemia   . Lichen sclerosus   . Osteoarthritis   . SVT (supraventricular tachycardia) (HCC)    No inducible arrhythmias by EP study 11/11  . Type 2 diabetes mellitus [facility])     Patient Active Problem List   Diagnosis Date Noted  . 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 Metastatic lung cancer (metastasis from lung to other site) ([address]) 04/01/2016  . Brain metastases ([place]) 04/01/2016  . Bilateral renal masses 04/01/2016  . Falls 04/01/2016  . Confusion 04/01/2016  . Leukocytosis 04/01/2016  . Cerebral brain hemorrhage ([place]) 04/01/2016  . Metastatic primary lung cancer ([place]) 04/01/2016  . Hypertension 06/23/2012  . Snoring 04/12/2012  . Chest pain 04/10/2012  . Obesity 04/10/2012  . Hyperlipidemia 03/02/2011  . Diabetes mellitus type II, non insulin dependent ([place]) 08/01/2010  . ASTHMA UNSPECIFIED WITH EXACERBATION 01/24/2010  .  PALPITATIONS 01/24/2010  . ARRHYTHMIA, HX OF 01/24/2010  . HEMORRHOIDS 06/21/2009  . RECTAL BLEEDING 06/21/2009  . Paroxysmal supraventricular tachycardia ([place]) 07/27/2008  . Asthma, mild persistent 07/27/2008  . GERD 07/27/2008    Past Surgical History:  Procedure Laterality Date  . CESAREAN SECTION WITH BILATERAL TUBAL LIGATION  1978  . CHOLECYSTECTOMY  2007  . EP study  11/11    OB History    No data available       Home Medications    Prior to Admission medications   Medication Sig Start Date End Date Taking? Authorizing Provider  acetaminophen (TYLENOL) 500 MG tablet Take 500 mg by mouth every 6 (six) hours as needed for mild pain.    Yes Historical Provider, MD  albuterol (PROAIR HFA) 108 (90 BASE) MCG/ACT inhaler Inhale 1 puff into the lungs every 6 (six) hours as needed for wheezing or shortness of breath.   Yes Historical Provider, MD  B-Complex TABS Take 1 tablet by mouth daily.   Yes Historical Provider, MD  Biotin 5000 MCG CAPS Take 1 capsule by mouth 2 (two) times daily.   Yes Historical Provider, MD  budesonide-formoterol (SYMBICORT) 160-4.5 MCG/ACT inhaler Inhale 2 puffs into the lungs 2 (two) times daily.   Yes Historical Provider, MD  Calcium-Magnesium-Vitamin D 200-100-33.3 MG-MG-UNIT CAPS Take 1 capsule by mouth daily.   Yes Historical Provider, MD  clobetasol (TEMOVATE) 0.05 % cream Apply 1 application topically 2 (two)  times daily.    Yes Historical Provider, MD  dexamethasone (DECADRON) 4 MG tablet Take 1 tablet (4 mg total) by mouth every 8 (eight) hours. Patient taking differently: Take 4 mg by mouth 2 (two) times daily.  04/07/16  Yes Lavina Hamman, MD  DHA-EPA-Flaxseed Oil-Vitamin E (THERA TEARS NUTRITION PO) Take 1 drop by mouth daily.   Yes Historical Provider, MD  glipiZIDE (GLUCOTROL) 10 MG tablet Take 10 mg by mouth 2 (two) times daily.     Yes Historical Provider, MD  HYDROcodone-acetaminophen (NORCO/VICODIN) 5-325 MG tablet Take 1 tablet by mouth  2 (two) times daily as needed for moderate pain. 04/07/16  Yes Lavina Hamman, MD  insulin aspart (NOVOLOG) 100 UNIT/ML injection Inject 0-20 Units into the skin 3 (three) times daily with meals. Patient taking differently: Inject 0-9 Units into the skin 3 (three) times daily with meals. 60-100 = 0 units 101-150 = 1 unit  151-200 = 2 units 201-250 = 3 units 251-300 = 5 units 301-350 = 7 units > 350 = 9 units > 400 CALL MD 04/07/16  Yes Lavina Hamman, MD  Insulin Glargine (LANTUS SOLOSTAR) 100 UNIT/ML Solostar Pen Inject 15 Units into the skin daily at 10 pm. 04/07/16  Yes Lavina Hamman, MD  LORazepam (ATIVAN) 0.5 MG tablet Take 1 tablet (0.5 mg total) by mouth every 8 (eight) hours as needed for anxiety or sleep. 04/07/16  Yes Lavina Hamman, MD  metFORMIN (GLUCOPHAGE) 850 MG tablet Take 850 mg by mouth 2 (two) times daily with a meal.    Yes Historical Provider, MD  metoprolol succinate (TOPROL XL) 25 MG 24 hr tablet Take 1 tablet (25 mg) in the am, and 1/2 tablet (12.5 mg) at bedtime Patient taking differently: Take 12.5-25 mg by mouth 2 (two) times daily. Takes '25mg'$  in the morning and 12.'5mg'$  at bedtime 01/24/16  Yes Satira Sark, MD  Multiple Vitamin (MULITIVITAMIN WITH MINERALS) TABS Take 1 tablet by mouth daily.   Yes Historical Provider, MD  Omega-3 Fatty Acids (FISH OIL) 1000 MG CAPS Take 1 capsule by mouth 2 (two) times daily.    Yes Historical Provider, MD  omeprazole (PRILOSEC) 20 MG capsule Take 20 mg by mouth every other day.    Yes Historical Provider, MD  prochlorperazine (COMPAZINE) 10 MG tablet Take 1 tablet (10 mg total) by mouth every 6 (six) hours as needed for nausea or vomiting. 04/13/16  Yes Kyung Rudd, MD  simvastatin (ZOCOR) 20 MG tablet Take 20 mg by mouth every evening.   Yes Historical Provider, MD  tacrolimus (PROTOPIC) 0.1 % ointment Apply 1 application topically 2 (two) times daily.   Yes Historical Provider, MD  Wound Dressings (SONAFINE) Apply 1 application  topically daily. Apply after radiation daily on scalp once skin becomes irritated, red,itchy 04/06/16  Yes Historical Provider, MD    Family History Family History  Problem Relation Age of Onset  . Hypertension Father   . COPD Father   . Parkinson's disease Father   . Hypertension Mother     Social History Social History  Substance Use Topics  . Smoking status: Former Smoker    Types: Cigarettes  . Smokeless tobacco: Never Used     Comment: quit in 1980's  . Alcohol use No     Allergies   Review of patient's allergies indicates no known allergies.   Review of Systems Review of Systems   Physical Exam Updated Vital Signs BP 121/63   Pulse 77  Temp 98.4 F (36.9 C) (Rectal)   Resp 14   SpO2 97%   Physical Exam  Constitutional: She is oriented to person, place, and time. She appears well-developed and well-nourished. No distress.  HENT:  Head: Normocephalic and atraumatic.  Nose: Nose normal.  Mouth/Throat: Oropharynx is clear and moist. No oropharyngeal exudate.  Eyes: Conjunctivae and EOM are normal. Pupils are equal, round, and reactive to light. No scleral icterus.  Neck: Normal range of motion. Neck supple. No JVD present. No tracheal deviation present. No thyromegaly present.  Cardiovascular: Normal rate, regular rhythm and normal heart sounds.  Exam reveals no gallop and no friction rub.   No murmur heard. Pulmonary/Chest: Effort normal and breath sounds normal. No respiratory distress. She has no wheezes. She exhibits no tenderness.  Abdominal: Soft. Bowel sounds are normal. She exhibits no distension and no mass. There is no tenderness. There is no rebound and no guarding.  Musculoskeletal: Normal range of motion. She exhibits no edema or tenderness.  Tenderness to palpation over the right shoulder. No gross deformity. Normal range of motion. Normal pulses and sensation distally.  Lymphadenopathy:    She has no cervical adenopathy.  Neurological: She is  alert and oriented to person, place, and time. No cranial nerve deficit. She exhibits normal muscle tone.  Normal strength and sensation in all extremities. Normal cerebellar testing.  Skin: Skin is warm and dry. No rash noted. No erythema. No pallor.  Nursing note and vitals reviewed.    ED Treatments / Results  Labs (all labs ordered are listed, but only abnormal results are displayed) Labs Reviewed  CBC WITH DIFFERENTIAL/PLATELET - Abnormal; Notable for the following:       Result Value   WBC 20.1 (*)    RBC 2.90 (*)    Hemoglobin 7.8 (*)    HCT 24.4 (*)    Neutro Abs 18.2 (*)    Lymphs Abs 0.6 (*)    Monocytes Absolute 1.2 (*)    All other components within normal limits  COMPREHENSIVE METABOLIC PANEL - Abnormal; Notable for the following:    Sodium 134 (*)    CO2 21 (*)    Glucose, Bld 211 (*)    BUN 28 (*)    Total Protein 6.1 (*)    Albumin 2.7 (*)    AST 14 (*)    Total Bilirubin 1.5 (*)    All other components within normal limits  URINALYSIS, ROUTINE W REFLEX MICROSCOPIC (NOT AT [facility]) - Abnormal; Notable for the following:    Color, Urine AMBER (*)    APPearance CLOUDY (*)    Bilirubin Urine SMALL (*)    Leukocytes, UA SMALL (*)    All other components within normal limits  URINE MICROSCOPIC-ADD ON - Abnormal; Notable for the following:    Squamous Epithelial / LPF 6-30 (*)    Bacteria, UA RARE (*)    All other components within normal limits  I-STAT CG4 LACTIC ACID, ED - Abnormal; Notable for the following:    Lactic Acid, Venous 3.85 (*)    All other components within normal limits  I-STAT CHEM 8, ED - Abnormal; Notable for the following:    Sodium 134 (*)    Chloride 100 (*)    BUN 24 (*)    Glucose, Bld 209 (*)    Hemoglobin 8.5 (*)    HCT 25.0 (*)    All other components within normal limits  POC OCCULT BLOOD, ED - Abnormal; Notable for the following:  Fecal Occult Bld POSITIVE (*)    All other components within normal limits  URINE CULTURE    LIPASE, BLOOD  MAGNESIUM  TYPE AND SCREEN    EKG  EKG Interpretation None       Radiology Dg Chest 2 View  Result Date: 04/25/2016 CLINICAL DATA:  Right anterior shoulder pain after a fall 1 week ago. Initial encounter. EXAM: CHEST  2 VIEW COMPARISON:  04/05/2016. FINDINGS: Trachea is midline. Heart size normal. Lungs are clear. No pleural fluid. IMPRESSION: No acute findings. Electronically Signed   By: Lorin Picket M.D.   On: 04/25/2016 17:59   Dg Shoulder Right  Result Date: 04/25/2016 CLINICAL DATA:  Right anterior shoulder pain after a fall 1 week ago, initial encounter. EXAM: RIGHT SHOULDER - 2+ VIEW COMPARISON:  None. FINDINGS: No fracture or dislocation. Degenerative changes are seen in the right acromioclavicular joint. Visualized portion of the right chest is unremarkable. IMPRESSION: 1. No acute findings. 2. Right acromioclavicular joint osteoarthritis. Electronically Signed   By: Lorin Picket M.D.   On: 04/25/2016 18:00   Ct Head Wo Contrast  Result Date: 04/25/2016 CLINICAL DATA:  Pt from cancer center, lives at [facility]. RN from cancer center reports pt has failure to thrive , weakness and loe BP reading 85/73 . Pt receives radiation for brain tumor. Per RN pt appears very different from last time was seen. Alert and oriented x 4. RN also reports pt had fall recently, presents with right shoulder "knot ". EXAM: CT HEAD WITHOUT CONTRAST TECHNIQUE: Contiguous axial images were obtained from the base of the skull through the vertex without intravenous contrast. COMPARISON:  04/03/2016 FINDINGS: Brain: Of the previously identified supratentorial and infratentorial metastatic lesions in the brain, 9 of the lesions are visible today. Most of these are hyperdense the and some, including the dominant right basal ganglia lesion measuring 2.8 by 2.7 cm on image 11/2, and the right cerebellar vermis lesion measuring 0.9 by 0.7 cm on image 8/2, probably have hemorrhagic  component. Many of these lesions did have low gradient signal on the recent MRI, favoring a hemorrhagic component. There is some mild dependent density in the occipital horn of the right lateral ventricle and questionably of the left, potentially from intraventricular hemorrhage as shown on prior MRI. The dominant lesion in the right basal ganglia causes 6 mm of right-to-left midline shift, similar to prior. I do not see any definite new lesions compared to the MRI, and not all the lesions visible on MRI are well seen on today's noncontrast CT. No new hemorrhage is identified. Vascular: There is atherosclerotic calcification of the cavernous carotid arteries bilaterally. Skull: Unremarkable Sinuses/Orbits: Unremarkable Other: No supplemental non-categorized findings. IMPRESSION: 1. Essentially stable distribution and appearance of hemorrhagic metastatic disease to the brain. The dominant lesion measures 2.8 cm and is in the right basal ganglia, causing 6 mm of focal right to left midline shift as it did previously. Trace amount of intraventricular hemorrhage. Nine of the previously identified metastatic lesions are visible today. I do not see any new lesions or new hemorrhage. Electronically Signed   By: Van Clines M.D.   On: 04/25/2016 20:16    Procedures Procedures (including critical care time)  Medications Ordered in ED Medications  vancomycin (VANCOCIN) 1,500 mg in sodium chloride 0.9 % 500 mL IVPB (1,500 mg Intravenous New Bag/Given 04/25/16 1830)  morphine 4 MG/ML injection 6 mg (6 mg Intravenous Given 04/25/16 1659)  sodium chloride 0.9 % bolus 1,000 mL (  1,000 mLs Intravenous New Bag/Given 04/25/16 1659)  piperacillin-tazobactam (ZOSYN) IVPB 3.375 g (0 g Intravenous Stopped 04/25/16 1831)     Initial Impression / Assessment and Plan / ED Course  I have reviewed the triage vital signs and the nursing notes.  Pertinent labs & imaging results that were available during my care of the patient  were reviewed by me and considered in my medical decision making (see chart for details).  Clinical Course    Patient presents emergency department for hypotension and altered mental status. We'll obtain laboratory studies and infectious workup. Patient given IV fluids and morphine for pain control. We'll also x-ray her right shoulder for this new onset pain. Current blood pressure is 118/72, improved from her nursing facility.  8:23 PM Lactate is 3.85, WBC is up to 20.  Will treat with broad spectrum abx.  CXR and UA still pending.  I anticipate admission  8:23 PM CXR and UA are negative for infection.  BP improved after fluids and abx.  Will consult with hospitalist.  8:23 PM Dr. Lurene Shadow for CT head prior to admission.  This was ordered.  8:23 PM CT head shows stable appearance of hemorrhagic cancer.  Stable shift as well.  Will admit to tele.  9:33 PM I spoke with Dr. Christella Noa and he agrees there is nothing acute to do from neurosurgical stand point.  CT shows all stable findings.    Final Clinical Impressions(s) / ED Diagnoses   Final diagnoses:  None    New Prescriptions New Prescriptions   No medications on file        Everlene Balls, MD 04/25/16 2134

## 2016-04-25 NOTE — ED Notes (Signed)
Pt in CT.

## 2016-04-25 NOTE — Progress Notes (Addendum)
Weekly rad txs whole brain, and left lung, c/o nausea, and right shoulder pain, upper arm has a knot there, she had fallen 1 week ago, no bruising seen, has had nausea medication at [facility], unable to stand to get weighed, too weak, in w/c, poor appetite, eating 1 bite of brakfast, drinking diet cola,gingerale,tea,  Sugar free boost shakes, no skin changes seen, poor appetite, decadron '4mg'$  bid, looks like thrush starting on her tongue 2:33 PM BP (!) 85/73 (BP Location: Left Arm, Patient Position: Sitting, Cuff Size: Normal)   Pulse (!) 107   Temp 98.6 F (37 C) (Oral)   Resp 20   SpO2 98% Comment: room air  Wt Readings from Last 3 Encounters:  04/13/16 167 lb 6.4 oz (75.9 kg)  04/01/16 179 lb 0.2 oz (81.2 kg)  02/23/16 182 lb (82.6 kg)

## 2016-04-25 NOTE — ED Triage Notes (Signed)
Pt from cancer center, lives at [facility]. RN from cancer center reports pt has failure to thrive , weakness and loe BP reading 85/73 . Pt receives radiation for brain tumor. Per RN pt appears very different from last time was seen. Alert and oriented x 4. RN also reports pt had fall recently, presents with right shoulder "knot ".

## 2016-04-25 NOTE — Progress Notes (Signed)
Called [facility] and spoke with patient's nurse Stanton Kidney, their decadron dose age hasn't been changed to '4mg'$  bid, have sent faxes, and original rx with patient last week, and 2 different weeks faxed copy of RX, Stanton Kidney, stated she needs an order she is new and doesn't have  That order, refaxed for third time at 325 to [phone], informed Stanton Kidney patient is being sent to the ED 3:31 PM .b

## 2016-04-25 NOTE — Progress Notes (Signed)
Department of Radiation Oncology  Phone:  [phone] Fax:        [phone]  Weekly Treatment Note    Name: [name] Date: 04/25/2016 MRN: [id number] DOB: 01-Dec-1950   Diagnosis:     ICD-9-CM ICD-10-CM   1. Brain metastases (HCC) 198.3 C79.31   2. 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    Current Outpatient Prescriptions  Medication Sig Dispense Refill  . acetaminophen (TYLENOL) 500 MG tablet Take 500 mg by mouth every 6 (six) hours as needed for mild pain.     Marland Kitchen albuterol (PROAIR HFA) 108 (90 BASE) MCG/ACT inhaler Inhale 1 puff into the lungs every 6 (six) hours as needed for wheezing or shortness of breath.    . B-Complex TABS Take 1 tablet by mouth daily.    . Biotin 5000 MCG CAPS Take 1 capsule by mouth 2 (two) times daily.    . budesonide-formoterol (SYMBICORT) 160-4.5 MCG/ACT inhaler Inhale 2 puffs into the lungs 2 (two) times daily.    . Calcium-Magnesium-Vitamin D 200-100-33.3 MG-MG-UNIT CAPS Take 1 capsule by mouth daily.    . clobetasol (TEMOVATE) 0.05 % cream Apply 1 application topically 2 (two) times daily.     Marland Kitchen dexamethasone (DECADRON) 4 MG tablet Take 1 tablet (4 mg total) by mouth every 8 (eight) hours. (Patient taking differently: Take 4 mg by mouth 2 (two) times daily. ) 30 tablet 0  . DHA-EPA-Flaxseed Oil-Vitamin E (THERA TEARS NUTRITION PO) Take 1 drop by mouth daily.    Marland Kitchen glipiZIDE (GLUCOTROL) 10 MG tablet Take 10 mg by mouth 2 (two) times daily.      Marland Kitchen HYDROcodone-acetaminophen (NORCO/VICODIN) 5-325 MG tablet Take 1 tablet by mouth 2 (two) times daily as needed for moderate pain. 5 tablet 0  . insulin aspart (NOVOLOG) 100 UNIT/ML injection Inject 0-20 Units into the skin 3 (three) times daily with meals. (Patient taking differently: Inject 0-9 Units into the skin 3 (three) times  daily with meals. 60-100 = 0 units 101-150 = 1 unit  151-200 = 2 units 201-250 = 3 units 251-300 = 5 units 301-350 = 7 units > 350 = 9 units > 400 CALL MD) 10 mL 0  . Insulin Glargine (LANTUS SOLOSTAR) 100 UNIT/ML Solostar Pen Inject 15 Units into the skin daily at 10 pm. 15 mL 0  . LORazepam (ATIVAN) 0.5 MG tablet Take 1 tablet (0.5 mg total) by mouth every 8 (eight) hours as needed for anxiety or sleep. 5 tablet 0  . metFORMIN (GLUCOPHAGE) 850 MG tablet Take 850 mg by mouth 2 (two) times daily with a meal.     . metoprolol succinate (TOPROL XL) 25 MG 24 hr tablet Take 1 tablet (25 mg) in the am, and 1/2 tablet (12.5 mg) at bedtime (Patient taking differently: Take 12.5-25 mg by mouth 2 (two) times daily. Takes '25mg'$  in the morning and 12.'5mg'$  at bedtime) 135 tablet 3  . Multiple Vitamin (MULITIVITAMIN WITH MINERALS) TABS Take 1 tablet by mouth daily.    . Omega-3 Fatty Acids (FISH OIL) 1000 MG CAPS Take 1 capsule by mouth 2 (two) times daily.     Marland Kitchen omeprazole (PRILOSEC) 20 MG capsule Take 20 mg by mouth every other day.     . prochlorperazine (COMPAZINE) 10 MG tablet Take 1 tablet (10 mg total) by mouth every  6 (six) hours as needed for nausea or vomiting. 30 tablet 0  . simvastatin (ZOCOR) 20 MG tablet Take 20 mg by mouth every evening.    . tacrolimus (PROTOPIC) 0.1 % ointment Apply 1 application topically 2 (two) times daily.    . Wound Dressings (SONAFINE) Apply 1 application topically daily. Apply after radiation daily on scalp once skin becomes irritated, red,itchy     Facility-Administered Medications Ordered in Other Encounters  Medication Dose Route Frequency Provider Last Rate Last Dose  . piperacillin-tazobactam (ZOSYN) IVPB 3.375 g  3.375 g Intravenous Once Everlene Balls, MD 100 mL/hr at 04/25/16 1801 3.375 g at 04/25/16 1801  . vancomycin (VANCOCIN) 1,500 mg in sodium chloride 0.9 % 500 mL IVPB  1,500 mg Intravenous STAT Everlene Balls, MD         ALLERGIES: Review of patient's  allergies indicates no known allergies.   LABORATORY DATA:  Lab Results  Component Value Date   WBC 20.1 (H) 04/25/2016   HGB 8.5 (L) 04/25/2016   HCT 25.0 (L) 04/25/2016   MCV 84.1 04/25/2016   PLT 161 04/25/2016   Lab Results  Component Value Date   NA 134 (L) 04/25/2016   K 3.9 04/25/2016   CL 100 (L) 04/25/2016   CO2 21 (L) 04/25/2016   Lab Results  Component Value Date   ALT 16 04/25/2016   AST 14 (L) 04/25/2016   ALKPHOS 77 04/25/2016   BILITOT 1.5 (H) 04/25/2016     NARRATIVE: [name] was seen today for weekly treatment management. The chart was checked and the patient's films were reviewed.  Called [facility] toplet staff know we are snding the patient to the Emergency room, low  Blood pressure, need of IVF'S, nauseated, right shoulder pain, wekness fatigue 6:29 PM  Called [facility] and spoke with patient's nurse Stanton Kidney, their decadron dose age hasn't been changed to '4mg'$  bid, have sent faxes, and original rx with patient last week, and 2 different weeks faxed copy of RX, Stanton Kidney, stated she needs an order she is new and doesn't have  That order, refaxed for third time at 325 to [phone], informed Stanton Kidney patient is being sent to the ED 6:28 PM  The patient is not feeling very well today at all. Some nausea but performance status has really declined.  PHYSICAL EXAMINATION: oral temperature is 98.6 F (37 C). Her blood pressure is 85/73 (abnormal) and her pulse is 107 (abnormal). Her respiration is 20 and oxygen saturation is 98%.        ASSESSMENT: The patient is doing satisfactorily with treatment without significant complaints of esophagitis currently. However, the patient is not feeling very well at all with low blood pressure and her performance status overall has declined. The patient needs further workup and may need admission depending on her results.  PLAN: The patient is going to be sent to the emergency room. We will follow up on the patient's  results. She has one more fraction remaining to the lungs. She has completed her course of cranial radiation last week.

## 2016-04-25 NOTE — H&P (Signed)
History and Physical    [name] JTT:[id number] DOB: [birth year] DOA: 04/25/2016  PCP: Robert Bellow, MD  Patient coming from: [place].  Chief Complaint: Weakness.  HPI: [name] is a 65 y.o. 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 As per patient and patient's daughter patient has been having poor appetite over the last few days. Has not been eating well. Has had a fall 2 weeks ago after which patient still has pain in the right shoulder. Labs reveal leukocytosis with elevated lactic acid levels. Blood cultures were obtained and fluid bolus was given. Patient's hemoglobin has dropped by 2-3 g from previous. Stool for occult blood has been positive. Patient is being admitted for weakness and hypotension and possible GI bleed. Patient has such has not noticed any definite GI bleed. Patient does not take any NSAIDs or Goody powder. Patient is on Decadron for her known metastatic brain lesions. CT head does not show anything acute.   ED Course: Fluid bolus was given and started on Protonix infusion for GI bleed. Type and screen. Empiric antibiotics were started for possible sepsis. UA shows features consistent with UTI.  Review of Systems: As per HPI, rest all negative.   Past Medical History:  Diagnosis Date  . Anxiety   . Asthma   . Bronchitis   . Depression   . Essential hypertension   . GERD (gastroesophageal reflux disease)   . Hyperlipidemia   . Lichen sclerosus   . Osteoarthritis   . SVT (supraventricular tachycardia) (HCC)    No inducible arrhythmias by EP study 11/11  . Type 2 diabetes mellitus ([place])     Past Surgical History:  Procedure Laterality Date  . CESAREAN SECTION WITH BILATERAL TUBAL LIGATION  1978  . CHOLECYSTECTOMY  2007  . EP study  11/11     reports that she has quit smoking. Her  smoking use included Cigarettes. She has never used smokeless tobacco. She reports that she does not drink alcohol or use drugs.  No Known Allergies  Family History  Problem Relation Age of Onset  . Hypertension Father   . COPD Father   . Parkinson's disease Father   . Hypertension Mother     Prior to Admission medications   Medication Sig Start Date End Date Taking? Authorizing Provider  acetaminophen (TYLENOL) 500 MG tablet Take 500 mg by mouth every 6 (six) hours as needed for mild pain.    Yes Historical Provider, MD  albuterol (PROAIR HFA) 108 (90 BASE) MCG/ACT inhaler Inhale 1 puff into the lungs every 6 (six) hours as needed for wheezing or shortness of breath.   Yes Historical Provider, MD  B-Complex TABS Take 1 tablet by mouth daily.   Yes Historical Provider, MD  Biotin 5000 MCG CAPS Take 1 capsule by mouth 2 (two) times daily.   Yes Historical Provider, MD  budesonide-formoterol (SYMBICORT) 160-4.5 MCG/ACT inhaler Inhale 2 puffs into the lungs 2 (two) times daily.   Yes Historical Provider, MD  Calcium-Magnesium-Vitamin D 200-100-33.3 MG-MG-UNIT CAPS Take 1 capsule by mouth daily.   Yes Historical Provider, MD  clobetasol (TEMOVATE) 0.05 % cream Apply 1 application topically 2 (two) times daily.    Yes Historical Provider, MD  dexamethasone (DECADRON) 4 MG tablet Take 1 tablet (4 mg total) by mouth every 8 (eight) hours. Patient taking differently: Take 4 mg  by mouth 2 (two) times daily.  04/07/16  Yes Lavina Hamman, MD  DHA-EPA-Flaxseed Oil-Vitamin E (THERA TEARS NUTRITION PO) Take 1 drop by mouth daily.   Yes Historical Provider, MD  glipiZIDE (GLUCOTROL) 10 MG tablet Take 10 mg by mouth 2 (two) times daily.     Yes Historical Provider, MD  HYDROcodone-acetaminophen (NORCO/VICODIN) 5-325 MG tablet Take 1 tablet by mouth 2 (two) times daily as needed for moderate pain. 04/07/16  Yes Lavina Hamman, MD  insulin aspart (NOVOLOG) 100 UNIT/ML injection Inject 0-20 Units into the skin  3 (three) times daily with meals. Patient taking differently: Inject 0-9 Units into the skin 3 (three) times daily with meals. 60-100 = 0 units 101-150 = 1 unit  151-200 = 2 units 201-250 = 3 units 251-300 = 5 units 301-350 = 7 units > 350 = 9 units > 400 CALL MD 04/07/16  Yes Lavina Hamman, MD  Insulin Glargine (LANTUS SOLOSTAR) 100 UNIT/ML Solostar Pen Inject 15 Units into the skin daily at 10 pm. 04/07/16  Yes Lavina Hamman, MD  LORazepam (ATIVAN) 0.5 MG tablet Take 1 tablet (0.5 mg total) by mouth every 8 (eight) hours as needed for anxiety or sleep. 04/07/16  Yes Lavina Hamman, MD  metFORMIN (GLUCOPHAGE) 850 MG tablet Take 850 mg by mouth 2 (two) times daily with a meal.    Yes Historical Provider, MD  metoprolol succinate (TOPROL XL) 25 MG 24 hr tablet Take 1 tablet (25 mg) in the am, and 1/2 tablet (12.5 mg) at bedtime Patient taking differently: Take 12.5-25 mg by mouth 2 (two) times daily. Takes '25mg'$  in the morning and 12.'5mg'$  at bedtime 01/24/16  Yes Satira Sark, MD  Multiple Vitamin (MULITIVITAMIN WITH MINERALS) TABS Take 1 tablet by mouth daily.   Yes Historical Provider, MD  Omega-3 Fatty Acids (FISH OIL) 1000 MG CAPS Take 1 capsule by mouth 2 (two) times daily.    Yes Historical Provider, MD  omeprazole (PRILOSEC) 20 MG capsule Take 20 mg by mouth every other day.    Yes Historical Provider, MD  prochlorperazine (COMPAZINE) 10 MG tablet Take 1 tablet (10 mg total) by mouth every 6 (six) hours as needed for nausea or vomiting. 04/13/16  Yes Kyung Rudd, MD  simvastatin (ZOCOR) 20 MG tablet Take 20 mg by mouth every evening.   Yes Historical Provider, MD  tacrolimus (PROTOPIC) 0.1 % ointment Apply 1 application topically 2 (two) times daily.   Yes Historical Provider, MD  Wound Dressings (SONAFINE) Apply 1 application topically daily. Apply after radiation daily on scalp once skin becomes irritated, red,itchy 04/06/16  Yes Historical Provider, MD    Physical Exam: Vitals:    04/25/16 2115 04/25/16 2121 04/25/16 2130 04/25/16 2230  BP:  104/64 111/74 (!) 125/52  Pulse: 74 72  86  Resp: '13 14 15 15  '$ Temp:      TempSrc:      SpO2: 95% 98%  96%      Constitutional: Not in distress. Vitals:   04/25/16 2115 04/25/16 2121 04/25/16 2130 04/25/16 2230  BP:  104/64 111/74 (!) 125/52  Pulse: 74 72  86  Resp: '13 14 15 15  '$ Temp:      TempSrc:      SpO2: 95% 98%  96%   Eyes: Anicteric no pallor. ENMT: No discharge from the ears eyes nose or mouth. Neck: No mass felt. Respiratory: No rhonchi or crepitations. Cardiovascular: S1 and S2 heard. Abdomen: Soft nontender bowel  sounds present. No guarding or rigidity. Musculoskeletal: No edema. Skin: No rash. Neurologic: Alert awake oriented to time place and person. Moves all extremities. Psychiatric: Appears normal.   Labs on Admission: I have personally reviewed following labs and imaging studies  CBC:  Recent Labs Lab 04/25/16 1700 04/25/16 1708  WBC 20.1*  --   NEUTROABS 18.2*  --   HGB 7.8* 8.5*  HCT 24.4* 25.0*  MCV 84.1  --   PLT 161  --    Basic Metabolic Panel:  Recent Labs Lab 04/25/16 1700 04/25/16 1708  NA 134* 134*  K 3.9 3.9  CL 104 100*  CO2 21*  --   GLUCOSE 211* 209*  BUN 28* 24*  CREATININE 0.75 0.70  CALCIUM 8.9  --   MG 1.8  --    GFR: Estimated Creatinine Clearance: 68.6 mL/min (by C-G formula based on SCr of 0.8 mg/dL). Liver Function Tests:  Recent Labs Lab 04/25/16 1700  AST 14*  ALT 16  ALKPHOS 77  BILITOT 1.5*  PROT 6.1*  ALBUMIN 2.7*    Recent Labs Lab 04/25/16 1700  LIPASE 19   No results for input(s): AMMONIA in the last 168 hours. Coagulation Profile: No results for input(s): INR, PROTIME in the last 168 hours. Cardiac Enzymes: No results for input(s): CKTOTAL, CKMB, CKMBINDEX, TROPONINI in the last 168 hours. BNP (last 3 results) No results for input(s): PROBNP in the last 8760 hours. HbA1C: No results for input(s): HGBA1C in the last 72  hours. CBG: No results for input(s): GLUCAP in the last 168 hours. Lipid Profile: No results for input(s): CHOL, HDL, LDLCALC, TRIG, CHOLHDL, LDLDIRECT in the last 72 hours. Thyroid Function Tests: No results for input(s): TSH, T4TOTAL, FREET4, T3FREE, THYROIDAB in the last 72 hours. Anemia Panel: No results for input(s): VITAMINB12, FOLATE, FERRITIN, TIBC, IRON, RETICCTPCT in the last 72 hours. Urine analysis:    Component Value Date/Time   COLORURINE AMBER (A) 04/25/2016 1613   APPEARANCEUR CLOUDY (A) 04/25/2016 1613   LABSPEC 1.027 04/25/2016 1613   PHURINE 5.5 04/25/2016 1613   GLUCOSEU NEGATIVE 04/25/2016 1613   HGBUR NEGATIVE 04/25/2016 1613   BILIRUBINUR SMALL (A) 04/25/2016 1613   KETONESUR NEGATIVE 04/25/2016 1613   PROTEINUR NEGATIVE 04/25/2016 1613   UROBILINOGEN 0.2 01/21/2012 1145   NITRITE NEGATIVE 04/25/2016 1613   LEUKOCYTESUR SMALL (A) 04/25/2016 1613   Sepsis Labs: '@LABRCNTIP'$ (procalcitonin:4,lacticidven:4) )No results found for this or any previous visit (from the past 240 hour(s)).   Radiological Exams on Admission: Dg Chest 2 View  Result Date: 04/25/2016 CLINICAL DATA:  Right anterior shoulder pain after a fall 1 week ago. Initial encounter. EXAM: CHEST  2 VIEW COMPARISON:  04/05/2016. FINDINGS: Trachea is midline. Heart size normal. Lungs are clear. No pleural fluid. IMPRESSION: No acute findings. Electronically Signed   By: Lorin Picket M.D.   On: 04/25/2016 17:59   Dg Shoulder Right  Result Date: 04/25/2016 CLINICAL DATA:  Right anterior shoulder pain after a fall 1 week ago, initial encounter. EXAM: RIGHT SHOULDER - 2+ VIEW COMPARISON:  None. FINDINGS: No fracture or dislocation. Degenerative changes are seen in the right acromioclavicular joint. Visualized portion of the right chest is unremarkable. IMPRESSION: 1. No acute findings. 2. Right acromioclavicular joint osteoarthritis. Electronically Signed   By: Lorin Picket M.D.   On: 04/25/2016 18:00    Ct Head Wo Contrast  Result Date: 04/25/2016 CLINICAL DATA:  Pt from cancer center, lives at [place]. RN from cancer center reports pt  has failure to thrive , weakness and loe BP reading 85/73 . Pt receives radiation for brain tumor. Per RN pt appears very different from last time was seen. Alert and oriented x 4. RN also reports pt had fall recently, presents with right shoulder "knot ". EXAM: CT HEAD WITHOUT CONTRAST TECHNIQUE: Contiguous axial images were obtained from the base of the skull through the vertex without intravenous contrast. COMPARISON:  04/03/2016 FINDINGS: Brain: Of the previously identified supratentorial and infratentorial metastatic lesions in the brain, 9 of the lesions are visible today. Most of these are hyperdense the and some, including the dominant right basal ganglia lesion measuring 2.8 by 2.7 cm on image 11/2, and the right cerebellar vermis lesion measuring 0.9 by 0.7 cm on image 8/2, probably have hemorrhagic component. Many of these lesions did have low gradient signal on the recent MRI, favoring a hemorrhagic component. There is some mild dependent density in the occipital horn of the right lateral ventricle and questionably of the left, potentially from intraventricular hemorrhage as shown on prior MRI. The dominant lesion in the right basal ganglia causes 6 mm of right-to-left midline shift, similar to prior. I do not see any definite new lesions compared to the MRI, and not all the lesions visible on MRI are well seen on today's noncontrast CT. No new hemorrhage is identified. Vascular: There is atherosclerotic calcification of the cavernous carotid arteries bilaterally. Skull: Unremarkable Sinuses/Orbits: Unremarkable Other: No supplemental non-categorized findings. IMPRESSION: 1. Essentially stable distribution and appearance of hemorrhagic metastatic disease to the brain. The dominant lesion measures 2.8 cm and is in the right basal ganglia, causing 6 mm  of focal right to left midline shift as it did previously. Trace amount of intraventricular hemorrhage. Nine of the previously identified metastatic lesions are visible today. I do not see any new lesions or new hemorrhage. Electronically Signed   By: Van Clines M.D.   On: 04/25/2016 20:16    EKG: Independently reviewed. Normal sinus rhythm.  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 Acute GI bleed - possibly upper GI. Patient is placed on Protonix infusion and follow CBC closely. If hemoglobin falls less than 7 will transfuse. Consult gastroenterologist in a.m. Place patient nothing by mouth in anticipation of EGD. 2. Acute blood loss anemia - follow CBC. 3. Hypotension probably from dehydration and blood loss - continue hydration and follow CBC. IV Decadron for now which will also help with stress dose. Not looking septic. Follow blood cultures. Patient on empiric antibiotics. Follow lactate levels. 4. Leukocytosis and UTI - patient is on empiric antibiotics. Follow blood cultures and urine cultures. May taper of antibiotics once cultures results are available. Leukocytosis could be from steroid use. 5. Lung cancer with metastases to the brain - receiving radiation therapy. Placed on IV Decadron until patient can take orally. 6. Diabetes mellitus type 2 - will hold off antidiabetic medications until patient can take reliably orally. On sliding-scale coverage. 7. Hypertension - holding antihypertensives secondary to hypotension.   DVT prophylaxis: SCDs. Code Status: Do not defibrillate.  Family Communication: Patient's daughter.  Disposition Plan: To be determined.  Consults called: None.  Admission status: Inpatient. Telemetry.    [name] MD [facility] Pager [phone].  If 7PM-7AM, please contact night-coverage [email or web address] Password [email or web address]  04/25/2016, 10:55 PM

## 2016-04-25 NOTE — Telephone Encounter (Signed)
Called [facility] toplet staff know we are snding the patient to the Emergency room, low  Blood pressure, need of IVF'S, nauseated, right shoulder pain, wekness fatigue 3:19 PM

## 2016-04-25 NOTE — ED Notes (Signed)
Bed: WA17 Expected date:  Expected time:  Means of arrival:  Comments: Pt from [place]

## 2016-04-25 NOTE — Progress Notes (Signed)
Pharmacy Antibiotic Note  [name] is a 65 y.o. female admitted on 04/25/2016 with sepsis.  Pharmacy has been consulted for cefepime/vancomycin dosing.  Plan: Cefepime 1Gm IV q8h Vancomycin '1500mg'$  x1 (ED) then 1Gm IV q12h  VT=15-20 mg/L  Height: '5\' 2"'$  (157.5 cm) Weight: 160 lb 0.9 oz (72.6 kg) IBW/kg (Calculated) : 50.1  Temp (24hrs), Avg:98.4 F (36.9 C), Min:98.1 F (36.7 C), Max:98.6 F (37 C)   Recent Labs Lab 04/25/16 1700 04/25/16 1707 04/25/16 1708  WBC 20.1*  --   --   CREATININE 0.75  --  0.70  LATICACIDVEN  --  3.85*  --     Estimated Creatinine Clearance: 67.2 mL/min (by C-G formula based on SCr of 0.8 mg/dL).    No Known Allergies  Antimicrobials this admission: 9/6 zosyn >> x1 ED 9/6 vancomycin >>  97 cefepime >>  Dose adjustmens this admission:   Microbiology results:  BCx:   UCx:    Sputum:    MRSA PCR:   Thank you for allowing pharmacy to be a part of this patient's care.  Dorrene German 04/25/2016 11:19 PM

## 2016-04-25 NOTE — ED Notes (Signed)
MD at bedside. 

## 2016-04-26 ENCOUNTER — Ambulatory Visit: Payer: PPO

## 2016-04-26 ENCOUNTER — Encounter (HOSPITAL_COMMUNITY): Payer: Self-pay

## 2016-04-26 ENCOUNTER — Encounter: Payer: Self-pay | Admitting: Radiation Oncology

## 2016-04-26 ENCOUNTER — Inpatient Hospital Stay (HOSPITAL_COMMUNITY): Payer: PPO

## 2016-04-26 DIAGNOSIS — K2961 Other gastritis with bleeding: Secondary | ICD-10-CM

## 2016-04-26 LAB — BASIC METABOLIC PANEL
ANION GAP: 7 (ref 5–15)
BUN: 21 mg/dL — ABNORMAL HIGH (ref 6–20)
CHLORIDE: 109 mmol/L (ref 101–111)
CO2: 21 mmol/L — ABNORMAL LOW (ref 22–32)
Calcium: 8 mg/dL — ABNORMAL LOW (ref 8.9–10.3)
Creatinine, Ser: 0.57 mg/dL (ref 0.44–1.00)
GFR calc non Af Amer: >60 mL/min (ref >60)
Glucose, Bld: 210 mg/dL — ABNORMAL HIGH (ref 65–99)
POTASSIUM: 3.8 mmol/L (ref 3.5–5.1)
SODIUM: 137 mmol/L (ref 135–145)

## 2016-04-26 LAB — GLUCOSE, CAPILLARY
GLUCOSE-CAPILLARY: 210 mg/dL — AB (ref 65–99)
GLUCOSE-CAPILLARY: 176 mg/dL — AB (ref 65–99)
Glucose-Capillary: 216 mg/dL — ABNORMAL HIGH (ref 65–99)

## 2016-04-26 LAB — CBC
HEMATOCRIT: 22.7 % — AB (ref 36.0–46.0)
Hemoglobin: 7.4 g/dL — ABNORMAL LOW (ref 12.0–15.0)
MCH: 27.7 pg (ref 26.0–34.0)
MCHC: 32.6 g/dL (ref 30.0–36.0)
MCV: 85 fL (ref 78.0–100.0)
PLATELETS: 120 10*3/uL — AB (ref 150–400)
RBC: 2.67 MIL/uL — AB (ref 3.87–5.11)
RDW: 15 % (ref 11.5–15.5)
WBC: 15.2 10*3/uL — AB (ref 4.0–10.5)

## 2016-04-26 LAB — PREPARE RBC (CROSSMATCH)

## 2016-04-26 LAB — MRSA PCR SCREENING: MRSA BY PCR: NEGATIVE

## 2016-04-26 MED ORDER — ORAL CARE MOUTH RINSE
15.0000 mL | Freq: Two times a day (BID) | OROMUCOSAL | Status: DC
  Administered 2016-04-26 – 2016-05-02 (×11): 15 mL via OROMUCOSAL

## 2016-04-26 MED ORDER — INSULIN ASPART 100 UNIT/ML ~~LOC~~ SOLN
4.0000 [IU] | Freq: Once | SUBCUTANEOUS | Status: AC
  Administered 2016-04-26: 4 [IU] via SUBCUTANEOUS

## 2016-04-26 MED ORDER — SODIUM CHLORIDE 0.9 % IV SOLN: Freq: Once | INTRAVENOUS | Status: DC

## 2016-04-26 MED ORDER — LORAZEPAM 2 MG/ML IJ SOLN: 0.5000 mg | Freq: Three times a day (TID) | INTRAMUSCULAR | Status: DC | PRN

## 2016-04-26 MED ORDER — MAGIC MOUTHWASH
5.0000 mL | Freq: Four times a day (QID) | ORAL | Status: DC
  Administered 2016-04-26 – 2016-05-02 (×23): 5 mL via ORAL
  Filled 2016-04-26 (×26): qty 5

## 2016-04-26 MED ORDER — MORPHINE SULFATE (PF) 2 MG/ML IV SOLN
2.0000 mg | INTRAVENOUS | Status: DC | PRN
  Administered 2016-04-26 – 2016-05-02 (×27): 2 mg via INTRAVENOUS
  Filled 2016-04-26 (×27): qty 1

## 2016-04-26 MED ORDER — INSULIN GLARGINE 100 UNIT/ML ~~LOC~~ SOLN
8.0000 [IU] | Freq: Every day | SUBCUTANEOUS | Status: DC
  Administered 2016-04-26 – 2016-04-27 (×2): 8 [IU] via SUBCUTANEOUS
  Filled 2016-04-26 (×2): qty 0.08

## 2016-04-26 NOTE — Progress Notes (Deleted)
Message left with Hoyle Sauer at [facility] to inform Arlee Muslim, R-PAC that patient refused Dexamethasone due to his diabetes. Call back phone number left with Hoyle Sauer.

## 2016-04-26 NOTE — Progress Notes (Signed)
CRITICAL VALUE ALERT  Critical value received:  Hgb 6.7  Date of notification: 04/25/16  Time of notification:  2347  Critical value read back:Yes.    Nurse who received alert:  Reynold Bowen, RN  MD notified (1st page):  Baltazar Najjar  Time of first page:  2352  MD notified (2nd page):  Time of second page:  Responding MD:  Baltazar Najjar  Time MD responded:  Orders put in to transfuse 1 unit

## 2016-04-26 NOTE — Progress Notes (Addendum)
Patient ID: [name], female   DOB: 1950/11/05, 65 y.o.   MRN: [id number]    PROGRESS NOTE    [name]  IHW:[id number] DOB: 03/27/1951 DOA: 04/25/2016  PCP: Robert Bellow, MD   Brief Narrative:  65 y.o. 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 As per patient and patient's daughter patient has been having poor appetite over the last few days. Has not been eating well. Has had a fall 2 weeks ago after which patient still has pain in the right shoulder. Labs reveal leukocytosis with elevated lactic acid levels.   Assessment & Plan:   1. Acute GI bleed, anemia of chronic disease, thrombocytopenia  - unclear etiology, pt had one U PRBC transfused on admission, Hg up from 6.7 --> 7.4 - FOBT +, discussed with GI on call PA Zehr and her attending Dr. Henrene Pastor - recommend conservative management given metastatic lung cancer, also PPI BID  - will repeat Hg in AM, if still < 7.5, plan on transfusing additional unit of PRBC   2. Sepsis of unclear etiology  - please note that pt met criteria for sepsis on admission with WBC 20 K, HR up to 107, immunocompromised pt and with SBP in 80's - BP stabilized after > 2 L NS and source thought to be urinary - blood cultures have unfortunately not been collected - pt was however started on broad spectrum abx vanc and maxipime which I agree with, will continue same regimen day #2 - if pt starts spiking fever, would obtain blood cultures  - follow up on urine cultures   3. Lung mass with brain metastasis  - 3.7 cm mass noted in medial LUL on CT with invasion into mediastinum (recent CT in August 2017) - Suspected to be a primary bronchogenic carcinoma  - Pt endorses FHx of lung cancer and has significant secondary smoke exposure from husband - pt was advised to follow up and establish care with Dr.  Julien Nordmann - will send him a message through [facility] order set to notify  - Multiple brain hemorrhages also noted on recent MRI in August 2017, largest of which is 2.8 x 2.5 x 2.6 cm at the genu of Rt internal capsule with mass effect on the third ventricle and midline shift of 7 mm  - pt follows with Dr. Lisbeth Renshaw for radiation therapy and has radiation treatment scheduled today  - pt has refused dexamethasone, will respect wishes   4. Renal masses  - Bilateral renal masses noted on recent CT also in august 2017, possibly metastatic  - Abdomen MRI with & without contrast in August 2017 was notable for enhancing intramuscular lesions most consistent with metastasis, presumably from the patient's primary lung cancer.  5. Type II DM  - A1c was 7.2% in 2011  - Managed at home with insulin lantus and metformin - will keep on SSI until oral intake improved  - Check CBG q4h; change to with meals and qHS once appropriate for a diet  - Moderate-intensity SSI correctional to start; will adjust prn   6. Right shoulder pain  - after recent fall - MRI of the shoulder pending to rule out pathologic or non pathologic fracture   7. GERD - GI doctor recommended PPI BID    DVT prophylaxis: SCD's  Code Status: Full  Family Communication: Family updated at bedside (daughter) Disposition Plan:  to be determined   Consultants:   GI (over the phone)  Procedures:   None  Antimicrobials:   None   Subjective: No events overnight, pt reports being still tired but better since admission.   Objective: Vitals:   04/26/16 0250 04/26/16 0450 04/26/16 1013 04/26/16 1457  BP: 105/62 109/66  123/62  Pulse: 74 81 78 96  Resp: _0 Temp: 98 F (36.7 C) 99 F (37.2 C)    TempSrc: Axillary Axillary    SpO2: 97% 99% 96% 93%  Weight:      Height:        Intake/Output Summary (Last 24 hours) at 04/26/16 1901 Last data filed at 04/26/16 1552  Gross per 24 hour  Intake           928.75 ml    Output              275 ml  Net           653.75 ml   Filed Weights   04/25/16 2316  Weight: 72.6 kg (160 lb 0.9 oz)    Examination:  General exam: Appears calm and comfortable  Respiratory system: Respiratory effort normal. Cardiovascular system: S1 & S2 heard, RRR. No JVD, murmurs, rubs, gallops or clicks. No pedal edema. Gastrointestinal system: Abdomen is nondistended, soft and nontender. No organomegaly or masses felt.  Central nervous system: Alert and oriented. No focal neurological deficits.  Data Reviewed: I have personally reviewed following labs and imaging studies  CBC:  Recent Labs Lab 04/25/16 1700 04/25/16 1708 04/25/16 2315 04/26/16 0710  WBC 20.1*  --  17.5* 15.2*  NEUTROABS 18.2*  --   --   --   HGB 7.8* 8.5* 6.7* 7.4*  HCT 24.4* 25.0* 20.9* 22.7*  MCV 84.1  --  87.1 85.0  PLT 161  --  141* 419*   Basic Metabolic Panel:  Recent Labs Lab 04/25/16 1700 04/25/16 1708 04/26/16 0710  NA 134* 134* 137  K 3.9 3.9 3.8  CL 104 100* 109  CO2 21*  --  21*  GLUCOSE 211* 209* 210*  BUN 28* 24* 21*  CREATININE 0.75 0.70 0.57  CALCIUM 8.9  --  8.0*  MG 1.8  --   --    Liver Function Tests:  Recent Labs Lab 04/25/16 1700  AST 14*  ALT 16  ALKPHOS 77  BILITOT 1.5*  PROT 6.1*  ALBUMIN 2.7*    Recent Labs Lab 04/25/16 1700  LIPASE 19   CBG:  Recent Labs Lab 04/25/16 2347 04/26/16 0422 04/26/16 0738 04/26/16 1256  GLUCAP 212* 210* 216* 176*   Urine analysis:    Component Value Date/Time   COLORURINE AMBER (A) 04/25/2016 1613   APPEARANCEUR CLOUDY (A) 04/25/2016 1613   LABSPEC 1.027 04/25/2016 1613   PHURINE 5.5 04/25/2016 1613   GLUCOSEU NEGATIVE 04/25/2016 1613   HGBUR NEGATIVE 04/25/2016 1613   BILIRUBINUR SMALL (A) 04/25/2016 1613   KETONESUR NEGATIVE 04/25/2016 1613   PROTEINUR NEGATIVE 04/25/2016 1613   UROBILINOGEN 0.2 01/21/2012 1145   NITRITE NEGATIVE 04/25/2016 1613   LEUKOCYTESUR SMALL (A) 04/25/2016 1613   Recent  Results (from the past 240 hour(s))  MRSA PCR Screening     Status: None   Collection Time: 04/26/16 12:07 AM  Result Value Ref Range Status   MRSA by PCR NEGATIVE NEGATIVE Final      Radiology Studies: Dg Chest 2 View  Result Date: 04/25/2016 CLINICAL DATA:  Right anterior shoulder pain  after a fall 1 week ago. Initial encounter. EXAM: CHEST  2 VIEW COMPARISON:  04/05/2016. FINDINGS: Trachea is midline. Heart size normal. Lungs are clear. No pleural fluid. IMPRESSION: No acute findings. Electronically Signed   By: Lorin Picket M.D.   On: 04/25/2016 17:59   Dg Shoulder Right  Result Date: 04/25/2016 CLINICAL DATA:  Right anterior shoulder pain after a fall 1 week ago, initial encounter. EXAM: RIGHT SHOULDER - 2+ VIEW COMPARISON:  None. FINDINGS: No fracture or dislocation. Degenerative changes are seen in the right acromioclavicular joint. Visualized portion of the right chest is unremarkable. IMPRESSION: 1. No acute findings. 2. Right acromioclavicular joint osteoarthritis. Electronically Signed   By: Lorin Picket M.D.   On: 04/25/2016 18:00   Ct Head Wo Contrast  Result Date: 04/25/2016 CLINICAL DATA:  Pt from cancer center, lives at [facility]. RN from cancer center reports pt has failure to thrive , weakness and loe BP reading 85/73 . Pt receives radiation for brain tumor. Per RN pt appears very different from last time was seen. Alert and oriented x 4. RN also reports pt had fall recently, presents with right shoulder "knot ". EXAM: CT HEAD WITHOUT CONTRAST TECHNIQUE: Contiguous axial images were obtained from the base of the skull through the vertex without intravenous contrast. COMPARISON:  04/03/2016 FINDINGS: Brain: Of the previously identified supratentorial and infratentorial metastatic lesions in the brain, 9 of the lesions are visible today. Most of these are hyperdense the and some, including the dominant right basal ganglia lesion measuring 2.8 by 2.7 cm on image  11/2, and the right cerebellar vermis lesion measuring 0.9 by 0.7 cm on image 8/2, probably have hemorrhagic component. Many of these lesions did have low gradient signal on the recent MRI, favoring a hemorrhagic component. There is some mild dependent density in the occipital horn of the right lateral ventricle and questionably of the left, potentially from intraventricular hemorrhage as shown on prior MRI. The dominant lesion in the right basal ganglia causes 6 mm of right-to-left midline shift, similar to prior. I do not see any definite new lesions compared to the MRI, and not all the lesions visible on MRI are well seen on today's noncontrast CT. No new hemorrhage is identified. Vascular: There is atherosclerotic calcification of the cavernous carotid arteries bilaterally. Skull: Unremarkable Sinuses/Orbits: Unremarkable Other: No supplemental non-categorized findings. IMPRESSION: 1. Essentially stable distribution and appearance of hemorrhagic metastatic disease to the brain. The dominant lesion measures 2.8 cm and is in the right basal ganglia, causing 6 mm of focal right to left midline shift as it did previously. Trace amount of intraventricular hemorrhage. Nine of the previously identified metastatic lesions are visible today. I do not see any new lesions or new hemorrhage. Electronically Signed   By: Van Clines M.D.   On: 04/25/2016 20:16      Scheduled Meds: . sodium chloride   Intravenous Once  . ceFEPime (MAXIPIME) IV  1 g Intravenous Q8H  . dexamethasone  4 mg Intravenous Q12H  . insulin aspart  0-9 Units Subcutaneous TID WC  . magic mouthwash  5 mL Oral QID  . mouth rinse  15 mL Mouth Rinse BID  . mometasone-formoterol  2 puff Inhalation BID  . [START ON 04/29/2016] pantoprazole  40 mg Intravenous Q12H  . PRUTECT  1 application Topical Daily  . tacrolimus  1 application Topical BID  . vancomycin  1,000 mg Intravenous Q12H   Continuous Infusions: . sodium chloride 75 mL/hr at  04/26/16 1804  . pantoprozole (PROTONIX) infusion 8 mg/hr (04/26/16 1103)     LOS: 1 day    Time spent: 20 minutes    Faye Ramsay, MD [facility] Pager [phone]  If 7PM-7AM, please contact night-coverage [email or web address] Password [email or web address] 04/26/2016, 7:01 PM

## 2016-04-26 NOTE — Progress Notes (Addendum)
Inpatient Diabetes Program Recommendations  AACE/ADA: New Consensus Statement on Inpatient Glycemic Control (2015)  Target Ranges:  Prepandial:   less than 140 mg/dL      Peak postprandial:   less than 180 mg/dL (1-2 hours)      Critically ill patients:  140 - 180 mg/dL   Results for [name], [name] (MRN [id number]) as of 04/26/2016 08:51  Ref. Range 04/25/2016 17:00 04/25/2016 17:08 04/26/2016 07:10  Glucose Latest Ref Range: 65 - 99 mg/dL 211 (H) 209 (H) 210 (H)    Admit with: Weakness/ Hypotension/ GIB  History: DM, Lung Cancer with Mets to Brain  SNF DM Meds: Lantus 15 units QHS     Novolog 0-9 units tid per SSI     Glipizide 10 mg bid     Metformin 850 mg bid  Current Insulin Orders: Novolog Sensitive Correction Scale/ SSI (0-9 units) TID AC    -Note patient currently receiving Decadron 4 mg bid.  -Lab glucose elevated this AM (210 mg/dl).    MD-Please consider restarting a portion of pt's home dose of Lantus:   Lantus 8 units daily (50% home dose)     --Will follow patient during hospitalization--  Wyn Quaker RN, MSN, CDE Diabetes Coordinator Inpatient Glycemic Control Team Team Pager: [phone] (8a-5p)

## 2016-04-26 NOTE — Progress Notes (Deleted)
Spoke with Arlee Muslim, R-PAC via telephone. Verbal order received to discontinue Dexamethasone order.

## 2016-04-27 ENCOUNTER — Ambulatory Visit: Payer: PPO

## 2016-04-27 DIAGNOSIS — M25512 Pain in left shoulder: Secondary | ICD-10-CM

## 2016-04-27 DIAGNOSIS — C7951 Secondary malignant neoplasm of bone: Secondary | ICD-10-CM

## 2016-04-27 DIAGNOSIS — E1165 Type 2 diabetes mellitus with hyperglycemia: Secondary | ICD-10-CM

## 2016-04-27 DIAGNOSIS — M25511 Pain in right shoulder: Secondary | ICD-10-CM

## 2016-04-27 DIAGNOSIS — C3490 Malignant neoplasm of unspecified part of unspecified bronchus or lung: Secondary | ICD-10-CM

## 2016-04-27 DIAGNOSIS — R531 Weakness: Secondary | ICD-10-CM

## 2016-04-27 DIAGNOSIS — D649 Anemia, unspecified: Secondary | ICD-10-CM

## 2016-04-27 DIAGNOSIS — K5900 Constipation, unspecified: Secondary | ICD-10-CM

## 2016-04-27 DIAGNOSIS — D72829 Elevated white blood cell count, unspecified: Secondary | ICD-10-CM

## 2016-04-27 DIAGNOSIS — C7931 Secondary malignant neoplasm of brain: Secondary | ICD-10-CM

## 2016-04-27 LAB — CBC
HCT: 20.7 % — ABNORMAL LOW (ref 36.0–46.0)
HEMOGLOBIN: 6.7 g/dL — AB (ref 12.0–15.0)
MCH: 28.2 pg (ref 26.0–34.0)
MCHC: 32.4 g/dL (ref 30.0–36.0)
MCV: 87 fL (ref 78.0–100.0)
Platelets: 105 10*3/uL — ABNORMAL LOW (ref 150–400)
RBC: 2.38 MIL/uL — AB (ref 3.87–5.11)
RDW: 15.2 % (ref 11.5–15.5)
WBC: 14.2 10*3/uL — ABNORMAL HIGH (ref 4.0–10.5)

## 2016-04-27 LAB — BASIC METABOLIC PANEL
Anion gap: 9 (ref 5–15)
BUN: 16 mg/dL (ref 6–20)
CHLORIDE: 104 mmol/L (ref 101–111)
CO2: 20 mmol/L — ABNORMAL LOW (ref 22–32)
CREATININE: 0.61 mg/dL (ref 0.44–1.00)
Calcium: 7.9 mg/dL — ABNORMAL LOW (ref 8.9–10.3)
GFR calc non Af Amer: >60 mL/min (ref >60)
Glucose, Bld: 257 mg/dL — ABNORMAL HIGH (ref 65–99)
POTASSIUM: 3.8 mmol/L (ref 3.5–5.1)
SODIUM: 133 mmol/L — AB (ref 135–145)

## 2016-04-27 LAB — GLUCOSE, CAPILLARY
GLUCOSE-CAPILLARY: 206 mg/dL — AB (ref 65–99)
GLUCOSE-CAPILLARY: 379 mg/dL — AB (ref 65–99)
GLUCOSE-CAPILLARY: 238 mg/dL — AB (ref 65–99)
Glucose-Capillary: 221 mg/dL — ABNORMAL HIGH (ref 65–99)
Glucose-Capillary: 266 mg/dL — ABNORMAL HIGH (ref 65–99)
Glucose-Capillary: 283 mg/dL — ABNORMAL HIGH (ref 65–99)
Glucose-Capillary: 300 mg/dL — ABNORMAL HIGH (ref 65–99)

## 2016-04-27 LAB — PREPARE RBC (CROSSMATCH)

## 2016-04-27 LAB — URINE CULTURE: Culture: NO GROWTH

## 2016-04-27 MED ORDER — ALBUTEROL SULFATE (2.5 MG/3ML) 0.083% IN NEBU
2.5000 mg | INHALATION_SOLUTION | Freq: Every day | RESPIRATORY_TRACT | Status: DC
  Administered 2016-04-28: 2.5 mg via RESPIRATORY_TRACT
  Filled 2016-04-27 (×2): qty 3

## 2016-04-27 MED ORDER — POLYETHYLENE GLYCOL 3350 17 G PO PACK
17.0000 g | PACK | Freq: Every day | ORAL | Status: DC
  Administered 2016-04-27 – 2016-05-01 (×4): 17 g via ORAL
  Filled 2016-04-27 (×5): qty 1

## 2016-04-27 MED ORDER — SODIUM CHLORIDE 0.9 % IV SOLN
Freq: Once | INTRAVENOUS | Status: AC
  Administered 2016-04-27: 11:00:00 via INTRAVENOUS

## 2016-04-27 MED ORDER — SODIUM CHLORIDE 0.9 % IV SOLN
Freq: Once | INTRAVENOUS | Status: AC
  Administered 2016-04-27: 15:00:00 via INTRAVENOUS

## 2016-04-27 MED ORDER — FENTANYL 25 MCG/HR TD PT72
25.0000 ug | MEDICATED_PATCH | TRANSDERMAL | Status: DC
  Administered 2016-04-27 – 2016-04-30 (×2): 25 ug via TRANSDERMAL
  Filled 2016-04-27 (×2): qty 1

## 2016-04-27 NOTE — Progress Notes (Signed)
OT Cancellation Note  Patient Details Name: [name] MRN: [id number] DOB: 02/24/1951   Cancelled Treatment:    Reason Eval/Treat Not Completed: Fatigue/lethargy limiting ability to participate;Medical issues which prohibited therapy;Pain limiting ability to participate. Pt continues to politely decline OT today is getting blood transfusion and getting ready to eat lunch. Multiple family members present in her room this afternoon, Will check back tomorrow or next available day  Britt Bottom 04/27/2016, 12:48 PM

## 2016-04-27 NOTE — Progress Notes (Signed)
PT Cancellation Note  Patient Details Name: [name] MRN: [id number] DOB: 03-16-1951   Cancelled Treatment:    Reason Eval/Treat Not Completed: Pain limiting ability to participate (8/10 R shoulder pain, RN aware. Pt stated she's not feeling well enough to attempt getting OOB. Noted Hgb 6.7, pt to receive blood today, will attempt later today. ) Spoke to pt and her 2 daughters who stated pt was admitted from [facility] and pt plans to return there upon acute DC. At SNF she was ambulating with a RW with assistance and was requiring assistance for ADLs 2* R shoulder pain.    [name] 04/27/2016, 9:43 AM  [phone]

## 2016-04-27 NOTE — Progress Notes (Signed)
PT Cancellation Note  Patient Details Name: [name] MRN: [id number] DOB: August 18, 1951   Cancelled Treatment:    Reason Eval/Treat Not Completed: Pain limiting ability to participate (pt declined mobility 2* R shoulder pain, will attempt tomorrow. )   Philomena Doheny 04/27/2016, 12:50 PM [phone]

## 2016-04-27 NOTE — Progress Notes (Signed)
Pharmacy Antibiotic Note  [name] is a 65 y.o. female admitted on 04/25/2016 with sepsis.  Pharmacy has been consulted for cefepime/vancomycin dosing. Sepsis of unknown origin, ? SIRS criteria on admission secondary to steroid use (leukocytosis) and decreased effective circulating volume (hypotension/tachycardia).   Day #2 antibiotics - Renal: Scr stable - CBC: WBC mildly elevated (on dexamethasone) - No fevers  Plan:  Cefepime 1Gm IV q8h  vancomycin 1Gm IV q12h  VT=15-20 mg/L  Follow renal function and plan for trough Sunday am if continued  Height: '5\' 2"'$  (157.5 cm) Weight: 160 lb 0.9 oz (72.6 kg) IBW/kg (Calculated) : 50.1  Temp (24hrs), Avg:99 F (37.2 C), Min:98.8 F (37.1 C), Max:99.1 F (37.3 C)   Recent Labs Lab 04/25/16 1700 04/25/16 1707 04/25/16 1708 04/25/16 2315 04/26/16 0710 04/27/16 0502  WBC 20.1*  --   --  17.5* 15.2* 14.2*  CREATININE 0.75  --  0.70  --  0.57 0.61  LATICACIDVEN  --  3.85*  --  1.9  --   --     Estimated Creatinine Clearance: 67.2 mL/min (by C-G formula based on SCr of 0.8 mg/dL).    No Known Allergies  Antimicrobials this admission: 9/6 zosyn >> x1 ED 9/6 vancomycin >>  97 cefepime >>  Dose adjustmens this admission:   Microbiology results:  8/17 BCx: NG 9/6 UCx: IP 9/7 MRSA PCR: neg Thank you for allowing pharmacy to be a part of this patient's care.  Doreene Eland, PharmD, BCPS.   Pager: [phone] 04/27/2016 8:00 AM

## 2016-04-27 NOTE — Progress Notes (Addendum)
Patient ID: [name], female   DOB: 1950/08/25, 65 y.o.   MRN: [id number]    PROGRESS NOTE    [name]  OVF:[id number] DOB: [birth year] DOA: 04/25/2016  PCP: Robert Bellow, MD   Brief Narrative:  65 y.o. 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 As per patient and patient's daughter patient has been having poor appetite over the last few days. Has not been eating well. Has had a fall 2 weeks ago after which patient still has pain in the right shoulder and MRI 9/7 reveals full-thickness partial width anterior supraspinatus tear allowing fluid into the subacromial subdeltoid bursa, there is also evidence of metastatic disease in the soft tissue and bone of the shoulder. Labs reveal leukocytosis with elevated lactic acid levels.   Assessment & Plan:   1. Acute GI bleed, anemia of chronic disease, thrombocytopenia  - unclear etiology, pt had one U PRBC transfused on admission, Hg up from 6.7 --> 7.4 - transfuse another 2 units today due to falling hemoglobin, but no signs/symptoms of bleeding - FOBT +, MD yesterday discussed with GI on call PA Zehr and her attending Dr. Henrene Pastor and they  recommend conservative management given metastatic lung cancer, also PPI BID  - if hemoglobin falls again will formally consult GI, I did talk to PA again today 9/8  2. Sepsis of unclear etiology  - please note that pt met criteria for sepsis on admission with WBC 20 K, HR up to 107, immunocompromised pt and with SBP in 80's - note she has been on steroids and hypotension could be from anemia - BP stabilized after > 2 L NS and possible urinary source - blood cultures have unfortunately not been collected - for now continue broad spectrum abx vanc and maxipime day #3 - if pt starts spiking fever, would obtain blood cultures  -  follow up on urine cultures   3. Lung mass with brain metastasis  - 3.7 cm mass noted in medial LUL on CT with invasion into mediastinum (recent CT in August 2017) - Suspected to be a primary bronchogenic carcinoma  - Pt endorses FHx of lung cancer and has significant secondary smoke exposure from husband - per daughter patient was seen last hospital stay by Dr. Julien Nordmann, has appointment next week - will consult oncology today to guide management, and help with goals of care/treatment options - Multiple brain hemorrhages also noted on recent MRI in August 2017, largest of which is 2.8 x 2.5 x 2.6 cm at the genu of Rt internal capsule with mass effect on the third ventricle and midline shift of 7 mm  - pt follows with Dr. Lisbeth Renshaw for radiation therapy and has completed intracranial and thoracic radiation - pt has refused dexamethasone in the hospital at this time  4. Renal masses  - Bilateral renal masses noted on recent CT also in august 2017, possibly metastatic  - Abdomen MRI with & without contrast in August 2017 was notable for enhancing intramuscular lesions most consistent with metastasis, presumably from the patient's primary lung cancer.  5. Type II DM  - A1c was 7.2% in 2011  - Managed at home with insulin lantus and metformin - will keep on SSI until oral intake improved  - Check CBG q4h; change to with meals and qHS once appropriate for a diet  - Moderate-intensity  SSI correctional to start; will adjust prn   6. Right shoulder pain  - after recent fall - MRI of the shoulder shows Full-thickness partial width anterior supraspinatus tear allowing fluid into the subacromial subdeltoid bursa - doubt there is much to do other than supportive care, but have paged orthopedics to discuss   7. GERD - GI recommended PPI BID   DVT prophylaxis: SCD's  Code Status: Full  Family Communication: Family updated at bedside (daughter) Disposition Plan: to be determined   Consultants:     GI (over the phone)  Oncology  Orthopedics  Procedures:   None  Antimicrobials:   Vanco/Cefepime   Subjective: No events overnight, pt reports being still tired but better since admission. Daughter at bedside this AM, helping her eat since she can't use right arm. Denies blood in urine or stool, no vomiting.  Objective: Vitals:   04/26/16 1013 04/26/16 1457 04/26/16 1957 04/27/16 0533  BP:  123/62 119/60 118/63  Pulse: 78 96 96 79  Resp: 18 18 18 18   Temp:   99.1 F (37.3 C) 98.8 F (37.1 C)  TempSrc:   Oral Oral  SpO2: 96% 93% 97% 98%  Weight:      Height:        Intake/Output Summary (Last 24 hours) at 04/27/16 0841 Last data filed at 04/27/16 0600  Gross per 24 hour  Intake             1825 ml  Output              150 ml  Net             1675 ml   Filed Weights   04/25/16 2316  Weight: 72.6 kg (160 lb 0.9 oz)    Examination:  General exam: Appears calm and comfortable  Respiratory system: Respiratory effort normal. Cardiovascular system: S1 & S2 heard, RRR. No JVD, murmurs, rubs, gallops or clicks. No pedal edema. Gastrointestinal system: Abdomen is nondistended, soft and nontender. No organomegaly or masses felt.  Central nervous system: Alert and oriented. No focal neurological deficits. MSK: Pain with passive movement of right shoulder  Data Reviewed: I have personally reviewed following labs and imaging studies  CBC:  Recent Labs Lab 04/25/16 1700 04/25/16 1708 04/25/16 2315 04/26/16 0710 04/27/16 0502  WBC 20.1*  --  17.5* 15.2* 14.2*  NEUTROABS 18.2*  --   --   --   --   HGB 7.8* 8.5* 6.7* 7.4* 6.7*  HCT 24.4* 25.0* 20.9* 22.7* 20.7*  MCV 84.1  --  87.1 85.0 87.0  PLT 161  --  141* 120* 194*   Basic Metabolic Panel:  Recent Labs Lab 04/25/16 1700 04/25/16 1708 04/26/16 0710 04/27/16 0502  NA 134* 134* 137 133*  K 3.9 3.9 3.8 3.8  CL 104 100* 109 104  CO2 21*  --  21* 20*  GLUCOSE 211* 209* 210* 257*  BUN 28* 24* 21* 16   CREATININE 0.75 0.70 0.57 0.61  CALCIUM 8.9  --  8.0* 7.9*  MG 1.8  --   --   --    Liver Function Tests:  Recent Labs Lab 04/25/16 1700  AST 14*  ALT 16  ALKPHOS 77  BILITOT 1.5*  PROT 6.1*  ALBUMIN 2.7*    Recent Labs Lab 04/25/16 1700  LIPASE 19   CBG:  Recent Labs Lab 04/25/16 2347 04/26/16 0422 04/26/16 0738 04/26/16 1256  GLUCAP 212* 210* 216* 176*   Urine analysis:  Component Value Date/Time   COLORURINE AMBER (A) 04/25/2016 1613   APPEARANCEUR CLOUDY (A) 04/25/2016 1613   LABSPEC 1.027 04/25/2016 1613   PHURINE 5.5 04/25/2016 1613   GLUCOSEU NEGATIVE 04/25/2016 1613   HGBUR NEGATIVE 04/25/2016 1613   BILIRUBINUR SMALL (A) 04/25/2016 1613   KETONESUR NEGATIVE 04/25/2016 1613   PROTEINUR NEGATIVE 04/25/2016 1613   UROBILINOGEN 0.2 01/21/2012 1145   NITRITE NEGATIVE 04/25/2016 1613   LEUKOCYTESUR SMALL (A) 04/25/2016 1613   Recent Results (from the past 240 hour(s))  MRSA PCR Screening     Status: None   Collection Time: 04/26/16 12:07 AM  Result Value Ref Range Status   MRSA by PCR NEGATIVE NEGATIVE Final      Radiology Studies: Dg Chest 2 View  Result Date: 04/25/2016 CLINICAL DATA:  Right anterior shoulder pain after a fall 1 week ago. Initial encounter. EXAM: CHEST  2 VIEW COMPARISON:  04/05/2016. FINDINGS: Trachea is midline. Heart size normal. Lungs are clear. No pleural fluid. IMPRESSION: No acute findings. Electronically Signed   By: Lorin Picket M.D.   On: 04/25/2016 17:59   Dg Shoulder Right  Result Date: 04/25/2016 CLINICAL DATA:  Right anterior shoulder pain after a fall 1 week ago, initial encounter. EXAM: RIGHT SHOULDER - 2+ VIEW COMPARISON:  None. FINDINGS: No fracture or dislocation. Degenerative changes are seen in the right acromioclavicular joint. Visualized portion of the right chest is unremarkable. IMPRESSION: 1. No acute findings. 2. Right acromioclavicular joint osteoarthritis. Electronically Signed   By: Lorin Picket  M.D.   On: 04/25/2016 18:00   Ct Head Wo Contrast  Result Date: 04/25/2016 CLINICAL DATA:  Pt from cancer center, lives at [facility]. RN from cancer center reports pt has failure to thrive , weakness and loe BP reading 85/73 . Pt receives radiation for brain tumor. Per RN pt appears very different from last time was seen. Alert and oriented x 4. RN also reports pt had fall recently, presents with right shoulder "knot ". EXAM: CT HEAD WITHOUT CONTRAST TECHNIQUE: Contiguous axial images were obtained from the base of the skull through the vertex without intravenous contrast. COMPARISON:  04/03/2016 FINDINGS: Brain: Of the previously identified supratentorial and infratentorial metastatic lesions in the brain, 9 of the lesions are visible today. Most of these are hyperdense the and some, including the dominant right basal ganglia lesion measuring 2.8 by 2.7 cm on image 11/2, and the right cerebellar vermis lesion measuring 0.9 by 0.7 cm on image 8/2, probably have hemorrhagic component. Many of these lesions did have low gradient signal on the recent MRI, favoring a hemorrhagic component. There is some mild dependent density in the occipital horn of the right lateral ventricle and questionably of the left, potentially from intraventricular hemorrhage as shown on prior MRI. The dominant lesion in the right basal ganglia causes 6 mm of right-to-left midline shift, similar to prior. I do not see any definite new lesions compared to the MRI, and not all the lesions visible on MRI are well seen on today's noncontrast CT. No new hemorrhage is identified. Vascular: There is atherosclerotic calcification of the cavernous carotid arteries bilaterally. Skull: Unremarkable Sinuses/Orbits: Unremarkable Other: No supplemental non-categorized findings. IMPRESSION: 1. Essentially stable distribution and appearance of hemorrhagic metastatic disease to the brain. The dominant lesion measures 2.8 cm and is in the right  basal ganglia, causing 6 mm of focal right to left midline shift as it did previously. Trace amount of intraventricular hemorrhage. Nine of the previously identified metastatic  lesions are visible today. I do not see any new lesions or new hemorrhage. Electronically Signed   By: Van Clines M.D.   On: 04/25/2016 20:16   Mr Shoulder Right Wo Contrast  Result Date: 04/26/2016 CLINICAL DATA:  Metastatic lung cancer.  Right shoulder pain. EXAM: MRI OF THE RIGHT SHOULDER WITHOUT CONTRAST TECHNIQUE: Multiplanar, multisequence MR imaging of the shoulder was performed. No intravenous contrast was administered. COMPARISON:  None. FINDINGS: Rotator cuff: Small full-thickness partial width anterior supraspinatus tear, image 13/ 4. Moderate supraspinatus tendinopathy with some partial thickness articular surface tearing and mild tendon delamination as on images 11 through 12 series 4. This causes thinning of the supraspinatus tendon. There is mild infraspinatus tendinopathy. Muscles: 3 masses are present in the lateral deltoid muscle, the largest 3.8 by 1.9 by 2.0 cm, with surrounding muscular edema compatible with muscular metastatic lesions. 1.9 by 2.2 by 4.7 cm mass in the teres minor muscle. 0.8 by 0.8 by 2.2 cm mass in the infraspinatus muscle. Edema tracks within along the distal trapezius muscle as it extends towards the distal clavicle. Biceps long head:  Unremarkable Acromioclavicular Joint: Abnormal edema in the distal clavicle suspicious for a metastatic lesion, with some cortical ill definition and surrounding soft tissue edema. There is a second bony lesion in the acromion posteriorly suspicious for metastatic lesion. Abnormal fluid in the subacromial subdeltoid bursa. Type II acromion. Glenohumeral Joint: Moderate degenerative chondral thinning. No joint effusion. Labrum:  Grossly unremarkable Bones: 1.4 by 1.3 cm speckled lesion with high T2 and low T1 signal characteristics in the humeral head adjacent  to the greater tuberosity, probably degenerative, less likely metastatic. A chondroid lesion could also have this appearance. Other: No supplemental non-categorized findings. IMPRESSION: 1. Full-thickness partial width anterior supraspinatus tear allowing fluid into the subacromial subdeltoid bursa. There is moderate supraspinatus tendinopathy also with some partial thickness articular surface tearing in the supraspinatus. Mild infraspinatus tendinopathy. 2. Scattered metastatic lesions are present in the muscular and skeletal structures of the right shoulder, including several lesions in the deltoid, a mass in the teres minor muscle, and a smaller mass in the infraspinatus muscle, all with surrounding muscular edema. Bony lesions occluded distal clavicular lesion and a proximal acromial lesion. 3. Nonspecific 1.4 by 1.3 cm lesion in the humeral head may be degenerative or less likely metastatic. Electronically Signed   By: Van Clines M.D.   On: 04/26/2016 22:25      Scheduled Meds: . sodium chloride   Intravenous Once  . sodium chloride   Intravenous Once  . sodium chloride   Intravenous Once  . ceFEPime (MAXIPIME) IV  1 g Intravenous Q8H  . dexamethasone  4 mg Intravenous Q12H  . insulin aspart  0-9 Units Subcutaneous TID WC  . insulin glargine  8 Units Subcutaneous Q2200  . magic mouthwash  5 mL Oral QID  . mouth rinse  15 mL Mouth Rinse BID  . mometasone-formoterol  2 puff Inhalation BID  . [START ON 04/29/2016] pantoprazole  40 mg Intravenous Q12H  . PRUTECT  1 application Topical Daily  . tacrolimus  1 application Topical BID  . vancomycin  1,000 mg Intravenous Q12H   Continuous Infusions:     LOS: 2 days    Time spent: 28 minutes    Felipe Paluch Marry Guan, MD [facility] Pager [phone]  If 7PM-7AM, please contact night-coverage [email or web address] Password [facility] 04/27/2016, 8:41 AM

## 2016-04-27 NOTE — Progress Notes (Signed)
Patient ID: [name], female   DOB: [birth year], 65 y.o.   MRN: [id number] I spoke with Ms. Deschepper and her family at the bedside.  She does exhibit pain and weakness with her right shoulder.  Given her medical condition, obviously surgery is not warranted.  I will come by sometime Saturday and at least place a steroid injection in her right shoulder that could help from a pain standpoint.

## 2016-04-27 NOTE — Progress Notes (Signed)
CRITICAL VALUE ALERT  Critical value received:  Hemoglobin 6.7  Date of notification:  04/27/2016  Time of notification:  0617  Critical value read back:Yes.    Nurse who received alert:  Daleen Bo RN  MD notified (1st page):  Tylene Fantasia NP  Time of first page:  0627  MD notified (2nd page):  Time of second page:  Responding MD:  Tylene Fantasia NP  Time MD responded:  [phone]

## 2016-04-27 NOTE — Progress Notes (Signed)
Results for [name], [name] (MRN [id number]) as of 04/27/2016 13:18  Ref. Range 04/26/2016 16:59 04/26/2016 19:55 04/27/2016 00:04 04/27/2016 05:14 04/27/2016 07:44  Glucose-Capillary Latest Ref Range: 65 - 99 mg/dL 206 (H) 379 (H) 221 (H) 266 (H) 238 (H)   CBGs greater than 180 mg/dl. Recommend increasing Lantus to 10-12 units daily if CBGs continue to be elevated. Will continue to monitor blood sugars while in the hospital. Harvel Ricks RN BSN CDE

## 2016-04-27 NOTE — Consult Note (Signed)
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 female was recently diagnosed with stage IV metastatic lung cancer. The patient has started and completed radiation therapy recently. She was in the skilled nursing facility but was brought in 2 days ago due to weakness and hypotension. She denies chest pain or shortness of breath. The patient has very poor appetite and complained of throat pain and mild dysphagia. The patient denies any recent signs or symptoms of bleeding such as spontaneous epistaxis, hematuria or hematochezia.  However, fecal local blood testing was positive. There were no reported history of NSAID use but the patient has been on high-dose dexamethasone due to recent brain metastasis She had recent cough which is nonproductive. She has complained of severe right shoulder pain. MRI confirmed metastasis. She had recent constipation. Her mobility is very poor due to profound weakness. She can barely walk with assistance from the bed to the bathroom. She denies recent fecal or urinary incontinence She denies recent seizures, headache or worsening neurological deficits  MEDICAL HISTORY:  Past Medical History:  Diagnosis Date  . Anxiety   . Asthma   . Bronchitis   . Depression   . Essential hypertension   . GERD (gastroesophageal reflux disease)   . Hyperlipidemia   . Lichen sclerosus   . Osteoarthritis   . SVT (supraventricular tachycardia) (HCC)    No inducible arrhythmias by EP study 11/11  . Type 2 diabetes mellitus ([place])     SURGICAL HISTORY: Past Surgical History:  Procedure Laterality Date  . CESAREAN SECTION WITH BILATERAL TUBAL LIGATION  1978  . CHOLECYSTECTOMY  2007  . EP study  11/11    SOCIAL HISTORY: Social History    Social History  . Marital status: Married    Spouse name: N/A  . Number of children: N/A  . Years of education: N/A   Occupational History  . Not on file.   Social History Main Topics  . Smoking status: Former Smoker    Types: Cigarettes  . Smokeless tobacco: Never Used     Comment: quit in 1980's  . Alcohol use No  . Drug use: No     Comment: Prior history of drug use  . Sexual activity: Yes    Birth control/ protection: Post-menopausal   Other Topics Concern  . Not on file   Social History Narrative   Married, 3 children. Lives in [place], unemployed.     FAMILY HISTORY: Family History  Problem Relation Age of Onset  . Hypertension Father   . COPD Father   . Parkinson's disease Father   . Hypertension Mother     ALLERGIES:  has No Known Allergies.  MEDICATIONS:  Current Facility-Administered Medications  Medication Dose Route Frequency Provider Last Rate Last Dose  . 0.9 %  sodium chloride infusion   Intravenous Once Gardiner Barefoot, NP      . acetaminophen (TYLENOL) tablet 650 mg  650 mg Oral Q6H PRN [name], MD       Or  . acetaminophen (TYLENOL) suppository 650 mg  650 mg Rectal Q6H PRN [name], MD      . albuterol (PROVENTIL) (2.5 MG/3ML) 0.083% nebulizer solution 3 mL  3 mL Inhalation Q6H PRN [name], MD      .  ceFEPIme (MAXIPIME) 1 g in dextrose 5 % 50 mL IVPB  1 g Intravenous Q8H Dorrene German, RPH   1 g at 04/27/16 1509  . dexamethasone (DECADRON) injection 4 mg  4 mg Intravenous Q12H [name], MD   4 mg at 04/27/16 1021  . fentaNYL (DURAGESIC - dosed mcg/hr) patch 25 mcg  25 mcg Transdermal Q72H Bookert Guzzi, MD      . insulin aspart (novoLOG) injection 0-9 Units  0-9 Units Subcutaneous TID WC [name], MD   5 Units at 04/27/16 1740  . insulin glargine (LANTUS) injection 8 Units  8 Units Subcutaneous Q2200 Theodis Blaze, MD   8 Units at 04/26/16 2320  . LORazepam (ATIVAN) injection 0.5 mg   0.5 mg Intravenous Q8H PRN Theodis Blaze, MD      . magic mouthwash  5 mL Oral QID Gardiner Barefoot, NP   5 mL at 04/27/16 1740  . MEDLINE mouth rinse  15 mL Mouth Rinse BID [name], MD   15 mL at 04/27/16 1016  . mometasone-formoterol (DULERA) 200-5 MCG/ACT inhaler 2 puff  2 puff Inhalation BID [name], MD   2 puff at 04/27/16 0845  . morphine 2 MG/ML injection 1 mg  1 mg Intravenous Q4H PRN Gardiner Barefoot, NP   1 mg at 04/26/16 0850  . morphine 2 MG/ML injection 2 mg  2 mg Intravenous Q3H PRN Theodis Blaze, MD   2 mg at 04/27/16 1836  . ondansetron (ZOFRAN) tablet 4 mg  4 mg Oral Q6H PRN [name], MD       Or  . ondansetron [facility]) injection 4 mg  4 mg Intravenous Q6H PRN [name], MD      . Derrill Memo ON 04/29/2016] pantoprazole (PROTONIX) injection 40 mg  40 mg Intravenous Q12H [name], MD      . polyethylene glycol (MIRALAX / GLYCOLAX) packet 17 g  17 g Oral Daily [name], MD      . PRUTECT emulsion 1 application  1 application Topical Daily [name], MD   1 application at 57/32/20 1400  . tacrolimus (PROTOPIC) 0.1 % ointment 1 application  1 application Topical BID [name], MD      . vancomycin (VANCOCIN) IVPB 1000 mg/200 mL premix  1,000 mg Intravenous Q12H Dorrene German, RPH   1,000 mg at 04/27/16 2542    REVIEW OF SYSTEMS:   Constitutional: Denies fevers, chills or abnormal night sweats Eyes: Denies blurriness of vision, double vision or watery eyes Ears, nose, mouth, throat, and face: Denies mucositis or sore throat Respiratory: Denies cough, dyspnea or wheezes Cardiovascular: Denies palpitation, chest discomfort or lower extremity swelling Gastrointestinal:  Denies nausea, heartburn or change in bowel habits Skin: Denies abnormal skin rashes Lymphatics: Denies new lymphadenopathy or easy bruising Behavioral/Psych: Mood is stable, no new changes  All other systems were reviewed with the  patient and are negative.  PHYSICAL EXAMINATION: ECOG PERFORMANCE STATUS: 3 - Symptomatic, >50% confined to bed  Vitals:   04/27/16 1536 04/27/16 1559  BP: 130/73 131/78  Pulse: 77 72  Resp: 16 14  Temp: 99.3 F (37.4 C) 99.4 F (37.4 C)   Filed Weights   04/25/16 2316  Weight: 160 lb 0.9 oz (72.6 kg)    GENERAL:alert, no distress and comfortable. She is moderately obese SKIN: skin color is pale, texture, turgor are normal, no rashes or significant lesions EYES: normal,  conjunctiva are pale and non-injected, sclera clear OROPHARYNX: She has mild thrush  NECK: supple, thyroid normal size, non-tender, without nodularity LYMPH:  no palpable lymphadenopathy in the cervical, axillary or inguinal LUNGS: clear to auscultation and percussion with normal breathing effort HEART: regular rate & rhythm and no murmurs and no lower extremity edema ABDOMEN:abdomen soft, non-tender and normal bowel sounds Musculoskeletal:no cyanosis of digits and no clubbing  PSYCH: alert & oriented x 3 with fluent speech NEURO: no focal motor/sensory deficits. Strength is difficult to assess, limited due to right shoulder pain  LABORATORY DATA:  I have reviewed the data as listed Lab Results  Component Value Date   WBC 14.2 (H) 04/27/2016   HGB 6.7 (LL) 04/27/2016   HCT 20.7 (L) 04/27/2016   MCV 87.0 04/27/2016   PLT 105 (L) 04/27/2016    Recent Labs  04/01/16 1210  04/06/16 0547 04/25/16 1700 04/25/16 1708 04/26/16 0710 04/27/16 0502  NA 134*  < > 133* 134* 134* 137 133*  K 4.1  < > 4.1 3.9 3.9 3.8 3.8  CL 100*  < > 97* 104 100* 109 104  CO2 24  < > 26 21*  --  21* 20*  GLUCOSE 327*  < > 287* 211* 209* 210* 257*  BUN 9  < > 15 28* 24* 21* 16  CREATININE 0.64  < > 0.76 0.75 0.70 0.57 0.61  CALCIUM 8.7*  < > 9.3 8.9  --  8.0* 7.9*  GFRNONAA >60  < > >60 >60  --  >60 >60  GFRAA >60  < > >60 >60  --  >60 >60  PROT 7.1  --  6.7 6.1*  --   --   --   ALBUMIN 3.8  --  3.4* 2.7*  --   --   --    AST 22  --  12* 14*  --   --   --   ALT 16  --  15 16  --   --   --   ALKPHOS 60  --  63 77  --   --   --   BILITOT 1.2  --  1.0 1.5*  --   --   --   < > = values in this interval not displayed.  RADIOGRAPHIC STUDIES: I have personally reviewed the radiological images as listed and agreed with the findings in the report. Dg Chest 2 View  Result Date: 04/25/2016 CLINICAL DATA:  Right anterior shoulder pain after a fall 1 week ago. Initial encounter. EXAM: CHEST  2 VIEW COMPARISON:  04/05/2016. FINDINGS: Trachea is midline. Heart size normal. Lungs are clear. No pleural fluid. IMPRESSION: No acute findings. Electronically Signed   By: Lorin Picket M.D.   On: 04/25/2016 17:59   Dg Chest 2 View  Result Date: 04/01/2016 CLINICAL DATA:  Chest pain and dizziness. Several falls in last 24 hours. EXAM: CHEST  2 VIEW COMPARISON:  08/26/2015 FINDINGS: The heart size and mediastinal contours are within normal limits. Both lungs are clear. The visualized skeletal structures are unremarkable. IMPRESSION: No active cardiopulmonary disease. Electronically Signed   By: Earle Gell M.D.   On: 04/01/2016 13:38   Dg Shoulder Right  Result Date: 04/25/2016 CLINICAL DATA:  Right anterior shoulder pain after a fall 1 week ago, initial encounter. EXAM: RIGHT SHOULDER - 2+ VIEW COMPARISON:  None. FINDINGS: No fracture or dislocation. Degenerative changes are seen in the right acromioclavicular joint. Visualized portion of the right chest is unremarkable. IMPRESSION:  1. No acute findings. 2. Right acromioclavicular joint osteoarthritis. Electronically Signed   By: Lorin Picket M.D.   On: 04/25/2016 18:00   Ct Head Wo Contrast  Result Date: 04/25/2016 CLINICAL DATA:  Pt from cancer center, lives at [facility]. RN from cancer center reports pt has failure to thrive , weakness and loe BP reading 85/73 . Pt receives radiation for brain tumor. Per RN pt appears very different from last time was seen. Alert  and oriented x 4. RN also reports pt had fall recently, presents with right shoulder "knot ". EXAM: CT HEAD WITHOUT CONTRAST TECHNIQUE: Contiguous axial images were obtained from the base of the skull through the vertex without intravenous contrast. COMPARISON:  04/03/2016 FINDINGS: Brain: Of the previously identified supratentorial and infratentorial metastatic lesions in the brain, 9 of the lesions are visible today. Most of these are hyperdense the and some, including the dominant right basal ganglia lesion measuring 2.8 by 2.7 cm on image 11/2, and the right cerebellar vermis lesion measuring 0.9 by 0.7 cm on image 8/2, probably have hemorrhagic component. Many of these lesions did have low gradient signal on the recent MRI, favoring a hemorrhagic component. There is some mild dependent density in the occipital horn of the right lateral ventricle and questionably of the left, potentially from intraventricular hemorrhage as shown on prior MRI. The dominant lesion in the right basal ganglia causes 6 mm of right-to-left midline shift, similar to prior. I do not see any definite new lesions compared to the MRI, and not all the lesions visible on MRI are well seen on today's noncontrast CT. No new hemorrhage is identified. Vascular: There is atherosclerotic calcification of the cavernous carotid arteries bilaterally. Skull: Unremarkable Sinuses/Orbits: Unremarkable Other: No supplemental non-categorized findings. IMPRESSION: 1. Essentially stable distribution and appearance of hemorrhagic metastatic disease to the brain. The dominant lesion measures 2.8 cm and is in the right basal ganglia, causing 6 mm of focal right to left midline shift as it did previously. Trace amount of intraventricular hemorrhage. Nine of the previously identified metastatic lesions are visible today. I do not see any new lesions or new hemorrhage. Electronically Signed   By: Van Clines M.D.   On: 04/25/2016 20:16   Ct Head Wo  Contrast  Addendum Date: 04/02/2016   ADDENDUM REPORT: 04/02/2016 07:37 ADDENDUM: Study discussed by telephone with Dr. Kristeen Miss on 04/02/2016 at 0720 hours. We discussed that the multiple (at least 6) hyperdense lesions scattered in the brain are very round and discretely marginated. These are most compatible with brain masses. Their hyperdensity could beyond the basis of hypercellularity, or possibly some internal hemorrhage. However, there is no bona fide acute intracranial hemorrhage. There is edema surrounding the largest lesion measuring 28 mm which is centered at the right deep gray matter, and mild associated regional mass effect. CONCLUSION 1. Multiple hyperdense brain metastases. See also CT Chest Abdomen and Pelvis from the same day. 2. No acute intracranial hemorrhage. Electronically Signed   By: Genevie Ann M.D.   On: 04/02/2016 07:37   Result Date: 04/02/2016 CLINICAL DATA:  Reported pt with multiple falls in last 24 hours and started calling people in middle of night. "feels like screaming" and wants people to do want she wants. Hx dm. EXAM: CT HEAD WITHOUT CONTRAST TECHNIQUE: Contiguous axial images were obtained from the base of the skull through the vertex without intravenous contrast. COMPARISON:  None. FINDINGS: There are multiple brain hemorrhages. In the right base a ganglia  centered in the region of the genu of the right internal capsule, there is a large round hemorrhage measuring 2.8 x 2.5 x 2.6 cm. This has mass effect mostly facing the third ventricle and shifting the midline structures to the left by 7 mm. There is surrounding vasogenic edema. There are multiple small hemorrhages along the corticomedullary junctions. There is a hemorrhage in the anterior left temporal lobe another smaller hemorrhage in the posterior left occipital lobe another in the right frontal lobe, 1 in the right cerebellum and a smaller along the peripheral left cerebellum. Another area of hemorrhage lies along  the inferior margin of the temporal horn of the right lateral ventricle in the right temporal lobe. There is mild prominence of the lateral ventricles, most notably the atria and temporal horns. This suggests a mild degree of obstructive hydrocephalus. There is mild patchy white matter hypoattenuation consistent with chronic microvascular ischemic change. There is no evidence of an ischemic infarct. There are no extra-axial masses or abnormal fluid collections. No skull lesion. The visualized sinuses and mastoid air cells are clear. IMPRESSION: 1. Multiple brain hemorrhages. Given the pattern, hemorrhagic metastatic disease is suspected. Alternatively, the large right base a ganglia hemorrhage may be a hypertensive hemorrhage. Other corticomedullary junction hemorrhages could potentially be traumatic in origin. 2. Recommend nonemergent follow-up chest, abdomen and pelvis CT to assess for neoplastic disease. Electronically Signed: By: Lajean Manes M.D. On: 04/01/2016 14:08   Ct Chest W Contrast  Result Date: 04/01/2016 CLINICAL DATA:  Multiple falls. Hemorrhagic brain metastases seen on recent CT. Diabetes. EXAM: CT CHEST, ABDOMEN, AND PELVIS WITH CONTRAST TECHNIQUE: Multidetector CT imaging of the chest, abdomen and pelvis was performed following the standard protocol during bolus administration of intravenous contrast. CONTRAST:  126m ISOVUE-300 IOPAMIDOL (ISOVUE-300) INJECTION 61% COMPARISON:  AP CT on 07/25/2005 FINDINGS: CT CHEST FINDINGS Cardiovascular: Normal heart size. Mild LAD coronary artery calcification seen. Aortic atherosclerosis noted. Mediastinum/Lymph Nodes: No pathologically enlarged lymph nodes identified. Lungs/Pleura: An irregular pulmonary masses seen in the medial left upper lobe which shows invasion of adjacent mediastinal fat. This measures 3.7 x 2.9 cm on image 13/2. This is suspicious for primary bronchogenic carcinoma. 5 mm perifissural nodule in the right lung along the minor  fissure on image 72/series 7 is most consistent with a benign intrapulmonary lymph node. No evidence of pleural effusion. Musculoskeletal: No chest wall mass or suspicious bone lesions identified. CT ABDOMEN PELVIS FINDINGS Hepatobiliary: Moderate hepatic steatosis is seen but decreased since previous study. No liver masses identified. Prior cholecystectomy noted. No evidence of biliary dilatation. Pancreas: No mass, inflammatory changes, or other significant abnormality. Spleen: Within normal limits in size and appearance. Adrenals/Urinary Tract: Normal adrenal glands. 2.5 cm low-attenuation lesion is seen in the upper pole left kidney on image 20/8, and low-attenuation lesion measuring 2.2 cm is seen in the upper pole of the right kidney on image 23/8. These are new since previous study, are ill-defined, and do not meet criteria for benign cysts. Differential diagnosis includes renal metastases, renal cell carcinomas, and renal abscesses. No evidence of hydronephrosis. Unopacified urinary bladder is unremarkable in appearance. Stomach/Bowel: No evidence of obstruction, inflammatory process, or abnormal fluid collections. Vascular/Lymphatic: No pathologically enlarged lymph nodes. No evidence of abdominal aortic aneurysm. Aortic atherosclerosis noted. Reproductive: No mass or other significant abnormality. Other: None. Musculoskeletal:  No suspicious bone lesions identified. IMPRESSION: 3.7 cm mass in medial left upper lobe with adjacent mediastinal invasion. This is highly suspicious for primary bronchogenic carcinoma. No evidence  of thoracic lymphadenopathy or pleural effusion. New ill-defined low-attenuation masses in both kidneys measuring 2-3 cm. Differential diagnosis includes renal metastases, primary renal cell carcinomas, and renal abscesses. Consider abdomen MRI without and with contrast for further characterization. No other sites of metastatic disease identified within the abdomen or pelvis. Decreased  hepatic steatosis since prior study. Aortic atherosclerosis and coronary artery calcification. Electronically Signed   By: Earle Gell M.D.   On: 04/01/2016 17:19   Mr Jeri Cos IE Contrast  Result Date: 04/04/2016 CLINICAL DATA:  65 y/o F; lung cancer with brain metastasis, etc starting patient. Increasing confusion. EXAM: MRI HEAD WITHOUT AND WITH CONTRAST TECHNIQUE: Multiplanar, multiecho pulse sequences of the brain and surrounding structures were obtained without and with intravenous contrast. CONTRAST:  67m MULTIHANCE GADOBENATE DIMEGLUMINE 529 MG/ML IV SOLN COMPARISON:  CT of the head dated 04/01/2016. FINDINGS: Brain: There are multiple foci (12-15) of susceptibility hypointensity compatible with hemorrhage throughout the supratentorial and infratentorial brain, the left thalamus, and within the right basal ganglia. The hyperdense lesions on the prior CT of the head have corresponding susceptibility foci and are consistent with acute hemorrhage. There are additional foci of susceptibility not appreciable on CT which may represent pole hemorrhage for example in the left thalamus and right lateral cerebellar hemisphere. The postcontrast images are severely motion degraded with a suboptimal assessment for enhancement. The larger lesions demonstrate T2 FLAIR hyperintensity likely representing vasogenic edema. The largest lesion in the right basal ganglia measures approximately 30 x 32 mm axially (series 8, image 14) and exerts mass effect with partial effacement of the third ventricle and right lateral ventricle and a 6 mm of right-to-left midline shift of septum pellucidum. Extra-axial space: Small diameter hemorrhage pooling within the occipital horns of the lateral ventricles. No hydrocephalus. No additional extra-axial collection is identified. Other: No abnormal signal of the paranasal sinuses. No abnormal signal of the mastoid air cells. Orbits are unremarkable. Calvarium is unremarkable. IMPRESSION: 1.  Multiple foci (12-15) of susceptibility hypointensity throughout the supra and infratentorial brain as well as the basal ganglia consistent with hemorrhage. Postcontrast images are severely motion degraded with a suboptimal assessment for enhancement. Some foci of susceptibility do not have a CT correlate and may represent older hemorrhage. Findings probably represent hemorrhagic metastatic disease. Some foci may be related to hypertension. Consider repeat postcontrast images with sedation when patient is able. 2. Vasogenic edema and mass effect associated with the largest right basal ganglia lesion partially effaces the third and right lateral ventricle and results in stable 6 mm of right to left midline shift. Stable small volume of intraventricular hemorrhage. Electronically Signed   By: LKristine GarbeM.D.   On: 04/04/2016 01:13   Mr Abdomen W Wo Contrast  Result Date: 04/02/2016 CLINICAL DATA:  Renal lesions on CT. Hemorrhagic brain metastasis. Multiple falls. EXAM: MRI ABDOMEN WITHOUT AND WITH CONTRAST TECHNIQUE: Multiplanar multisequence MR imaging of the abdomen was performed both before and after the administration of intravenous contrast. CONTRAST:  147mMULTIHANCE GADOBENATE DIMEGLUMINE 529 MG/ML IV SOLN COMPARISON:  Abdominal CT of 04/01/2016 FINDINGS: Mild to moderate motion degradation throughout. Lower chest: Normal heart size without pericardial or pleural effusion. Hepatobiliary: Moderate hepatic steatosis. No focal liver lesion. Cholecystectomy, without biliary ductal dilatation. Pancreas: Normal, without mass or ductal dilatation. Spleen: Normal in size, without focal abnormality. Adrenals/Urinary Tract: Normal adrenal glands. Too small to characterize lesions in both kidneys. 1.3 cm upper pole right renal lesion is T2 hypo intense and demonstrates mild post-contrast enhancement,  including on image 56/ series 1003. A 2.0 cm upper pole left renal lesion is also T2 hypointense and  demonstrates mild post-contrast enhancement, including on image 46/ series 1005. No hydronephrosis. Stomach/Bowel: Normal stomach and abdominal bowel loops. Vascular/Lymphatic: Normal caliber of the aorta and branch vessels. Patent renal veins. No retroperitoneal or retrocrural adenopathy. Other: No ascites. Musculoskeletal: Right paraspinous muscular enhancing lesion measures 1.3 cm on image 15/series 4. A left high psoas enhancing nodule measures 1.8 cm on image 51/ series 1002. IMPRESSION: 1. Enhancing intramuscular lesions are most consistent with metastasis, presumably from the patient's primary lung cancer. 2. Bilateral hypoenhancing renal lesions with precontrast T2 hypointensity. Favor metastasis. Differential considerations include multiple papillary type renal cell carcinomas versus less likely lipid poor angiomyolipomas. 3. Motion degraded exam. 4. Hepatic steatosis. Electronically Signed   By: Abigail Miyamoto M.D.   On: 04/02/2016 08:47   Ct Abdomen Pelvis W Contrast  Result Date: 04/01/2016 CLINICAL DATA:  Multiple falls. Hemorrhagic brain metastases seen on recent CT. Diabetes. EXAM: CT CHEST, ABDOMEN, AND PELVIS WITH CONTRAST TECHNIQUE: Multidetector CT imaging of the chest, abdomen and pelvis was performed following the standard protocol during bolus administration of intravenous contrast. CONTRAST:  18m ISOVUE-300 IOPAMIDOL (ISOVUE-300) INJECTION 61% COMPARISON:  AP CT on 07/25/2005 FINDINGS: CT CHEST FINDINGS Cardiovascular: Normal heart size. Mild LAD coronary artery calcification seen. Aortic atherosclerosis noted. Mediastinum/Lymph Nodes: No pathologically enlarged lymph nodes identified. Lungs/Pleura: An irregular pulmonary masses seen in the medial left upper lobe which shows invasion of adjacent mediastinal fat. This measures 3.7 x 2.9 cm on image 13/2. This is suspicious for primary bronchogenic carcinoma. 5 mm perifissural nodule in the right lung along the minor fissure on image  72/series 7 is most consistent with a benign intrapulmonary lymph node. No evidence of pleural effusion. Musculoskeletal: No chest wall mass or suspicious bone lesions identified. CT ABDOMEN PELVIS FINDINGS Hepatobiliary: Moderate hepatic steatosis is seen but decreased since previous study. No liver masses identified. Prior cholecystectomy noted. No evidence of biliary dilatation. Pancreas: No mass, inflammatory changes, or other significant abnormality. Spleen: Within normal limits in size and appearance. Adrenals/Urinary Tract: Normal adrenal glands. 2.5 cm low-attenuation lesion is seen in the upper pole left kidney on image 20/8, and low-attenuation lesion measuring 2.2 cm is seen in the upper pole of the right kidney on image 23/8. These are new since previous study, are ill-defined, and do not meet criteria for benign cysts. Differential diagnosis includes renal metastases, renal cell carcinomas, and renal abscesses. No evidence of hydronephrosis. Unopacified urinary bladder is unremarkable in appearance. Stomach/Bowel: No evidence of obstruction, inflammatory process, or abnormal fluid collections. Vascular/Lymphatic: No pathologically enlarged lymph nodes. No evidence of abdominal aortic aneurysm. Aortic atherosclerosis noted. Reproductive: No mass or other significant abnormality. Other: None. Musculoskeletal:  No suspicious bone lesions identified. IMPRESSION: 3.7 cm mass in medial left upper lobe with adjacent mediastinal invasion. This is highly suspicious for primary bronchogenic carcinoma. No evidence of thoracic lymphadenopathy or pleural effusion. New ill-defined low-attenuation masses in both kidneys measuring 2-3 cm. Differential diagnosis includes renal metastases, primary renal cell carcinomas, and renal abscesses. Consider abdomen MRI without and with contrast for further characterization. No other sites of metastatic disease identified within the abdomen or pelvis. Decreased hepatic steatosis  since prior study. Aortic atherosclerosis and coronary artery calcification. Electronically Signed   By: JEarle GellM.D.   On: 04/01/2016 17:19   Mr Shoulder Right Wo Contrast  Result Date: 04/26/2016 CLINICAL DATA:  Metastatic lung cancer.  Right shoulder pain. EXAM: MRI OF THE RIGHT SHOULDER WITHOUT CONTRAST TECHNIQUE: Multiplanar, multisequence MR imaging of the shoulder was performed. No intravenous contrast was administered. COMPARISON:  None. FINDINGS: Rotator cuff: Small full-thickness partial width anterior supraspinatus tear, image 13/ 4. Moderate supraspinatus tendinopathy with some partial thickness articular surface tearing and mild tendon delamination as on images 11 through 12 series 4. This causes thinning of the supraspinatus tendon. There is mild infraspinatus tendinopathy. Muscles: 3 masses are present in the lateral deltoid muscle, the largest 3.8 by 1.9 by 2.0 cm, with surrounding muscular edema compatible with muscular metastatic lesions. 1.9 by 2.2 by 4.7 cm mass in the teres minor muscle. 0.8 by 0.8 by 2.2 cm mass in the infraspinatus muscle. Edema tracks within along the distal trapezius muscle as it extends towards the distal clavicle. Biceps long head:  Unremarkable Acromioclavicular Joint: Abnormal edema in the distal clavicle suspicious for a metastatic lesion, with some cortical ill definition and surrounding soft tissue edema. There is a second bony lesion in the acromion posteriorly suspicious for metastatic lesion. Abnormal fluid in the subacromial subdeltoid bursa. Type II acromion. Glenohumeral Joint: Moderate degenerative chondral thinning. No joint effusion. Labrum:  Grossly unremarkable Bones: 1.4 by 1.3 cm speckled lesion with high T2 and low T1 signal characteristics in the humeral head adjacent to the greater tuberosity, probably degenerative, less likely metastatic. A chondroid lesion could also have this appearance. Other: No supplemental non-categorized findings.  IMPRESSION: 1. Full-thickness partial width anterior supraspinatus tear allowing fluid into the subacromial subdeltoid bursa. There is moderate supraspinatus tendinopathy also with some partial thickness articular surface tearing in the supraspinatus. Mild infraspinatus tendinopathy. 2. Scattered metastatic lesions are present in the muscular and skeletal structures of the right shoulder, including several lesions in the deltoid, a mass in the teres minor muscle, and a smaller mass in the infraspinatus muscle, all with surrounding muscular edema. Bony lesions occluded distal clavicular lesion and a proximal acromial lesion. 3. Nonspecific 1.4 by 1.3 cm lesion in the humeral head may be degenerative or less likely metastatic. Electronically Signed   By: Van Clines M.D.   On: 04/26/2016 22:25   Ct Biopsy  Result Date: 04/03/2016 INDICATION: 65 year old female with a history of metastatic carcinoma. MR demonstrates lesion of the left psoas muscle. She has been referred for biopsy. EXAM: CT BIOPSY MEDICATIONS: None. ANESTHESIA/SEDATION: Moderate (conscious) sedation was employed during this procedure. A total of Versed 2.0 mg and Fentanyl 50 mcg was administered intravenously. Moderate Sedation Time: 12 minutes. The patient's level of consciousness and vital signs were monitored continuously by radiology nursing throughout the procedure under my direct supervision. FLUOROSCOPY TIME:  CT COMPLICATIONS: None PROCEDURE: Informed written consent was obtained from the patient after a thorough discussion of the procedural risks, benefits and alternatives. All questions were addressed. Maximal Sterile Barrier Technique was utilized including caps, mask, sterile gowns, sterile gloves, sterile drape, hand hygiene and skin antiseptic. A timeout was performed prior to the initiation of the procedure. Patient position prone position on CT gantry table. Scout CT was performed of the psoas musculature. The patient is then  prepped and draped in the usual sterile fashion. The skin and subcutaneous tissues were generously infiltrated with 1% lidocaine for local anesthesia. A small stab incision was made with an 11 blade scalpel. Using CT guidance, 17 gauge guide needle was advanced into the targeted contour abnormality of the left psoas muscle. Multiple 18 gauge core biopsy were achieved. Patient tolerated the procedure well and remained  hemodynamically stable throughout. No complications were encountered and no significant blood loss encountered. IMPRESSION: Status post CT-guided biopsy of left psoas lesion, targeting contour abnormality of the psoas muscle. Tissue specimen sent to pathology for complete histopathologic analysis. Signed, Dulcy Fanny. Earleen Newport, DO [facility] Radiology Electronically Signed   By: Corrie Mckusick D.O.   On: 04/03/2016 17:02   Dg Chest Port 1 View  Result Date: 04/05/2016 CLINICAL DATA:  Fever, type II diabetes mellitus, hypertension, intermittent confusion and multiple falls, CNS metastases suspected with LEFT upper lobe mass identified by CT EXAM: PORTABLE CHEST 1 VIEW COMPARISON:  Portable exam 2011 hours compared to 04/01/2016 FINDINGS: Normal heart size, mediastinal contours, and pulmonary vascularity normal. Medial LEFT upper lobe mass identified on prior radiographic and CT exams less well delineated on current study. Minimal LEFT basilar atelectasis. Lungs otherwise clear. No definite pleural effusion or pneumothorax. Bones demineralized. IMPRESSION: LEFT basilar atelectasis. LEFT upper lobe mass poorly visualized on current study. Electronically Signed   By: Lavonia Dana M.D.   On: 04/05/2016 20:48    ASSESSMENT & PLAN:  Metastatic lung cancer to brain The patient has completed radiation treatment Would defer systemic treatment to outpatient She is currently on dexamethasone  Severe right shoulder pain secondary to muscle and bone  metastasis She has poorly controlled pain We discussed pain management I recommend trial of fentanyl patch. She agreed to proceed I plan to reassess pain control tomorrow  Leukocytosis Due to dexamethasone Consider stopping antibiotics if cultures remain negative  Poorly controlled diabetes This is likely secondary to steroid therapy Continue insulin regimen  Protein calorie malnutrition Recommend dietitian review I encouraged the patient to increase oral intake as tolerated  Constipation Secondary to immobility and pain medicine I will start her on schedule Miralax  Severe anemia Multi factorial, likely due to anemia of chronic illness and possible GI bleed I will order additional workup tomorrow Recommend blood transfusion to keep hemoglobin greater than 8 g  Significant deconditioning and weakness Likely secondary to underlying disease Once her pain is better controlled, recommend physical therapy  Discharge planning Not ready to be discharged due to ongoing unresolved issues as above I will return to check on the patient tomorrow Consider palliative care consult for symptom management  All questions were answered. The patient knows to call the clinic with any problems, questions or concerns.    [facility], [place], MD 04/27/2016 7:12 PM

## 2016-04-27 NOTE — Progress Notes (Signed)
Blood transfusion completed. Dr. Alvy Bimler is at bedside and ok to obtain H&H in the morning with pt's scheduled labs. No H&H will be ordered for tonight.

## 2016-04-27 NOTE — Progress Notes (Signed)
Chaplain encountered pt daughters and son in law while rounding in hospital.  Familiar with family from previous admission.  Provided support around readmission, family's concerns about care at SNF, progression of treatment, exhaustion.    Will follow on this admission to assess for spiritual care needs.  Discussed case with Lamount Cohen, couples and family counseling intern for possible support.      [place], [place]

## 2016-04-27 NOTE — Progress Notes (Signed)
OT Cancellation Note  Patient Details Name: [name] MRN: [id number] DOB: 05-May-1951   Cancelled Treatment:    Reason Eval/Treat Not Completed: Medical issues which prohibited therapy;Fatigue/lethargy limiting ability to participate;Pain limiting ability to participate. Pt states that her R shoulder is painful and that she ia also not up to therapy right now. Pt's Hgb also at 6.7 and is to receive blood transfusion today. Will check back later as able/as appropriate  Britt Bottom 04/27/2016, 9:34 AM

## 2016-04-28 LAB — CBC WITH DIFFERENTIAL/PLATELET
BASOS ABS: 0 10*3/uL (ref 0.0–0.1)
BASOS PCT: 0 %
EOS PCT: 0 %
Eosinophils Absolute: 0.1 10*3/uL (ref 0.0–0.7)
HEMATOCRIT: 27.9 % — AB (ref 36.0–46.0)
Hemoglobin: 9.5 g/dL — ABNORMAL LOW (ref 12.0–15.0)
LYMPHS PCT: 2 %
Lymphs Abs: 0.3 10*3/uL — ABNORMAL LOW (ref 0.7–4.0)
MCH: 29 pg (ref 26.0–34.0)
MCHC: 34.1 g/dL (ref 30.0–36.0)
MCV: 85.1 fL (ref 78.0–100.0)
Monocytes Absolute: 1 10*3/uL (ref 0.1–1.0)
Monocytes Relative: 6 %
NEUTROS ABS: 15.9 10*3/uL — AB (ref 1.7–7.7)
Neutrophils Relative %: 92 %
PLATELETS: 77 10*3/uL — AB (ref 150–400)
RBC: 3.28 MIL/uL — AB (ref 3.87–5.11)
RDW: 15 % (ref 11.5–15.5)
WBC: 17.3 10*3/uL — AB (ref 4.0–10.5)

## 2016-04-28 LAB — FERRITIN: Ferritin: 1162 ng/mL — ABNORMAL HIGH (ref 11–307)

## 2016-04-28 LAB — IRON AND TIBC
IRON: 29 ug/dL (ref 28–170)
Saturation Ratios: 15 % (ref 10.4–31.8)
TIBC: 199 ug/dL — ABNORMAL LOW (ref 250–450)
UIBC: 170 ug/dL

## 2016-04-28 LAB — RETICULOCYTES
RBC.: 3.28 MIL/uL — AB (ref 3.87–5.11)
RETIC COUNT ABSOLUTE: 134.5 10*3/uL (ref 19.0–186.0)
Retic Ct Pct: 4.1 % — ABNORMAL HIGH (ref 0.4–3.1)

## 2016-04-28 LAB — VITAMIN B12: Vitamin B-12: 459 pg/mL (ref 180–914)

## 2016-04-28 LAB — GLUCOSE, RANDOM: Glucose, Bld: 383 mg/dL — ABNORMAL HIGH (ref 65–99)

## 2016-04-28 MED ORDER — OXYCODONE HCL 5 MG PO TABS
5.0000 mg | ORAL_TABLET | ORAL | Status: DC | PRN
  Administered 2016-04-28 – 2016-04-29 (×3): 5 mg via ORAL
  Filled 2016-04-28 (×3): qty 1

## 2016-04-28 MED ORDER — INSULIN GLARGINE 100 UNIT/ML ~~LOC~~ SOLN
12.0000 [IU] | Freq: Every day | SUBCUTANEOUS | Status: DC
  Filled 2016-04-28: qty 0.12

## 2016-04-28 MED ORDER — INSULIN GLARGINE 100 UNIT/ML ~~LOC~~ SOLN
17.0000 [IU] | Freq: Every day | SUBCUTANEOUS | Status: DC
  Administered 2016-04-28: 17 [IU] via SUBCUTANEOUS
  Filled 2016-04-28: qty 0.17

## 2016-04-28 MED ORDER — INSULIN ASPART 100 UNIT/ML ~~LOC~~ SOLN
0.0000 [IU] | Freq: Three times a day (TID) | SUBCUTANEOUS | Status: DC
  Administered 2016-04-28: 10 [IU] via SUBCUTANEOUS
  Administered 2016-04-29: 5 [IU] via SUBCUTANEOUS
  Administered 2016-04-29: 8 [IU] via SUBCUTANEOUS
  Administered 2016-04-29 – 2016-04-30 (×2): 15 [IU] via SUBCUTANEOUS
  Administered 2016-04-30: 11 [IU] via SUBCUTANEOUS
  Administered 2016-04-30: 8 [IU] via SUBCUTANEOUS
  Administered 2016-05-01: 5 [IU] via SUBCUTANEOUS
  Administered 2016-05-01: 15 [IU] via SUBCUTANEOUS
  Administered 2016-05-01: 8 [IU] via SUBCUTANEOUS
  Administered 2016-05-02: 3 [IU] via SUBCUTANEOUS

## 2016-04-28 NOTE — Progress Notes (Addendum)
Patient ID: [name], female   DOB: 22-Apr-1951, 65 y.o.   MRN: [id number]    PROGRESS NOTE    YEMAYA BARNIER  YYQ:[id number] DOB: May 08, 1951 DOA: 04/25/2016  PCP: Robert Bellow, MD   Brief Narrative:  65 y.o. 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 Found to have elevated WBC and lactate so given fluids and started on antibiotics which have now been discontinued. Found to have right rotator cuff tear as well as blood loss vs malignancy related anemia. Now s/p 3 total units transfused, seen by oncology and orthopedic surgery on 9/9.  Assessment & Plan:   1. Acute GI bleed, anemia of chronic disease, thrombocytopenia  - unclear etiology, pt had one U PRBC transfused on admission, Hg up from 6.7 --> 7.4 - transfused another 2 units 9/8 due to falling hemoglobin, but no signs/symptoms of bleeding - FOBT+, case was discussed with GI a couple of times and they wouldn't do any endoscopy unless obvious signs of active bleeding like melena and falling hemoglobin - will monitor blood count daily, avoid blood thinners, transfuse to keep Hb > 8  2. Sepsis of unclear etiology  - please note that pt met criteria for sepsis on admission with WBC 20 K, HR up to 107, immunocompromised pt and with SBP in 80's - note she has been on steroids and hypotension could be from anemia - BP stabilized after > 2 L NS and possible urinary source - was started on broad spectrum abx vanc and maxipime, total 3 days - all antibiotics were stopped on 9/9  3. Lung mass with brain metastasis  - 3.7 cm mass noted in medial LUL on CT with invasion into mediastinum (recent CT in August 2017) - Suspected to be a primary bronchogenic carcinoma  - Pt endorses FHx of lung cancer and has significant secondary smoke exposure from husband - per daughter  patient was seen last hospital stay by Dr. Julien Nordmann, has appointment next week - will consult oncology today to guide management, and help with goals of care/treatment options - Multiple brain hemorrhages also noted on recent MRI in August 2017, largest of which is 2.8 x 2.5 x 2.6 cm at the genu of Rt internal capsule with mass effect on the third ventricle and midline shift of 7 mm  - pt follows with Dr. Lisbeth Renshaw for radiation therapy and has completed intracranial and thoracic radiation - she is on dexamethasone in the hospital at this time - seen by Dr. Alvy Bimler on 9/8, appreciate her input greatly - I also discussed with Dr. Rowe Pavy of palliative medicine today, she will be seen this weekend for symptom management and goals of care discussion  4. Renal masses  - Bilateral renal masses noted on recent CT also in august 2017, possibly metastatic  - Abdomen MRI with & without contrast in August 2017 was notable for enhancing intramuscular lesions most consistent with metastasis, presumably from the patient's primary lung cancer.  5. Type II DM  - A1c was 7.2% in 2011  - Managed at home with insulin lantus and metformin - will keep on SSI until oral intake improved  - Check CBG q4h; change to with meals and qHS once appropriate for a diet  - Moderate-intensity SSI correctional to start; will adjust prn  - will increase Lantus today to 12 units qHS  6.  Right shoulder pain  - after recent fall - MRI of the shoulder shows Full-thickness partial width anterior supraspinatus tear allowing fluid into the subacromial subdeltoid bursa - doubt there is much to do other than supportive care, orthopedics has seen and plans steroid injection this weekend to help alleviate pain - pain control: started on Fentanyl patch 9/8, will add PO oxycodone today.  7. GERD - GI recommended PPI BID   DVT prophylaxis: SCD's  Code Status: Full  Family Communication: Family updated at bedside  (daughter)  Disposition Plan: Likely to need SNF, but to be determined. Will consult PT today to start to get her moving.  Consultants:   GI (over the phone)  Oncology  Orthopedics  Procedures:   None  Antimicrobials:   Vanco/Cefepime   Subjective: No events overnight, pt reports pain might be slightly better this morning after starting Fentanyl patch. Daughter at bedside this AM feeding her breakfast. Denies blood in urine or stool, no vomiting.  Objective: Vitals:   04/27/16 1810 04/27/16 1947 04/27/16 2010 04/28/16 0414  BP: 128/72  129/81 123/81  Pulse: 78  87 81  Resp: _0 Temp: 97.9 F (36.6 C)  99.5 F (37.5 C) 99.6 F (37.6 C)  TempSrc: Oral  Oral Oral  SpO2:  96% 97% 96%  Weight:      Height:        Intake/Output Summary (Last 24 hours) at 04/28/16 0854 Last data filed at 04/28/16 0400  Gross per 24 hour  Intake           1377.5 ml  Output                0 ml  Net           1377.5 ml   Filed Weights   04/25/16 2316  Weight: 72.6 kg (160 lb 0.9 oz)    Examination:  General exam: Appears calm and comfortable  Respiratory system: Respiratory effort normal. Cardiovascular system: S1 & S2 heard, RRR. No JVD, murmurs, rubs, gallops or clicks. No pedal edema. Gastrointestinal system: Abdomen is nondistended, soft and nontender. No organomegaly or masses felt.  Central nervous system: Alert and oriented. No focal neurological deficits. MSK: Pain with passive movement of right shoulder  Data Reviewed: I have personally reviewed following labs and imaging studies  CBC:  Recent Labs Lab 04/25/16 1700 04/25/16 1708 04/25/16 2315 04/26/16 0710 04/27/16 0502 04/28/16 0514  WBC 20.1*  --  17.5* 15.2* 14.2* 17.3*  NEUTROABS 18.2*  --   --   --   --  15.9*  HGB 7.8* 8.5* 6.7* 7.4* 6.7* 9.5*  HCT 24.4* 25.0* 20.9* 22.7* 20.7* 27.9*  MCV 84.1  --  87.1 85.0 87.0 85.1  PLT 161  --  141* 120* 105* 77*   Basic Metabolic Panel:  Recent  Labs Lab 04/25/16 1700 04/25/16 1708 04/26/16 0710 04/27/16 0502  NA 134* 134* 137 133*  K 3.9 3.9 3.8 3.8  CL 104 100* 109 104  CO2 21*  --  21* 20*  GLUCOSE 211* 209* 210* 257*  BUN 28* 24* 21* 16  CREATININE 0.75 0.70 0.57 0.61  CALCIUM 8.9  --  8.0* 7.9*  MG 1.8  --   --   --    Liver Function Tests:  Recent Labs Lab 04/25/16 1700  AST 14*  ALT 16  ALKPHOS 77  BILITOT 1.5*  PROT 6.1*  ALBUMIN 2.7*    Recent Labs Lab 04/25/16 1700  LIPASE 19   CBG:  Recent Labs Lab 04/27/16 0004 04/27/16 0514 04/27/16 0744 04/27/16 1130 04/27/16 1534  GLUCAP 221* 266* 238* 300* 283*   Urine analysis:    Component Value Date/Time   COLORURINE AMBER (A) 04/25/2016 1613   APPEARANCEUR CLOUDY (A) 04/25/2016 1613   LABSPEC 1.027 04/25/2016 1613   PHURINE 5.5 04/25/2016 1613   GLUCOSEU NEGATIVE 04/25/2016 1613   HGBUR NEGATIVE 04/25/2016 1613   BILIRUBINUR SMALL (A) 04/25/2016 1613   KETONESUR NEGATIVE 04/25/2016 1613   PROTEINUR NEGATIVE 04/25/2016 1613   UROBILINOGEN 0.2 01/21/2012 1145   NITRITE NEGATIVE 04/25/2016 1613   LEUKOCYTESUR SMALL (A) 04/25/2016 1613   Recent Results (from the past 240 hour(s))  MRSA PCR Screening     Status: None   Collection Time: 04/26/16 12:07 AM  Result Value Ref Range Status   MRSA by PCR NEGATIVE NEGATIVE Final      Radiology Studies: Mr Shoulder Right Wo Contrast  Result Date: 04/26/2016 CLINICAL DATA:  Metastatic lung cancer.  Right shoulder pain. EXAM: MRI OF THE RIGHT SHOULDER WITHOUT CONTRAST TECHNIQUE: Multiplanar, multisequence MR imaging of the shoulder was performed. No intravenous contrast was administered. COMPARISON:  None. FINDINGS: Rotator cuff: Small full-thickness partial width anterior supraspinatus tear, image 13/ 4. Moderate supraspinatus tendinopathy with some partial thickness articular surface tearing and mild tendon delamination as on images 11 through 12 series 4. This causes thinning of the supraspinatus  tendon. There is mild infraspinatus tendinopathy. Muscles: 3 masses are present in the lateral deltoid muscle, the largest 3.8 by 1.9 by 2.0 cm, with surrounding muscular edema compatible with muscular metastatic lesions. 1.9 by 2.2 by 4.7 cm mass in the teres minor muscle. 0.8 by 0.8 by 2.2 cm mass in the infraspinatus muscle. Edema tracks within along the distal trapezius muscle as it extends towards the distal clavicle. Biceps long head:  Unremarkable Acromioclavicular Joint: Abnormal edema in the distal clavicle suspicious for a metastatic lesion, with some cortical ill definition and surrounding soft tissue edema. There is a second bony lesion in the acromion posteriorly suspicious for metastatic lesion. Abnormal fluid in the subacromial subdeltoid bursa. Type II acromion. Glenohumeral Joint: Moderate degenerative chondral thinning. No joint effusion. Labrum:  Grossly unremarkable Bones: 1.4 by 1.3 cm speckled lesion with high T2 and low T1 signal characteristics in the humeral head adjacent to the greater tuberosity, probably degenerative, less likely metastatic. A chondroid lesion could also have this appearance. Other: No supplemental non-categorized findings. IMPRESSION: 1. Full-thickness partial width anterior supraspinatus tear allowing fluid into the subacromial subdeltoid bursa. There is moderate supraspinatus tendinopathy also with some partial thickness articular surface tearing in the supraspinatus. Mild infraspinatus tendinopathy. 2. Scattered metastatic lesions are present in the muscular and skeletal structures of the right shoulder, including several lesions in the deltoid, a mass in the teres minor muscle, and a smaller mass in the infraspinatus muscle, all with surrounding muscular edema. Bony lesions occluded distal clavicular lesion and a proximal acromial lesion. 3. Nonspecific 1.4 by 1.3 cm lesion in the humeral head may be degenerative or less likely metastatic. Electronically Signed   By:  Van Clines M.D.   On: 04/26/2016 22:25      Scheduled Meds: . sodium chloride   Intravenous Once  . albuterol  2.5 mg Nebulization Daily  . dexamethasone  4 mg Intravenous Q12H  . fentaNYL  25 mcg Transdermal Q72H  . insulin aspart  0-9 Units Subcutaneous TID WC  . insulin glargine  12 Units Subcutaneous  Q2200  . magic mouthwash  5 mL Oral QID  . mouth rinse  15 mL Mouth Rinse BID  . mometasone-formoterol  2 puff Inhalation BID  . [START ON 04/29/2016] pantoprazole  40 mg Intravenous Q12H  . polyethylene glycol  17 g Oral Daily  . PRUTECT  1 application Topical Daily  . tacrolimus  1 application Topical BID   Continuous Infusions:     LOS: 3 days    Time spent: 24 minutes    Deepak Bless Marry Guan, MD [facility] Pager [phone]  If 7PM-7AM, please contact night-coverage [email or web address] Password [email or web address] 04/28/2016, 8:54 AM

## 2016-04-28 NOTE — Progress Notes (Signed)
Blood glucose levels are not showing up in the computer for this patient. At 9/8 2009, BS was 317, at 9/8 2347, BS was 272, and at 9/9 0411, BS 314

## 2016-04-28 NOTE — Progress Notes (Signed)
Patient ID: [name], female   DOB: 04/19/1951, 65 y.o.   MRN: [id number] I came by this am and did place a steroid injection into the subacromial space of her right shoulder.  This may help some in terms of treating pain from inflammation.  She will likely continue to have pain from the metastatic lesions unfortunately.  No further recs.

## 2016-04-28 NOTE — Clinical Social Work Note (Signed)
Clinical Social Work Assessment  Patient Details  Name: [name] MRN: [id number] Date of Birth: November 25, 1950  Date of referral:  04/28/16               Reason for consult:  Discharge Planning                Permission sought to share information with:  Family Supports, Customer service manager, Case Manager Permission granted to share information::     Name::      [name])  Agency::   [name] )  Relationship::   (Daughter )  Contact Information:   [phone])  Housing/Transportation Living arrangements for the past 2 months:  [place] of Information:  Adult Children Patient Interpreter Needed:  None Criminal Activity/Legal Involvement Pertinent to Current Situation/Hospitalization:  No - Comment as needed Significant Relationships:  Adult Children Lives with:  Adult Children Do you feel safe going back to the place where you live?  Yes Need for family participation in patient care:  Yes (Comment)  Care giving concerns: Patient admitted from SNF, [facility] where she has been a ST resident for almost 3 weeks. Daughter would like for patient to return once medically stable for dc.    Social Worker assessment / plan:  MSW spoke with patient's dtr, [name] in regards to pt being admitted for SNF. Pt's dtr confirmed pt was admitted from [facility] and plans to return once she is stable for dc. Pt's dtr expressed concerns from when pt was discharged on 8/19. FL-2 completed and faxed via [facility] to [facility]. No further concerns reported at this time. MSW remains available as needed.   Employment status:  Retired Nurse, adult PT Recommendations:  Not assessed at this time Information / Referral to community resources:  [place]  Patient/Family's Response to care:  Pt with memory impairment however alert and oriented x4. Pt's dtr agreeable to pt's return to  [place] once stable. Dtr supportive and strongly involved in pt's care.   Patient/Family's Understanding of and Emotional Response to Diagnosis, Current Treatment, and Prognosis:  Dtr knowledgeable of medical and surgical interventions past and present.   Emotional Assessment Appearance:  Appears stated age Attitude/Demeanor/Rapport:  Unable to Assess Affect (typically observed):  Unable to Assess Orientation:  Oriented to Self, Oriented to Place, Oriented to  Time, Oriented to Situation (memory impairment ) Alcohol / Substance use:  Not Applicable Psych involvement (Current and /or in the community):  No (Comment)  Discharge Needs  Concerns to be addressed:  Denies Needs/Concerns at this time Readmission within the last 30 days:  Yes Current discharge risk:  Dependent with Mobility Barriers to Discharge:  Continued Medical Work up   [employer], MSW [phone] 04/28/2016 1:07 PM

## 2016-04-28 NOTE — Progress Notes (Signed)
[name]   DOB:07-31-1951   WY#:[id number]    Subjective: She is sleeping comfortable after pain medication. Her sister, [name] provided history. Her oral intake was poor. She complained of severe shoulder pain. Remained constipation. She did not finish her Miralax  Objective:  Vitals:   04/28/16 0414 04/28/16 1401  BP: 123/81 (!) 141/89  Pulse: 81 (!) 110  Resp: 16 16  Temp: 99.6 F (37.6 C) 99.6 F (37.6 C)     Intake/Output Summary (Last 24 hours) at 04/28/16 1507 Last data filed at 04/28/16 1300  Gross per 24 hour  Intake             1230 ml  Output                0 ml  Net             1230 ml    GENERAL:Sleeping SKIN: skin color, texture, turgor are normal, no rashes or significant lesions Musculoskeletal:no cyanosis of digits and no clubbing    Labs:  Lab Results  Component Value Date   WBC 17.3 (H) 04/28/2016   HGB 9.5 (L) 04/28/2016   HCT 27.9 (L) 04/28/2016   MCV 85.1 04/28/2016   PLT 77 (L) 04/28/2016   NEUTROABS 15.9 (H) 04/28/2016    Lab Results  Component Value Date   NA 133 (L) 04/27/2016   K 3.8 04/27/2016   CL 104 04/27/2016   CO2 20 (L) 04/27/2016    Studies:  Mr Shoulder Right Wo Contrast  Result Date: 04/26/2016 CLINICAL DATA:  Metastatic lung cancer.  Right shoulder pain. EXAM: MRI OF THE RIGHT SHOULDER WITHOUT CONTRAST TECHNIQUE: Multiplanar, multisequence MR imaging of the shoulder was performed. No intravenous contrast was administered. COMPARISON:  None. FINDINGS: Rotator cuff: Small full-thickness partial width anterior supraspinatus tear, image 13/ 4. Moderate supraspinatus tendinopathy with some partial thickness articular surface tearing and mild tendon delamination as on images 11 through 12 series 4. This causes thinning of the supraspinatus tendon. There is mild infraspinatus tendinopathy. Muscles: 3 masses are present in the lateral deltoid muscle, the largest 3.8 by 1.9 by 2.0 cm, with surrounding muscular edema compatible with  muscular metastatic lesions. 1.9 by 2.2 by 4.7 cm mass in the teres minor muscle. 0.8 by 0.8 by 2.2 cm mass in the infraspinatus muscle. Edema tracks within along the distal trapezius muscle as it extends towards the distal clavicle. Biceps long head:  Unremarkable Acromioclavicular Joint: Abnormal edema in the distal clavicle suspicious for a metastatic lesion, with some cortical ill definition and surrounding soft tissue edema. There is a second bony lesion in the acromion posteriorly suspicious for metastatic lesion. Abnormal fluid in the subacromial subdeltoid bursa. Type II acromion. Glenohumeral Joint: Moderate degenerative chondral thinning. No joint effusion. Labrum:  Grossly unremarkable Bones: 1.4 by 1.3 cm speckled lesion with high T2 and low T1 signal characteristics in the humeral head adjacent to the greater tuberosity, probably degenerative, less likely metastatic. A chondroid lesion could also have this appearance. Other: No supplemental non-categorized findings. IMPRESSION: 1. Full-thickness partial width anterior supraspinatus tear allowing fluid into the subacromial subdeltoid bursa. There is moderate supraspinatus tendinopathy also with some partial thickness articular surface tearing in the supraspinatus. Mild infraspinatus tendinopathy. 2. Scattered metastatic lesions are present in the muscular and skeletal structures of the right shoulder, including several lesions in the deltoid, a mass in the teres minor muscle, and a smaller mass in the infraspinatus muscle, all with surrounding muscular edema. Bony lesions  occluded distal clavicular lesion and a proximal acromial lesion. 3. Nonspecific 1.4 by 1.3 cm lesion in the humeral head may be degenerative or less likely metastatic. Electronically Signed   By: Van Clines M.D.   On: 04/26/2016 22:25    Assessment & Plan:   Metastatic lung cancer to brain The patient has completed radiation treatment Would defer systemic treatment to  outpatient She is currently on dexamethasone  Severe right shoulder pain secondary to muscle and bone metastasis She has poorly controlled pain We discussed pain management I recommended trial of fentanyl patch. Her pain control is mildly improved Continue the same   Leukocytosis Due to dexamethasone Consider stopping antibiotics if cultures remain negative  Poorly controlled diabetes This is likely secondary to steroid therapy Continue insulin regimen  Protein calorie malnutrition Recommend dietitian review I encouraged the patient to increase oral intake as tolerated  Constipation Secondary to immobility and pain medicine I recommend scheduled Miralax  Severe anemia Multi factorial, likely due to anemia of chronic illness and possible GI bleed Workup showed no evidence of nutritional deficiency Recommend blood transfusion to keep hemoglobin greater than 8 g  Significant deconditioning and weakness Likely secondary to underlying disease Once her pain is better controlled, recommend physical therapy  Discharge planning Not ready to be discharged due to ongoing unresolved issues as above Consider palliative care consult for symptom management Dr. Julien Nordmann will follow next week to discuss further plan of care  [facility], [place], MD 04/28/2016  3:07 PM

## 2016-04-28 NOTE — Evaluation (Signed)
Physical Therapy Evaluation Patient Details Name: [name] MRN: [id number] DOB: [birth year] Today's Date: 04/28/2016   History of Present Illness  [name] is a 65 y.o. 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 Patient is being admitted for weakness and hypotension and possible GI bleed. Fell 2 weeks ago with right shoulder pain--MRI: Full-thickness partial width anterior supraspinatus tear as well as Scattered metastatic lesions in muscle and bones of shoulder  Clinical Impression  On eval, pt required Max assist +2 for bed mobility and squat pivot transfer. Attempted standing but pt was not able to safely perform task. Pt reported 7/10 R shoulder pain during session. Also noted pt to be very drowsy this session. Family present towards end of session. Discussed d/c plan-plan is for pt to return to SNF. Will follow and progress activity as tolerated.     Follow Up Recommendations SNF    Equipment Recommendations   (to be determined at SNF)    Recommendations for Other Services OT consult     Precautions / Restrictions Precautions Precautions: Fall Precaution Comments: R shoulder pain Restrictions Weight Bearing Restrictions: No      Mobility  Bed Mobility Overal bed mobility: Needs Assistance Bed Mobility: Sit to Supine     Sit to supine: Max assist;+2 for physical assistance;+2 for safety/equipment   General bed mobility comments: Assist for trunk and bil LEs. Utilized bedpad to aid with positioning  Transfers Overall transfer level: Needs assistance Equipment used: Rolling walker (2 wheeled) Transfers: Sit to/from [employer] Sit to Stand: Max assist;+2 physical assistance;+2 safety/equipment Stand pivot transfers: Max assist;+2 physical assistance;+2 safety/equipment       General transfer comment:  Attempted to stand but pt was unable to achieve full upright posture to allow for transfer.  Max +2 squat pivot, recliner > bed.   Ambulation/Gait                Stairs            Wheelchair Mobility    Modified Rankin (Stroke Patients Only)       Balance Overall balance assessment: Needs assistance Sitting-balance support: Single extremity supported;Feet supported Sitting balance-Leahy Scale: Poor Sitting balance - Comments: reliant on use of her LUE while seated EOB Postural control: Left lateral lean Standing balance support: Single extremity supported Standing balance-Leahy Scale: Zero Standing balance comment: reliant on her LUE for support and support of her RUE by therapist                             Pertinent Vitals/Pain Pain Assessment: 0-10 Pain Score: 7  Faces Pain Scale: Hurts even more Pain Location: R shoulder at rest-increased with movement Pain Descriptors / Indicators: Grimacing;Guarding Pain Intervention(s): Limited activity within patient's tolerance    Home Living Family/patient expects to be discharged to:: Skilled nursing facility                      Prior Function Level of Independence: Needs assistance   Gait / Transfers Assistance Needed: walking with PT at SNF with a RW           Hand Dominance        Extremity/Trunk Assessment   Upper Extremity Assessment: Defer to OT evaluation RUE Deficits / Details: see history of present illness; elbow  distally WNL. She does attempt to use her arm functionally (sometime shows pain and other times does not)         Lower Extremity Assessment: RLE deficits/detail;LLE deficits/detail RLE Deficits / Details: Difficult to assess due to decreased alertness/cognition. Difficulty maintaning extension when weightbearing LLE Deficits / Details: Difficult to assess due to decreased alertness/cognition. Difficulty maintainig extension when weightbearing  Cervical /  Trunk Assessment: Normal  Communication   Communication: No difficulties  Cognition Arousal/Alertness: Lethargic;Suspect due to medications Behavior During Therapy: Flat affect (difficulty keeping eyes open) Overall Cognitive Status: No family/caregiver present to determine baseline cognitive functioning Area of Impairment: Attention;Safety/judgement;Problem solving       Following Commands: Follows one step commands with increased time Safety/Judgement: Decreased awareness of deficits;Decreased awareness of safety   Problem Solving: Slow processing;Difficulty sequencing;Requires verbal cues;Requires tactile cues General Comments: multimodal cues required for all tasks    General Comments      Exercises        Assessment/Plan    PT Assessment Patient needs continued PT services  PT Diagnosis Difficulty walking;Generalized weakness;Acute pain;Altered mental status   PT Problem List Decreased strength;Decreased mobility;Pain;Decreased cognition;Decreased balance;Decreased knowledge of use of DME;Decreased activity tolerance;Decreased range of motion;Decreased coordination;Decreased safety awareness  PT Treatment Interventions DME instruction;Functional mobility training;Therapeutic activities;Therapeutic exercise;Balance training;Patient/family education;Gait training   PT Goals (Current goals can be found in the Care Plan section) Acute Rehab PT Goals Patient Stated Goal: pt was very drowsy.  PT Goal Formulation: With patient Time For Goal Achievement: 05/12/16 Potential to Achieve Goals: Fair    Frequency Min 3X/week   Barriers to discharge        Co-evaluation               End of Session Equipment Utilized During Treatment: Gait belt Activity Tolerance: Patient limited by pain;Patient limited by fatigue Patient left: in bed;with call bell/phone within reach;with chair alarm set           Time: 7972-8206 PT Time Calculation (min) (ACUTE ONLY): 19  min   Charges:   PT Evaluation $PT Eval Moderate Complexity: 1 Procedure     PT G Codes:        Weston Anna, MPT Pager: [phone]

## 2016-04-28 NOTE — NC FL2 (Signed)
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 Constipation 04/27/2016  . Right shoulder pain   . Bleeding gastrointestinal 04/25/2016  . Hypotension 04/25/2016  . Acute blood loss anemia 04/25/2016  . Cancer of bronchus of left upper lobe ([place]) 04/23/2016  . Metastatic lung cancer (metastasis from lung to other site) ([place]) 04/01/2016  . Brain metastases ([place]) 04/01/2016  . Bilateral renal masses 04/01/2016  . Falls 04/01/2016  . Confusion 04/01/2016  . Leukocytosis 04/01/2016  . Cerebral brain hemorrhage ([place]) 04/01/2016  . Metastatic primary lung cancer ([place]) 04/01/2016  . Hypertension 06/23/2012  . Snoring 04/12/2012  . Chest pain 04/10/2012  . Obesity 04/10/2012  . Hyperlipidemia 03/02/2011  . Diabetes mellitus type II, non insulin dependent ([place]) 08/01/2010  . ASTHMA UNSPECIFIED WITH EXACERBATION 01/24/2010  . PALPITATIONS 01/24/2010  . ARRHYTHMIA, HX OF 01/24/2010  . HEMORRHOIDS 06/21/2009  . RECTAL BLEEDING 06/21/2009  . Paroxysmal supraventricular tachycardia ([place]) 07/27/2008  . Asthma, mild persistent 07/27/2008  . GERD 07/27/2008    Orientation RESPIRATION BLADDER Height & Weight     Self, Time, Situation, Place (memory impairment )  Normal Incontinent Weight: 160 lb 0.9 oz (72.6 kg) Height:   '5\' 2"'$  (157.5 cm)  BEHAVIORAL SYMPTOMS/MOOD NEUROLOGICAL BOWEL NUTRITION STATUS   (NONE )  (NONE ) Continent Diet (Carb Modified )  AMBULATORY STATUS COMMUNICATION OF NEEDS Skin   Extensive Assist Verbally Normal                       Personal Care Assistance Level of Assistance  Bathing, Feeding Bathing Assistance: Maximum assistance Feeding assistance: Limited assistance Dressing Assistance: Maximum assistance     Functional Limitations Info  Speech, Hearing, Sight Sight Info: Adequate Hearing Info: Adequate Speech Info: Adequate    SPECIAL CARE FACTORS FREQUENCY  PT (By licensed PT), OT (By licensed OT)     PT Frequency: 3 OT Frequency: 2            Contractures      Additional Factors Info  Code Status, Allergies Code Status Info: PARTIAL CODE  Allergies Info: N/A            Current Medications (04/28/2016):  This is the current hospital active medication list Current Facility-Administered Medications  Medication Dose Route Frequency Provider Last Rate Last Dose  . 0.9 %  sodium chloride infusion   Intravenous Once Gardiner Barefoot, NP      . acetaminophen (TYLENOL) tablet 650 mg  650 mg Oral Q6H PRN [name], MD       Or  . acetaminophen (TYLENOL) suppository 650 mg  650 mg Rectal Q6H PRN [name], MD      . albuterol (PROVENTIL) (2.5 MG/3ML) 0.083% nebulizer solution 2.5 mg  2.5 mg Nebulization Daily Mir [place],  MD   2.5 mg at 04/28/16 1045  . albuterol (PROVENTIL) (2.5 MG/3ML) 0.083% nebulizer solution 3 mL  3 mL Inhalation Q6H PRN [name], MD   3 mL at 04/27/16 1946  . dexamethasone (DECADRON) injection 4 mg  4 mg Intravenous Q12H [name], MD   4 mg at 04/28/16 1040  . fentaNYL (DURAGESIC - dosed mcg/hr) patch 25 mcg  25 mcg Transdermal Q72H [name], MD   25 mcg at 04/27/16 2026  . insulin aspart (novoLOG) injection 0-9 Units  0-9 Units Subcutaneous TID WC [name], MD   9 Units  at 04/28/16 1134  . insulin glargine (LANTUS) injection 12 Units  12 Units Subcutaneous Q2200 Mir Marry Guan, MD      . LORazepam (ATIVAN) injection 0.5 mg  0.5 mg Intravenous Q8H PRN Theodis Blaze, MD      . magic mouthwash  5 mL Oral QID Gardiner Barefoot, NP   5 mL at 04/28/16 1041  . MEDLINE mouth rinse  15 mL Mouth Rinse BID [name], MD   15 mL at 04/28/16 1041  . mometasone-formoterol (DULERA) 200-5 MCG/ACT inhaler 2 puff  2 puff Inhalation BID [name], MD   2 puff at 04/28/16 1050  . morphine 2 MG/ML injection 1 mg  1 mg Intravenous Q4H PRN Gardiner Barefoot, NP   1 mg at 04/28/16 0644  . morphine 2 MG/ML injection 2 mg  2 mg Intravenous Q3H PRN Theodis Blaze, MD   2 mg at 04/28/16 1134  . ondansetron (ZOFRAN) tablet 4 mg  4 mg Oral Q6H PRN [name], MD       Or  . ondansetron [facility]) injection 4 mg  4 mg Intravenous Q6H PRN [name], MD      . oxyCODONE (Oxy IR/ROXICODONE) immediate release tablet 5 mg  5 mg Oral Q4H PRN Mir Marry Guan, MD   5 mg at 04/28/16 1134  . [START ON 04/29/2016] pantoprazole (PROTONIX) injection 40 mg  40 mg Intravenous Q12H [name], MD      . polyethylene glycol (MIRALAX / GLYCOLAX) packet 17 g  17 g Oral Daily [name], MD   17 g at 04/28/16 1040  . PRUTECT emulsion 1 application  1 application Topical Daily [name], MD   1 application at 02/58/52 1400  . tacrolimus (PROTOPIC) 0.1 % ointment 1 application  1 application Topical BID [name], MD         Discharge Medications: Please see discharge summary for a list of discharge medications.  Relevant Imaging Results:  Relevant Lab Results:   Additional Information SSN [id number]  Rozell Searing

## 2016-04-28 NOTE — Evaluation (Signed)
Occupational Therapy Evaluation Patient Details Name: [name] MRN: [id number] DOB: September 22, 1950 Today's Date: 04/28/2016    History of Present Illness [name] is a 65 y.o. 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 Patient is being admitted for weakness and hypotension and possible GI bleed. Fell 2 weeks ago with right shoulder pain--MRI: Full-thickness partial width anterior supraspinatus tear as well as Scattered metastatic lesions in muscle and bones of shoulder   Clinical Impression   This 65 yo female admitted with above presents to acute OT with deficits below (see OT problem list) thus affecting her ability to help A with her care. She will benefit from acute OT with continued follow up OT at SNF.    Follow Up Recommendations  SNF    Equipment Recommendations  Other (comment) (TBD at next venue)       Precautions / Restrictions Precautions Precautions: Fall Restrictions Weight Bearing Restrictions: No      Mobility Bed Mobility Overal bed mobility: Needs Assistance Bed Mobility: Rolling;Sidelying to Sit Rolling: Min assist (with VCs for sequencing with pt rolling to her left) Sidelying to sit: Mod assist          Transfers Overall transfer level: Needs assistance Equipment used:  (me standing in front of her with use of gait belt) Transfers: Sit to/from [employer] Transfers Sit to Stand: Mod assist Stand pivot transfers: Max assist       General transfer comment: Pt got her legs crossed up as we started to turn    Balance Overall balance assessment: Needs assistance Sitting-balance support: Single extremity supported;Feet supported Sitting balance-Leahy Scale: Poor Sitting balance - Comments: reliant on use of her LUE while seated EOB Postural control: Left lateral lean Standing balance support:  Single extremity supported Standing balance-Leahy Scale: Poor Standing balance comment: reliant on her LUE for support and support of her RUE by therapist                            ADL Overall ADL's : Needs assistance/impaired Eating/Feeding: Moderate assistance (supported sitting) Eating/Feeding Details (indicate cue type and reason): Not wanting to use her RUE (dominant) due to pain, not as coordinated with LUE and noted shaking of LUE as well when holding cup of apple juice Grooming: Moderate assistance (supported sitting)   Upper Body Bathing: Moderate assistance (supported sitting)   Lower Body Bathing: Maximal assistance (mod A sit<>stand)   Upper Body Dressing : Maximal assistance (supported sitting)   Lower Body Dressing: Total assistance (mod A sit<>stand)   Toilet Transfer: Maximal assistance;Stand-pivot Toilet Transfer Details (indicate cue type and reason): bed>recliner on her left (got her legs crossed up, making it difficult for her to turn Toileting- Clothing Manipulation and Hygiene: Total assistance (mod A sit<>stand)                         Pertinent Vitals/Pain Pain Assessment: Faces Faces Pain Scale: Hurts even more Pain Location: right shoulder with movement (but then at times she uses it spontaneously and no c/o pain) Pain Descriptors / Indicators: Grimacing;Guarding Pain Intervention(s): Monitored during session;Repositioned;Heat applied        Extremity/Trunk Assessment Upper Extremity Assessment Upper Extremity Assessment: RUE deficits/detail RUE Deficits / Details: see history of present illness; elbow distally WNL. She does attempt to use  her arm functionally (sometime shows pain and other times does not) RUE Coordination: decreased gross motor           Communication     Cognition Arousal/Alertness: Awake/alert Behavior During Therapy: WFL for tasks assessed/performed Overall Cognitive Status: Impaired/Different from  baseline Area of Impairment: Following commands;Safety/judgement;Problem solving       Following Commands: Follows one step commands with increased time Safety/Judgement: Decreased awareness of safety;Decreased awareness of deficits   Problem Solving: Slow processing;Difficulty sequencing;Requires verbal cues;Requires tactile cues                Home Living Family/patient expects to be discharged to:: Skilled nursing facility                                             OT Diagnosis: Generalized weakness;Acute pain;Cognitive deficits   OT Problem List: Decreased strength;Decreased range of motion;Decreased activity tolerance;Impaired balance (sitting and/or standing);Decreased coordination;Decreased cognition;Decreased safety awareness;Decreased knowledge of use of DME or AE;Impaired UE functional use;Pain   OT Treatment/Interventions: Self-care/ADL training;Therapeutic activities;Balance training;DME and/or AE instruction;Patient/family education    OT Goals(Current goals can be found in the care plan section) Acute Rehab OT Goals Patient Stated Goal: did not state anything specific OT Goal Formulation: With patient Time For Goal Achievement: 05/12/16 Potential to Achieve Goals: Fair  OT Frequency: Min 2X/week              End of Session Equipment Utilized During Treatment: Gait belt Nurse Communication: Mobility status;Need for lift equipment (RN and NT)  Activity Tolerance: Patient limited by fatigue Patient left: in chair;with call bell/phone within reach;with chair alarm set   Time: [phone] OT Time Calculation (min): 23 min Charges:  OT General Charges $OT Visit: 1 Procedure OT Evaluation $OT Eval Moderate Complexity: 1 Procedure OT Treatments $Self Care/Home Management : 8-22 mins  Almon Register [phone] 04/28/2016, 12:40 PM

## 2016-04-29 DIAGNOSIS — Z66 Do not resuscitate: Secondary | ICD-10-CM

## 2016-04-29 DIAGNOSIS — Z515 Encounter for palliative care: Secondary | ICD-10-CM

## 2016-04-29 DIAGNOSIS — Z7189 Other specified counseling: Secondary | ICD-10-CM

## 2016-04-29 LAB — BASIC METABOLIC PANEL
Anion gap: 8 (ref 5–15)
BUN: 27 mg/dL — AB (ref 6–20)
CHLORIDE: 104 mmol/L (ref 101–111)
CO2: 21 mmol/L — AB (ref 22–32)
CREATININE: 0.74 mg/dL (ref 0.44–1.00)
Calcium: 8.3 mg/dL — ABNORMAL LOW (ref 8.9–10.3)
GFR calc Af Amer: >60 mL/min (ref >60)
GFR calc non Af Amer: >60 mL/min (ref >60)
Glucose, Bld: 340 mg/dL — ABNORMAL HIGH (ref 65–99)
Potassium: 4.2 mmol/L (ref 3.5–5.1)
Sodium: 133 mmol/L — ABNORMAL LOW (ref 135–145)

## 2016-04-29 LAB — CBC
HCT: 23.8 % — ABNORMAL LOW (ref 36.0–46.0)
Hemoglobin: 8.1 g/dL — ABNORMAL LOW (ref 12.0–15.0)
MCH: 29 pg (ref 26.0–34.0)
MCHC: 34 g/dL (ref 30.0–36.0)
MCV: 85.3 fL (ref 78.0–100.0)
PLATELETS: 54 10*3/uL — AB (ref 150–400)
RBC: 2.79 MIL/uL — ABNORMAL LOW (ref 3.87–5.11)
RDW: 15.4 % (ref 11.5–15.5)
WBC: 18.4 10*3/uL — ABNORMAL HIGH (ref 4.0–10.5)

## 2016-04-29 MED ORDER — INSULIN GLARGINE 100 UNIT/ML ~~LOC~~ SOLN
20.0000 [IU] | Freq: Every day | SUBCUTANEOUS | Status: DC
  Administered 2016-04-29 – 2016-05-01 (×3): 20 [IU] via SUBCUTANEOUS
  Filled 2016-04-29 (×3): qty 0.2

## 2016-04-29 NOTE — Progress Notes (Signed)
Patient ID: ZOANNE NEWILL, female   DOB: 11/05/1950, 65 y.o.   MRN: [id number]    PROGRESS NOTE    ANTONIETTE PEAKE  CGX:[id number] DOB: [birth year] DOA: 04/25/2016  PCP: Milana Obey, MD   Brief Narrative:  65 y.o. 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 Found to have elevated WBC and lactate so given fluids and started on antibiotics which have now been discontinued. Found to have right rotator cuff tear as well as blood loss vs malignancy related anemia. Now s/p 3 total units transfused, seen by oncology and orthopedic surgery on 9/9. She had right shoulder injected 9/9, pain is somewhat better controlled.   Assessment & Plan:   1. Acute GI bleed, anemia of chronic disease, thrombocytopenia  - unclear etiology, pt had one U PRBC transfused on admission, Hg up from 6.7 --> 7.4 - transfused another 2 units 9/8 due to falling hemoglobin, but no signs/symptoms of bleeding - FOBT+, case was discussed with GI a couple of times and they wouldn't do any endoscopy unless obvious signs of active bleeding like melena and repeatedly falling hemoglobin - will monitor blood count daily, avoid blood thinners, transfuse to keep Hb > 8 - she may need another transfusion 9/11  2. Sepsis of unclear etiology  - please note that pt met criteria for sepsis on admission with WBC 20 K, HR up to 107, immunocompromised pt and with SBP in 80's - note she has been on steroids and hypotension could be from anemia/dehydration - BP stabilized after > 2 L NS and possible urinary source - was started on broad spectrum abx vanc and maxipime - all antibiotics were stopped on 9/9  3. Lung mass with brain metastasis  - 3.7 cm mass noted in medial LUL on CT with invasion into mediastinum (recent CT in August 2017) - Suspected to be a primary  bronchogenic carcinoma  - Pt endorses FHx of lung cancer and has significant secondary smoke exposure from husband - per daughter patient was seen last hospital stay by Dr. Arbutus Ped, has appointment next week - will consult oncology today to guide management, and help with goals of care/treatment options - Multiple brain hemorrhages also noted on recent MRI in August 2017, largest of which is 2.8 x 2.5 x 2.6 cm at the genu of Rt internal capsule with mass effect on the third ventricle and midline shift of 7 mm  - pt follows with Dr. Mitzi Hansen for radiation therapy and has completed intracranial and thoracic radiation - she is on dexamethasone in the hospital at this time - seen by Dr. Bertis Ruddy on 9/8, appreciate her input greatly - I also discussed with Dr. Linna Darner of palliative medicine, she will be seen this weekend for symptom management and goals of care discussion - this morning I had a long discussion with daughter while patient asleep, discussing likely poor prognosis and limited options for treatment. Of course we defer to Dr. Arbutus Ped who should be contacted Monday (Dr. Bertis Ruddy will talk to him, but may want to confirm)  4. Renal masses  - Bilateral renal masses noted on recent CT also in august 2017, possibly metastatic  - Abdomen MRI with & without contrast in August 2017 was notable for enhancing intramuscular lesions most consistent with metastasis, presumably from the patient's primary lung cancer - note she had previous psoas muscle biopsy  consistent with poorly differentiated carcinoma  5. Type II DM  - A1c was 7.2% in 2011  - Managed at home with insulin lantus and metformin - will keep on SSI until oral intake improved  - Check CBG q4h; change to with meals and qHS once appropriate for a diet  - Moderate-intensity SSI correctional to start; will adjust prn  - will increase Lantus today to 17 units qHS  6. Right shoulder pain  - after recent fall - MRI of the shoulder shows  Full-thickness partial width anterior supraspinatus tear allowing fluid into the subacromial subdeltoid bursa - doubt there is much to do other than supportive care, orthopedics has seen and performed steroid injection this weekend to help alleviate pain - pain control: started on Fentanyl patch 9/8, added PO oxycodone 9/9  7. GERD - GI recommended PPI BID   DVT prophylaxis: SCD's  Code Status: Full  Family Communication: Family updated at bedside (daughter)  Disposition Plan: Likely to need SNF, but to be determined. Will not be possible if she is planning chemotherapy. Anticipate PT once pain is better controlled. Husband and son are drug addicts per daughter and she can't go home and live with them.  Consultants:   GI (over the phone)  Oncology  Orthopedics  Procedures:   None  Antimicrobials:   Vanco/Cefepime discontinued 9/9  Subjective: No events overnight, daughter reports patient slept comfortably all night with no acute events. She's been quite sleepy on her pain medications.  Objective: Vitals:   04/28/16 1726 04/28/16 1937 04/28/16 2157 04/29/16 0457  BP: (!) 143/88  123/78 113/78  Pulse: (!) 107  (!) 112 (!) 108  Resp: _0 Temp:   98.8 F (37.1 C) 99.9 F (37.7 C)  TempSrc:   Oral Oral  SpO2: 95% 95% 93% 94%  Weight:      Height:        Intake/Output Summary (Last 24 hours) at 04/29/16 8309 Last data filed at 04/28/16 1300  Gross per 24 hour  Intake              240 ml  Output                0 ml  Net              240 ml   Filed Weights   04/25/16 2316  Weight: 72.6 kg (160 lb 0.9 oz)    Examination:  General exam: Appears calm and comfortable, asleep  Respiratory system: Respiratory effort normal. Cardiovascular system: S1 & S2 heard, RRR. No JVD, murmurs, rubs, gallops or clicks. No pedal edema. Gastrointestinal system: Abdomen is nondistended, soft and nontender. No organomegaly or masses felt.  Central nervous system:  Asleep. MSK: No edema  Data Reviewed: I have personally reviewed following labs and imaging studies  CBC:  Recent Labs Lab 04/25/16 1700  04/25/16 2315 04/26/16 0710 04/27/16 0502 04/28/16 0514 04/29/16 0449  WBC 20.1*  --  17.5* 15.2* 14.2* 17.3* 18.4*  NEUTROABS 18.2*  --   --   --   --  15.9*  --   HGB 7.8*  < > 6.7* 7.4* 6.7* 9.5* 8.1*  HCT 24.4*  < > 20.9* 22.7* 20.7* 27.9* 23.8*  MCV 84.1  --  87.1 85.0 87.0 85.1 85.3  PLT 161  --  141* 120* 105* 77* 54*  < > = values in this interval not displayed. Basic Metabolic Panel:  Recent Labs Lab 04/25/16 1700 04/25/16 1708  04/26/16 0710 04/27/16 0502 04/28/16 1759 04/29/16 0449  NA 134* 134* 137 133*  --  133*  K 3.9 3.9 3.8 3.8  --  4.2  CL 104 100* 109 104  --  104  CO2 21*  --  21* 20*  --  21*  GLUCOSE 211* 209* 210* 257* 383* 340*  BUN 28* 24* 21* 16  --  27*  CREATININE 0.75 0.70 0.57 0.61  --  0.74  CALCIUM 8.9  --  8.0* 7.9*  --  8.3*  MG 1.8  --   --   --   --   --    Liver Function Tests:  Recent Labs Lab 04/25/16 1700  AST 14*  ALT 16  ALKPHOS 77  BILITOT 1.5*  PROT 6.1*  ALBUMIN 2.7*    Recent Labs Lab 04/25/16 1700  LIPASE 19   CBG:  Recent Labs Lab 04/27/16 0004 04/27/16 0514 04/27/16 0744 04/27/16 1130 04/27/16 1534  GLUCAP 221* 266* 238* 300* 283*   Urine analysis:    Component Value Date/Time   COLORURINE AMBER (A) 04/25/2016 1613   APPEARANCEUR CLOUDY (A) 04/25/2016 1613   LABSPEC 1.027 04/25/2016 1613   PHURINE 5.5 04/25/2016 1613   GLUCOSEU NEGATIVE 04/25/2016 1613   HGBUR NEGATIVE 04/25/2016 1613   BILIRUBINUR SMALL (A) 04/25/2016 1613   KETONESUR NEGATIVE 04/25/2016 1613   PROTEINUR NEGATIVE 04/25/2016 1613   UROBILINOGEN 0.2 01/21/2012 1145   NITRITE NEGATIVE 04/25/2016 1613   LEUKOCYTESUR SMALL (A) 04/25/2016 1613   Recent Results (from the past 240 hour(s))  MRSA PCR Screening     Status: None   Collection Time: 04/26/16 12:07 AM  Result Value Ref Range  Status   MRSA by PCR NEGATIVE NEGATIVE Final      Radiology Studies: No results found.    Scheduled Meds: . sodium chloride   Intravenous Once  . albuterol  2.5 mg Nebulization Daily  . dexamethasone  4 mg Intravenous Q12H  . fentaNYL  25 mcg Transdermal Q72H  . insulin aspart  0-15 Units Subcutaneous TID WC  . insulin glargine  20 Units Subcutaneous Q2200  . magic mouthwash  5 mL Oral QID  . mouth rinse  15 mL Mouth Rinse BID  . mometasone-formoterol  2 puff Inhalation BID  . pantoprazole  40 mg Intravenous Q12H  . polyethylene glycol  17 g Oral Daily  . PRUTECT  1 application Topical Daily  . tacrolimus  1 application Topical BID   Continuous Infusions:     LOS: 4 days    Time spent: 31 minutes    Marites Nath Marry Guan, MD [facility] Pager [phone]  If 7PM-7AM, please contact night-coverage [email or web address] Password [facility] 04/29/2016, 8:33 AM

## 2016-04-29 NOTE — Consult Note (Addendum)
Consultation Note Date: 04/29/2016   Patient Name: [name]  DOB: [birth year]  MRN: [id number]  Age / Sex: 65 y.o., female  PCP: Lemmie Evens, MD Referring Physician: Mir Marry Guan,*  Reason for Consultation: Establishing goals of care  HPI/Patient Profile: 65 y.o. female admitted on 04/25/2016  Clinical Assessment and Goals of Care:  65 year old lady who lives in [place] with her husband. She has 3 children. Daughter [name] is her healthcare power of attorney at [phone]. Patient has a recent diagnosis of lung cancer metastatic to brain and kidneys. She was receiving radiation treatments. She has had ongoing decline, minimal to nil oral intake, weakness, was admitted with weakness and hypotension. Patient was found to have a right rotator cuff tear, acute blood loss anemia versus malignancy related anemia. Patient was given 3 units packed red blood cell transfusion. Patient had right shoulder injected by orthopedic surgery. Patient was also seen by oncology. Palliative care consultation for symptom management of pain, constipation and goals of care discussions.  Patient seen and examined. Elderly appearing lady resting in bed. Family members present at the bedside. I introduced myself and palliative care as follows: Palliative medicine is specialized medical care for people living with serious illness. It focuses on providing relief from the symptoms and stress of a serious illness. The goal is to improve quality of life for both the patient and the family.  Patient appears in mild-moderate distress. Discussed about pain management strategy. Agree with transdermal fentanyl. Continue IV morphine and as needed oxycodone immediate release. Watch for signs of aspiration. Patient not as awake alert at times. Continue current bowel regimen. Discussed about the patient's  current overall condition of ongoing decline. Cancer related cachexia and malnutrition discussed in detail. Discussed about artificial nutrition and hydration at end-of-life. QUESTIONS answered. Family is contemplating comfort measures and hospice. See recommendations below. Thank you for the consult. If deemed appropriate, recommend social work consult for residential hospice arrangements on 04-30-16. Daughter and sister present in the room inform me that the patient's husband unfortunately has mental health issues and substance use issues. Hence, daughter [name] has established HCPOA for the patient. Patient herself participated in DNR discussions and has elected DNR DNI.   HCPOA  daughter [name] [address] [place]    Code status now established as DNR DNI. Patient has recently discussed with her children about not ending up on life support at end of life.   Patient and family wish for full scope of comfort measures. They state that they are fully aware of the patient's widely metastatic condition. Discussed about comfort care- patient already on Trans dermal fentanyl, also on IV Morphine PRN, also on OxyIR prn. Also on Decadron, has Miralax ordered. If no BMs soon, then consider enema or suppository, no significant burden from constipation noted on initial encounter.   Patient and family don't think that transfer back to [facility] is appropriate on d/c. Briefly introduced Hospice and some information given regarding residential hospice facilities.  Family is interested in staying locally in [place] and are asking about [facility].   Palliative will follow up on 04-30-16. Await Dr Lew Dawes input. Reviewed and agree with Dr Calton Dach recommendations from this weekend.   Code Status/Advance Care Planning:  DNR    Symptom Management:    as above   Palliative Prophylaxis:   Bowel Regimen  Psycho-social/Spiritual:   Desire for further  Chaplaincy support:no  Additional Recommendations: Education on Hospice  Prognosis:   < 2 weeks  Discharge Planning: Hospice facility is recommended.        Primary Diagnoses: Present on Admission: . Bleeding gastrointestinal . Hypotension . Acute blood loss anemia . Cancer of bronchus of left upper lobe ([place]) . Brain metastases ([place]) . Asthma, mild persistent   I have reviewed the medical record, interviewed the patient and family, and examined the patient. The following aspects are pertinent.  Past Medical History:  Diagnosis Date  . Anxiety   . Asthma   . Bronchitis   . Depression   . Essential hypertension   . GERD (gastroesophageal reflux disease)   . Hyperlipidemia   . Lichen sclerosus   . Osteoarthritis   . SVT (supraventricular tachycardia) (H[facility])    No inducible arrhythmias by EP study 11/11  . Type 2 diabetes mellitus ([place])    Social History   Social History  . Marital status: Married    Spouse name: N/A  . Number of children: N/A  . Years of education: N/A   Social History Main Topics  . Smoking status: Former Smoker    Types: Cigarettes  . Smokeless tobacco: Never Used     Comment: quit in 1980's  . Alcohol use No  . Drug use: No     Comment: Prior history of drug use  . Sexual activity: Yes    Birth control/ protection: Post-menopausal   Other Topics Concern  . None   Social History Narrative   Married, 3 children. Lives in [place], unemployed.    Family History  Problem Relation Age of Onset  . Hypertension Father   . COPD Father   . Parkinson's disease Father   . Hypertension Mother    Scheduled Meds: . sodium chloride   Intravenous Once  . albuterol  2.5 mg Nebulization Daily  . dexamethasone  4 mg Intravenous Q12H  . fentaNYL  25 mcg Transdermal Q72H  . insulin aspart  0-15 Units Subcutaneous TID WC  . insulin glargine  20 Units Subcutaneous Q2200  . magic mouthwash  5 mL Oral QID  . mouth rinse  15 mL Mouth Rinse BID  .  mometasone-formoterol  2 puff Inhalation BID  . pantoprazole  40 mg Intravenous Q12H  . polyethylene glycol  17 g Oral Daily  . PRUTECT  1 application Topical Daily  . tacrolimus  1 application Topical BID   Continuous Infusions:  PRN Meds:.acetaminophen **OR** acetaminophen, albuterol, LORazepam, morphine injection, morphine injection, ondansetron **OR** ondansetron (ZOFRAN) IV, oxyCODONE Medications Prior to Admission:  Prior to Admission medications   Medication Sig Start Date End Date Taking? Authorizing Provider  acetaminophen (TYLENOL) 500 MG tablet Take 500 mg by mouth every 6 (six) hours as needed for mild pain.    Yes Historical Provider, MD  albuterol (PROAIR HFA) 108 (90 BASE) MCG/ACT inhaler Inhale 1 puff into the lungs every 6 (six) hours as needed for wheezing or shortness of breath.   Yes Historical Provider, MD  B-Complex TABS Take 1 tablet by mouth daily.  Yes Historical Provider, MD  Biotin 5000 MCG CAPS Take 1 capsule by mouth 2 (two) times daily.   Yes Historical Provider, MD  budesonide-formoterol (SYMBICORT) 160-4.5 MCG/ACT inhaler Inhale 2 puffs into the lungs 2 (two) times daily.   Yes Historical Provider, MD  Calcium-Magnesium-Vitamin D 200-100-33.3 MG-MG-UNIT CAPS Take 1 capsule by mouth daily.   Yes Historical Provider, MD  clobetasol (TEMOVATE) 0.05 % cream Apply 1 application topically 2 (two) times daily.    Yes Historical Provider, MD  dexamethasone (DECADRON) 4 MG tablet Take 1 tablet (4 mg total) by mouth every 8 (eight) hours. Patient taking differently: Take 4 mg by mouth 2 (two) times daily.  04/07/16  Yes Lavina Hamman, MD  DHA-EPA-Flaxseed Oil-Vitamin E (THERA TEARS NUTRITION PO) Take 1 drop by mouth daily.   Yes Historical Provider, MD  glipiZIDE (GLUCOTROL) 10 MG tablet Take 10 mg by mouth 2 (two) times daily.     Yes Historical Provider, MD  HYDROcodone-acetaminophen (NORCO/VICODIN) 5-325 MG tablet Take 1 tablet by mouth 2 (two) times daily as needed  for moderate pain. 04/07/16  Yes Lavina Hamman, MD  insulin aspart (NOVOLOG) 100 UNIT/ML injection Inject 0-20 Units into the skin 3 (three) times daily with meals. Patient taking differently: Inject 0-9 Units into the skin 3 (three) times daily with meals. 60-100 = 0 units 101-150 = 1 unit  151-200 = 2 units 201-250 = 3 units 251-300 = 5 units 301-350 = 7 units > 350 = 9 units > 400 CALL MD 04/07/16  Yes Lavina Hamman, MD  Insulin Glargine (LANTUS SOLOSTAR) 100 UNIT/ML Solostar Pen Inject 15 Units into the skin daily at 10 pm. 04/07/16  Yes Lavina Hamman, MD  LORazepam (ATIVAN) 0.5 MG tablet Take 1 tablet (0.5 mg total) by mouth every 8 (eight) hours as needed for anxiety or sleep. 04/07/16  Yes Lavina Hamman, MD  metFORMIN (GLUCOPHAGE) 850 MG tablet Take 850 mg by mouth 2 (two) times daily with a meal.    Yes Historical Provider, MD  metoprolol succinate (TOPROL XL) 25 MG 24 hr tablet Take 1 tablet (25 mg) in the am, and 1/2 tablet (12.5 mg) at bedtime Patient taking differently: Take 12.5-25 mg by mouth 2 (two) times daily. Takes '25mg'$  in the morning and 12.'5mg'$  at bedtime 01/24/16  Yes Satira Sark, MD  Multiple Vitamin (MULITIVITAMIN WITH MINERALS) TABS Take 1 tablet by mouth daily.   Yes Historical Provider, MD  Omega-3 Fatty Acids (FISH OIL) 1000 MG CAPS Take 1 capsule by mouth 2 (two) times daily.    Yes Historical Provider, MD  omeprazole (PRILOSEC) 20 MG capsule Take 20 mg by mouth every other day.    Yes Historical Provider, MD  prochlorperazine (COMPAZINE) 10 MG tablet Take 1 tablet (10 mg total) by mouth every 6 (six) hours as needed for nausea or vomiting. 04/13/16  Yes Kyung Rudd, MD  simvastatin (ZOCOR) 20 MG tablet Take 20 mg by mouth every evening.   Yes Historical Provider, MD  tacrolimus (PROTOPIC) 0.1 % ointment Apply 1 application topically 2 (two) times daily.   Yes Historical Provider, MD  Wound Dressings (SONAFINE) Apply 1 application topically daily. Apply after  radiation daily on scalp once skin becomes irritated, red,itchy 04/06/16  Yes Historical Provider, MD   No Known Allergies Review of Systems + for pain, weakness, lack of appetite.   Physical Exam Weak appearing lady, in mild to moderate distress Diminished breath sounds S1 S2 Abdomen soft, mildly  distended Trace edema Tenderness in R shoulder and upper arm area to palpation.   Vital Signs: BP 113/78 (BP Location: Left Arm)   Pulse (!) 108   Temp 99.9 F (37.7 C) (Oral)   Resp 18   Ht '5\' 2"'$  (1.575 m)   Wt 72.6 kg (160 lb 0.9 oz)   SpO2 94%   BMI 29.27 kg/m  Pain Assessment: 0-10   Pain Score: Asleep   SpO2: SpO2: 94 % O2 Device:SpO2: 94 % O2 Flow Rate: .   IO: Intake/output summary:  Intake/Output Summary (Last 24 hours) at 04/29/16 1158 Last data filed at 04/28/16 1300  Gross per 24 hour  Intake              240 ml  Output                0 ml  Net              240 ml    LBM: Last BM Date: 04/25/16 Baseline Weight: Weight: 72.6 kg (160 lb 0.9 oz) Most recent weight: Weight: 72.6 kg (160 lb 0.9 oz)     Palliative Assessment/Data:   Flowsheet Rows   Flowsheet Row Most Recent Value  Intake Tab  Referral Department  Hospitalist  Unit at Time of Referral  Oncology Unit  Palliative Care Primary Diagnosis  Cancer  Palliative Care Type  New Palliative care  Reason for referral  Pain, Non-pain Symptom, Clarify Goals of Care  Date first seen by Palliative Care  04/29/16  Clinical Assessment  Palliative Performance Scale Score  30%  Pain Max last 24 hours  5  Pain Min Last 24 hours  4  Dyspnea Max Last 24 Hours  5  Dyspnea Min Last 24 hours  4  Nausea Max Last 24 Hours  4  Nausea Min Last 24 Hours  3  Anxiety Max Last 24 Hours  4  Anxiety Min Last 24 Hours  3  Psychosocial & Spiritual Assessment  Palliative Care Outcomes  Patient/Family meeting held?  Yes  Who was at the meeting?  patient, daughter Orlin Hilding, sister, friend and bedside RN.     Palliative Care Outcomes  Clarified goals of care      Time In: 10.30 Time Out: 12 Time Total: 90 min  Greater than 50%  of this time was spent counseling and coordinating care related to the above assessment and plan.  Signed by: Loistine Chance, MD  [phone]  Please contact Palliative Medicine Team phone at [phone] for questions and concerns.  For individual provider: See Shea Evans

## 2016-04-30 ENCOUNTER — Ambulatory Visit: Payer: PPO

## 2016-04-30 DIAGNOSIS — M25511 Pain in right shoulder: Secondary | ICD-10-CM

## 2016-04-30 DIAGNOSIS — C3412 Malignant neoplasm of upper lobe, left bronchus or lung: Secondary | ICD-10-CM

## 2016-04-30 DIAGNOSIS — D62 Acute posthemorrhagic anemia: Secondary | ICD-10-CM

## 2016-04-30 LAB — TYPE AND SCREEN
ABO/RH(D): O POS
ANTIBODY SCREEN: NEGATIVE
UNIT DIVISION: 0
UNIT DIVISION: 0
Unit division: 0

## 2016-04-30 LAB — GLUCOSE, CAPILLARY
GLUCOSE-CAPILLARY: 272 mg/dL — AB (ref 65–99)
GLUCOSE-CAPILLARY: 321 mg/dL — AB (ref 65–99)
GLUCOSE-CAPILLARY: 368 mg/dL — AB (ref 65–99)
Glucose-Capillary: 317 mg/dL — ABNORMAL HIGH (ref 65–99)
Glucose-Capillary: 314 mg/dL — ABNORMAL HIGH (ref 65–99)
Glucose-Capillary: 301 mg/dL — ABNORMAL HIGH (ref 65–99)
Glucose-Capillary: 366 mg/dL — ABNORMAL HIGH (ref 65–99)
Glucose-Capillary: 411 mg/dL — ABNORMAL HIGH (ref 65–99)
Glucose-Capillary: 369 mg/dL — ABNORMAL HIGH (ref 65–99)
Glucose-Capillary: 299 mg/dL — ABNORMAL HIGH (ref 65–99)
Glucose-Capillary: 206 mg/dL — ABNORMAL HIGH (ref 65–99)
Glucose-Capillary: 176 mg/dL — ABNORMAL HIGH (ref 65–99)
Glucose-Capillary: 323 mg/dL — ABNORMAL HIGH (ref 65–99)

## 2016-04-30 LAB — BASIC METABOLIC PANEL
ANION GAP: 10 (ref 5–15)
BUN: 35 mg/dL — ABNORMAL HIGH (ref 6–20)
CALCIUM: 8.3 mg/dL — AB (ref 8.9–10.3)
CO2: 21 mmol/L — ABNORMAL LOW (ref 22–32)
Chloride: 104 mmol/L (ref 101–111)
Creatinine, Ser: 0.91 mg/dL (ref 0.44–1.00)
GFR calc Af Amer: >60 mL/min (ref >60)
GLUCOSE: 265 mg/dL — AB (ref 65–99)
Potassium: 4.5 mmol/L (ref 3.5–5.1)
SODIUM: 135 mmol/L (ref 135–145)

## 2016-04-30 LAB — CBC
HCT: 22.7 % — ABNORMAL LOW (ref 36.0–46.0)
HEMOGLOBIN: 7.4 g/dL — AB (ref 12.0–15.0)
MCH: 28.6 pg (ref 26.0–34.0)
MCHC: 32.6 g/dL (ref 30.0–36.0)
MCV: 87.6 fL (ref 78.0–100.0)
Platelets: 60 10*3/uL — ABNORMAL LOW (ref 150–400)
RBC: 2.59 MIL/uL — ABNORMAL LOW (ref 3.87–5.11)
RDW: 16.1 % — AB (ref 11.5–15.5)
WBC: 18.4 10*3/uL — AB (ref 4.0–10.5)

## 2016-04-30 LAB — PREPARE RBC (CROSSMATCH)

## 2016-04-30 MED ORDER — SODIUM CHLORIDE 0.9 % IV SOLN: Freq: Once | INTRAVENOUS | Status: DC

## 2016-04-30 NOTE — Progress Notes (Signed)
PT Cancellation Note  Patient Details Name: [name] MRN: [id number] DOB: 1950/11/01   Cancelled Treatment:    Reason Eval/Treat Not Completed: Medical issues which prohibited therapy (to receive PRBCs today per MD note, also discussing residential hospice)   York Ram E 04/30/2016, 3:23 PM Carmelia Bake, PT, DPT 04/30/2016 Pager: [phone]

## 2016-04-30 NOTE — Progress Notes (Signed)
Daily Progress Note   Patient Name: [name]       Date: 04/30/2016 DOB: Aug 04, 1951  Age: 65 y.o. MRN#: [id number] Attending Physician: Robbie Lis, MD Primary Care Physician: Robert Bellow, MD Admit Date: 04/25/2016  Reason for Consultation/Follow-up: Establishing goals of care Life limiting illness: widely metastatic lung cancer.   Subjective:  more awake alert resting in bed Family at bedside See below   Length of Stay: 5  Current Medications: Scheduled Meds:  . sodium chloride   Intravenous Once  . sodium chloride   Intravenous Once  . sodium chloride   Intravenous Once  . dexamethasone  4 mg Intravenous Q12H  . fentaNYL  25 mcg Transdermal Q72H  . insulin aspart  0-15 Units Subcutaneous TID WC  . insulin glargine  20 Units Subcutaneous Q2200  . magic mouthwash  5 mL Oral QID  . mouth rinse  15 mL Mouth Rinse BID  . mometasone-formoterol  2 puff Inhalation BID  . pantoprazole  40 mg Intravenous Q12H  . polyethylene glycol  17 g Oral Daily  . PRUTECT  1 application Topical Daily    Continuous Infusions:    PRN Meds: acetaminophen **OR** acetaminophen, albuterol, LORazepam, morphine injection, ondansetron **OR** ondansetron (ZOFRAN) IV  Physical Exam         Weak frail appearing S1 S2 Diminished Edema 2-3+ on RUE Now has foley Abdomen soft Awakens easily, answers some questions.   Vital Signs: BP 115/67 (BP Location: Left Arm)   Pulse (!) 104   Temp 98.6 F (37 C) (Oral)   Resp 18   Ht '5\' 2"'$  (1.575 m)   Wt 72.6 kg (160 lb 0.9 oz)   SpO2 94%   BMI 29.27 kg/m  SpO2: SpO2: 94 % O2 Device: O2 Device: Not Delivered O2 Flow Rate:    Intake/output summary:  Intake/Output Summary (Last 24 hours) at 04/30/16 1157 Last data filed at 04/30/16  0501  Gross per 24 hour  Intake                0 ml  Output              450 ml  Net             -450 ml   LBM: Last BM Date: 04/25/16 Baseline  Weight: Weight: 72.6 kg (160 lb 0.9 oz) Most recent weight: Weight: 72.6 kg (160 lb 0.9 oz)       Palliative Assessment/Data:    Flowsheet Rows   Flowsheet Row Most Recent Value  Intake Tab  Referral Department  Hospitalist  Unit at Time of Referral  Oncology Unit  Palliative Care Primary Diagnosis  Cancer  Date Notified  04/28/16  Palliative Care Type  New Palliative care  Reason for referral  Pain, Non-pain Symptom, Clarify Goals of Care  Date of Admission  04/25/16  Date first seen by Palliative Care  04/29/16  # of days Palliative referral response time  1 Day(s)  # of days IP prior to Palliative referral  3  Clinical Assessment  Palliative Performance Scale Score  30%  Pain Max last 24 hours  5  Pain Min Last 24 hours  4  Dyspnea Max Last 24 Hours  5  Dyspnea Min Last 24 hours  4  Nausea Max Last 24 Hours  4  Nausea Min Last 24 Hours  3  Anxiety Max Last 24 Hours  4  Anxiety Min Last 24 Hours  3  Psychosocial & Spiritual Assessment  Palliative Care Outcomes  Patient/Family meeting held?  Yes  Who was at the meeting?  patient, daughter Orlin Hilding, sister, friend and bedside RN.   Palliative Care Outcomes  Clarified goals of care      Patient Active Problem List   Diagnosis Date Noted  . Encounter for palliative care   . Goals of care, counseling/discussion   . DNR (do not resuscitate)   . Constipation 04/27/2016  . Right shoulder pain   . Bleeding gastrointestinal 04/25/2016  . Hypotension 04/25/2016  . Acute blood loss anemia 04/25/2016  . Cancer of bronchus of left upper lobe ([place]) 04/23/2016  . Metastatic lung cancer (metastasis from lung to other site) ([place]) 04/01/2016  . Brain metastases ([place]) 04/01/2016  . Bilateral renal masses 04/01/2016  . Falls 04/01/2016  . Confusion 04/01/2016  .  Leukocytosis 04/01/2016  . Cerebral brain hemorrhage ([place]) 04/01/2016  . Metastatic primary lung cancer ([place]) 04/01/2016  . Hypertension 06/23/2012  . Snoring 04/12/2012  . Chest pain 04/10/2012  . Obesity 04/10/2012  . Hyperlipidemia 03/02/2011  . Diabetes mellitus type II, non insulin dependent ([place]) 08/01/2010  . ASTHMA UNSPECIFIED WITH EXACERBATION 01/24/2010  . PALPITATIONS 01/24/2010  . ARRHYTHMIA, HX OF 01/24/2010  . HEMORRHOIDS 06/21/2009  . RECTAL BLEEDING 06/21/2009  . Paroxysmal supraventricular tachycardia ([place]) 07/27/2008  . Asthma, mild persistent 07/27/2008  . GERD 07/27/2008    Palliative Care Assessment & Plan   Patient Profile:    Assessment: Multifactorial anemia  lung cancer with brain mets, skeletal and muscle mets.  Generalized weakness Uncontrolled pain Cancer related anorexia and poor functional status  Recommendations/Plan:   now off PO Oxy IR, patient has TDF 25 micrograms and IV Morphine PRN ( 2 mg IV, 6 doses in past 24 hours)  Briefly discussed about residential hospice with family, they wish to discuss with Dr Earlie Server one final time whether or not she would qualify for additional treatments.   Discussed about cancer related cachexia and anorexia in detail.     Code Status:    Code Status Orders        Start     Ordered   04/29/16 1142  Do not attempt resuscitation (DNR)  Continuous    Question Answer Comment  In the event of cardiac or respiratory ARREST Do not  call a "code blue"   In the event of cardiac or respiratory ARREST Do not perform Intubation, CPR, defibrillation or ACLS   In the event of cardiac or respiratory ARREST Use medication by any route, position, wound care, and other measures to relive pain and suffering. May use oxygen, suction and manual treatment of airway obstruction as needed for comfort.      04/29/16 1153    Code Status History    Date Active Date Inactive Code Status Order ID Comments User Context    04/25/2016 10:55 PM 04/29/2016 11:53 AM Partial Code [id number]  [name], MD Inpatient   04/01/2016  7:41 PM 04/03/2016  1:01 PM Full Code [id number]  Vianne Bulls, MD ED       Prognosis:   < 4 weeks?  Discharge Planning:  To Be Determined  Care plan was discussed with  Patient, husband, 2 daughters, sister, pastor and his wife and several other family members present in the room.   Thank you for allowing the Palliative Medicine Team to assist in the care of this patient.   Time In: 10 Time Out: 1035 Total Time 35 Prolonged Time Billed  no       Greater than 50%  of this time was spent counseling and coordinating care related to the above assessment and plan.  Loistine Chance, MD [phone]  Please contact Palliative Medicine Team phone at [phone] for questions and concerns.

## 2016-04-30 NOTE — Progress Notes (Addendum)
Patient ID: [name], female   DOB: 1950/09/20, 65 y.o.   MRN: [id number]  PROGRESS NOTE    [name]  XLK:[id number] DOB: 25-Aug-1950 DOA: 04/25/2016  PCP: Robert Bellow, MD   Brief Narrative:  65 y.o.femalewith recently diagnosed lung cancer metastatic to the brain and kidneys receiving radiation therapy who presented to [facility] from the radiation oncology office after patient was found to be weak and hypotensive.  Patient was initially found to have leukocytosis and elevated lactic acid she was given fluids and started on antibiotics. Patient was also found to have right rotator cuff tear, blood loss anemia. She has received 3 units of blood transfusion so far. She has been seen by oncology and orthopedic surgery. She had a right shoulder injection with steroids for right rotator cuff tear and pain.  Assessment & Plan:   Acute GI bleed, anemia of chronic disease, thrombocytopenia  - Anemia likely secondary to history of malignancy - Status post 3 units of PRBC transfusion so far - Hemoglobin is 7.4 this morning so we will transfuse another unit of PRBC today  Sepsis of unclear etiology  - Please note that pt met criteria for sepsis on admission with WBC 20 K, HR up to 107, immunocompromised pt and with SBP in 80's - No clear source of infection, possibly urinary source  - Was started on broad spectrum abx vanc and maxipime - All antibiotics were stopped on 9/9  Lung mass with brain metastasis  - 3.7 cm mass noted in medial LUL on CT with invasion into mediastinum (recent CT in August 2017) - Suspected to be a primary bronchogenic carcinoma  - Pt follows with Dr. Lisbeth Renshaw for radiation therapy and has completed intracranial and thoracic radiation - Multiple brain hemorrhages also noted on recent MRI in August 2017, largest of which is 2.8 x 2.5 x 2.6 cm at the genu of Rt internal capsule with mass effect on the third ventricle and midline shift of 7 mm  - Continue  dexamethasone - Palliative care also following   Renal masses  - Bilateral renal masses noted on recent CT and also in august 2017, possibly metastatic  - Abdomen MRI with & without contrast in August 2017 was notable for enhancing intramuscular lesions most consistent with metastasis, presumably from the patient's primary lung cancer - Note she had previous 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  Type II DM, controlled without complications with current long term insulin use  - A1c was 7.2% in 2011  - Managed at home with insulin lantus and metformin - Currently on Lantus 20 units at bedtime and sliding scale insulin  Leukocytosis  - Due to steroids   Right shoulder pain  - S/P recent fall - MRI of the shoulder shows Full-thickness partial width anterior supraspinatus tear allowing fluid into the subacromial subdeltoid bursa - Orthopedics has seen and performed steroid injection; pain improving  - Continue fentanyl patch, morphine; stopped oxycodone   GERD - GI recommended PPI BID    DVT prophylaxis:SCD's bilaterally  Code Status:DNR/DNI Family Communication:Family updated at bedside (daughters); multiple family members in the room  Disposition Plan: Home with hosp versus residential   Consultants:   GI - phone call only   Oncology  Orthopedics  Palliative care   Procedures:   None  Antimicrobials:   Vanco/Cefepime discontinued 9/9    Subjective: No overnight events.   Objective: Vitals:   04/29/16 0457 04/29/16 1346 04/29/16 2116 04/30/16 0456  BP:  113/78 135/81 109/66 115/67  Pulse: (!) 108 (!) 117 (!) 118 (!) 104  Resp: 18 20 20 18   Temp: 99.9 F (37.7 C) 99.6 F (37.6 C) 99.1 F (37.3 C) 98.6 F (37 C)  TempSrc: Oral Oral Oral Oral  SpO2: 94% 92% 93% 94%  Weight:      Height:        Intake/Output Summary (Last 24 hours) at 04/30/16 1045 Last data filed at 04/30/16 0501  Gross per 24 hour  Intake                 0 ml  Output              450 ml  Net             -450 ml   Filed Weights   04/25/16 2316  Weight: 72.6 kg (160 lb 0.9 oz)    Examination:  General exam: Appears calm and comfortable  Respiratory system: Clear to auscultation. Respiratory effort normal. Cardiovascular system: S1 & S2 heard, RRR. No JVD Gastrointestinal system: Abdomen is nondistended, soft and nontender. No organomegaly or masses felt. Normal bowel sounds heard. Central nervous system: Alert and oriented. No focal neurological deficits. Extremities: Symmetric 5 x 5 power. (+1) LE pitting edema Skin: No rashes, lesions or ulcers Psychiatry: Judgement and insight appear normal. Mood & affect appropriate.   Data Reviewed: I have personally reviewed following labs and imaging studies  CBC:  Recent Labs Lab 04/25/16 1700  04/26/16 0710 04/27/16 0502 04/28/16 0514 04/29/16 0449 04/30/16 0446  WBC 20.1*  < > 15.2* 14.2* 17.3* 18.4* 18.4*  NEUTROABS 18.2*  --   --   --  15.9*  --   --   HGB 7.8*  < > 7.4* 6.7* 9.5* 8.1* 7.4*  HCT 24.4*  < > 22.7* 20.7* 27.9* 23.8* 22.7*  MCV 84.1  < > 85.0 87.0 85.1 85.3 87.6  PLT 161  < > 120* 105* 77* 54* 60*  < > = values in this interval not displayed. Basic Metabolic Panel:  Recent Labs Lab 04/25/16 1700 04/25/16 1708 04/26/16 0710 04/27/16 0502 04/28/16 1759 04/29/16 0449 04/30/16 0356  NA 134* 134* 137 133*  --  133* 135  K 3.9 3.9 3.8 3.8  --  4.2 4.5  CL 104 100* 109 104  --  104 104  CO2 21*  --  21* 20*  --  21* 21*  GLUCOSE 211* 209* 210* 257* 383* 340* 265*  BUN 28* 24* 21* 16  --  27* 35*  CREATININE 0.75 0.70 0.57 0.61  --  0.74 0.91  CALCIUM 8.9  --  8.0* 7.9*  --  8.3* 8.3*  MG 1.8  --   --   --   --   --   --    GFR: Estimated Creatinine Clearance: 59 mL/min (by C-G formula based on SCr of 0.91 mg/dL). Liver Function Tests:  Recent Labs Lab 04/25/16 1700  AST 14*  ALT 16  ALKPHOS 77  BILITOT 1.5*  PROT 6.1*  ALBUMIN 2.7*      Recent Labs Lab 04/25/16 1700  LIPASE 19   No results for input(s): AMMONIA in the last 168 hours. Coagulation Profile: No results for input(s): INR, PROTIME in the last 168 hours. Cardiac Enzymes: No results for input(s): CKTOTAL, CKMB, CKMBINDEX, TROPONINI in the last 168 hours. BNP (last 3 results) No results for input(s): PROBNP in the last 8760 hours. HbA1C: No results for input(s): HGBA1C  in the last 72 hours. CBG:  Recent Labs Lab 04/27/16 0004 04/27/16 0514 04/27/16 0744 04/27/16 1130 04/27/16 1534  GLUCAP 221* 266* 238* 300* 283*   Lipid Profile: No results for input(s): CHOL, HDL, LDLCALC, TRIG, CHOLHDL, LDLDIRECT in the last 72 hours. Thyroid Function Tests: No results for input(s): TSH, T4TOTAL, FREET4, T3FREE, THYROIDAB in the last 72 hours. Anemia Panel:  Recent Labs  04/28/16 0514  VITAMINB12 459  FERRITIN 1,162*  TIBC 199*  IRON 29  RETICCTPCT 4.1*   Urine analysis:    Component Value Date/Time   COLORURINE AMBER (A) 04/25/2016 1613   APPEARANCEUR CLOUDY (A) 04/25/2016 1613   LABSPEC 1.027 04/25/2016 1613   PHURINE 5.5 04/25/2016 1613   GLUCOSEU NEGATIVE 04/25/2016 1613   HGBUR NEGATIVE 04/25/2016 1613   BILIRUBINUR SMALL (A) 04/25/2016 1613   KETONESUR NEGATIVE 04/25/2016 1613   PROTEINUR NEGATIVE 04/25/2016 1613   UROBILINOGEN 0.2 01/21/2012 1145   NITRITE NEGATIVE 04/25/2016 1613   LEUKOCYTESUR SMALL (A) 04/25/2016 1613   Sepsis Labs: @LABRCNTIP (procalcitonin:4,lacticidven:4)    Recent Results (from the past 240 hour(s))  Urine culture     Status: None   Collection Time: 04/25/16  4:13 PM  Result Value Ref Range Status   Specimen Description URINE, RANDOM  Final   Special Requests NONE  Final   Culture NO GROWTH Performed at [facility]   Final   Report Status 04/27/2016 FINAL  Final  MRSA PCR Screening     Status: None   Collection Time: 04/26/16 12:07 AM  Result Value Ref Range Status   MRSA by PCR NEGATIVE  NEGATIVE Final      Radiology Studies: Mr Shoulder Right Wo Contrast Result Date: 04/26/2016 1. Full-thickness partial width anterior supraspinatus tear allowing fluid into the subacromial subdeltoid bursa. There is moderate supraspinatus tendinopathy also with some partial thickness articular surface tearing in the supraspinatus. Mild infraspinatus tendinopathy. 2. Scattered metastatic lesions are present in the muscular and skeletal structures of the right shoulder, including several lesions in the deltoid, a mass in the teres minor muscle, and a smaller mass in the infraspinatus muscle, all with surrounding muscular edema. Bony lesions occluded distal clavicular lesion and a proximal acromial lesion. 3. Nonspecific 1.4 by 1.3 cm lesion in the humeral head may be degenerative or less likely metastatic. Electronically Signed   By: Van Clines M.D.   On: 04/26/2016 22:25      Scheduled Meds: . sodium chloride   Intravenous Once  . dexamethasone  4 mg Intravenous Q12H  . fentaNYL  25 mcg Transdermal Q72H  . insulin aspart  0-15 Units Subcutaneous TID WC  . insulin glargine  20 Units Subcutaneous Q2200  . magic mouthwash  5 mL Oral QID  . mouth rinse  15 mL Mouth Rinse BID  . mometasone-formoterol  2 puff Inhalation BID  . pantoprazole  40 mg Intravenous Q12H  . polyethylene glycol  17 g Oral Daily  . PRUTECT  1 application Topical Daily  . tacrolimus  1 application Topical BID   Continuous Infusions:    LOS: 5 days    Time spent: 25 minutes  Greater than 50% of the time spent on counseling and coordinating the care.   Leisa Lenz, MD [facility] Pager [phone]  If 7PM-7AM, please contact night-coverage [email or web address] Password [email or web address] 04/30/2016, 10:45 AM

## 2016-04-30 NOTE — Progress Notes (Signed)
Nutrition Brief Note  Pt identified as at nutrition risk on the Malnutrition Screen Tool.  Chart reviewed. Pt now transitioning to comfort care.  No nutrition interventions warranted at this time.  Please consult as needed.   Clayton Bibles, MS, RD, LDN Pager: [phone] After Hours Pager: [phone]

## 2016-04-30 NOTE — Care Management Note (Signed)
Case Management Note  Patient Details  Name: [name] MRN: [id number] Date of Birth: 29-May-1951  Subjective/Objective:   65 y/o f admitted w/GIB, anemia, Lung Ca mets to brain. DNR. From SNF. PMT following-see notes.CSW/CM following for d/c plans.                Action/Plan:d/c SNF.   Expected Discharge Date:                  Expected Discharge Plan:  Skilled Nursing Facility  In-House Referral:  Clinical Social Work  Discharge planning Services  CM Consult  Post Acute Care Choice:    Choice offered to:     DME Arranged:    DME Agency:     HH Arranged:    [place] Agency:     Status of Service:  In process, will continue to follow  If discussed at Long Length of Stay Meetings, dates discussed:    Additional Comments:  Dessa Phi, RN 04/30/2016, 12:36 PM

## 2016-05-01 ENCOUNTER — Ambulatory Visit: Payer: PPO | Admitting: Internal Medicine

## 2016-05-01 ENCOUNTER — Telehealth: Payer: Self-pay | Admitting: *Deleted

## 2016-05-01 DIAGNOSIS — Z515 Encounter for palliative care: Secondary | ICD-10-CM

## 2016-05-01 LAB — CBC
HEMATOCRIT: 24.9 % — AB (ref 36.0–46.0)
HEMOGLOBIN: 8.4 g/dL — AB (ref 12.0–15.0)
MCH: 29.5 pg (ref 26.0–34.0)
MCHC: 33.7 g/dL (ref 30.0–36.0)
MCV: 87.4 fL (ref 78.0–100.0)
Platelets: 57 10*3/uL — ABNORMAL LOW (ref 150–400)
RBC: 2.85 MIL/uL — ABNORMAL LOW (ref 3.87–5.11)
RDW: 15.6 % — AB (ref 11.5–15.5)
WBC: 17 10*3/uL — AB (ref 4.0–10.5)

## 2016-05-01 LAB — BASIC METABOLIC PANEL
ANION GAP: 8 (ref 5–15)
BUN: 39 mg/dL — AB (ref 6–20)
CHLORIDE: 106 mmol/L (ref 101–111)
CO2: 22 mmol/L (ref 22–32)
Calcium: 8.4 mg/dL — ABNORMAL LOW (ref 8.9–10.3)
Creatinine, Ser: 0.77 mg/dL (ref 0.44–1.00)
GFR calc Af Amer: >60 mL/min (ref >60)
GFR calc non Af Amer: >60 mL/min (ref >60)
GLUCOSE: 243 mg/dL — AB (ref 65–99)
POTASSIUM: 4.2 mmol/L (ref 3.5–5.1)
SODIUM: 136 mmol/L (ref 135–145)

## 2016-05-01 LAB — GLUCOSE, CAPILLARY
GLUCOSE-CAPILLARY: 256 mg/dL — AB (ref 65–99)
Glucose-Capillary: 289 mg/dL — ABNORMAL HIGH (ref 65–99)
Glucose-Capillary: 251 mg/dL — ABNORMAL HIGH (ref 65–99)
Glucose-Capillary: 417 mg/dL — ABNORMAL HIGH (ref 65–99)

## 2016-05-01 LAB — TYPE AND SCREEN
ABO/RH(D): O POS
Antibody Screen: NEGATIVE
Unit division: 0

## 2016-05-01 MED ORDER — GUAIFENESIN ER 600 MG PO TB12
600.0000 mg | ORAL_TABLET | Freq: Two times a day (BID) | ORAL | Status: DC | PRN
Start: 2016-05-01 — End: 2016-05-02
  Administered 2016-05-01: 600 mg via ORAL
  Filled 2016-05-01: qty 1

## 2016-05-01 NOTE — Consult Note (Signed)
   [name] CM Inpatient Consult   05/01/2016  [name] 1951-05-28 [id number]    Patient screened for [name] services. Chart reviewed. Noted Palliative Medicine notes. [name] Care Management services not appropriate at this time.    Marthenia Rolling, MSN-Ed, RN,BSN [name] Liaison [phone]

## 2016-05-01 NOTE — Progress Notes (Signed)
Patient ID: [name], female   DOB: 1950-09-29, 65 y.o.   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  Patient was initially found to have leukocytosis and elevated lactic acid she was given fluids and started on antibiotics. Patient was also found to have right rotator cuff tear, blood loss anemia. She has received 3 units of blood transfusion so far. She has been seen by oncology and orthopedic surgery. She had a right shoulder injection with steroids for right rotator cuff tear and pain.  Assessment & Plan:   Acute GI bleed, anemia of chronic disease, thrombocytopenia  - Anemia likely secondary to history of malignancy - Status post 4 units of PRBC transfusion so far  Sepsis of unclear etiology  - Please note that pt met criteria for sepsis on admission with WBC 20 K, HR up to 107, immunocompromised pt and with SBP in 80's - Was started on broad spectrum abx vanc and maxipime - All antibiotics stopped on 9/9  Lung mass with brain metastasis  - 3.7 cm mass noted in medial LUL on CT with invasion into mediastinum (recent CT in August 2017) - Suspected to be a primary bronchogenic carcinoma  - Pt follows with Dr. Lisbeth Renshaw for radiation therapy and has completed intracranial and thoracic radiation - Multiple brain hemorrhages also noted on recent MRI in August 2017, largest of which is 2.8 x 2.5 x 2.6 cm at the genu of Rt internal capsule with mass effect on the third ventricle and midline shift of 7 mm  - Continue dexamethasone - Palliative care following   Renal masses  - Bilateral renal masses noted on recent CT and also in august 2017, possibly metastatic    - Abdomen MRI with & without contrast in August 2017 was notable for enhancing intramuscular lesions most consistent with metastasis, presumably from the patient's primary lung cancer - Note she had previous 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  Type II DM, controlled without complications with current long term insulin use  - A1c was 7.2% in 2011  - Continue current insulin regimen   Leukocytosis  - Due to steroids   Right shoulder pain  - S/P recent fall - MRI of the shoulder shows Full-thickness partial width anterior supraspinatus tear allowing fluid into the subacromial subdeltoid bursa - Orthopedics has seen and performed steroid injection; pain improving  - Continue fentanyl patch, morphine   GERD - GI recommended PPI BID    DVT prophylaxis:SCD's bilaterally  Code Status:DNR/DNI Family Communication:Family updated at bedside (daughters) Disposition Plan: residential hosp in am depending on bed availability   Consultants:   GI - phone call only   Oncology  Orthopedics  Palliative care   Procedures:   None  Antimicrobials:   Vanco/Cefepime discontinued 9/9    Subjective: No overnight events.   Objective: Vitals:   04/30/16 1320 04/30/16 1530 04/30/16 2054 05/01/16 0536  BP: 125/64 120/66 103/70 118/68  Pulse: 96 98 90 82  Resp: 16 18 16 18   Temp: 98 F (36.7 C) 98.4 F (36.9 C) 97.7 F (36.5 C) 97.8 F (36.6 C)  TempSrc: Oral Oral Axillary Axillary  SpO2: 95% 95% 95% 98%  Weight:      Height:        Intake/Output Summary (Last 24 hours) at 05/01/16 1258 Last data filed at 04/30/16 2339  Gross per 24 hour  Intake              415 ml  Output             1200 ml  Net             -785 ml   Filed Weights   04/25/16 2316  Weight: 72.6 kg (160 lb 0.9 oz)    Examination:  General exam: Appears calm and comfortable, no distress  Respiratory system: Respiratory effort normal. No wheezing   Cardiovascular system: S1 & S2 heard, Rate controlled  Gastrointestinal system: Abdomen is nondistended, (+) BS Central nervous system: No focal neurological deficits. Extremities: Symmetric 5 x 5 power. (+1) LE pitting edema Skin: warm and dry  Psychiatry: Mood & affect appropriate.   Data Reviewed: I have personally reviewed following labs and imaging studies  CBC:  Recent Labs Lab 04/25/16 1700  04/27/16 0502 04/28/16 0514 04/29/16 0449 04/30/16 0446 05/01/16 0523  WBC 20.1*  < > 14.2* 17.3* 18.4* 18.4* 17.0*  NEUTROABS 18.2*  --   --  15.9*  --   --   --   HGB 7.8*  < > 6.7* 9.5* 8.1* 7.4* 8.4*  HCT 24.4*  < > 20.7* 27.9* 23.8* 22.7* 24.9*  MCV 84.1  < > 87.0 85.1 85.3 87.6 87.4  PLT 161  < > 105* 77* 54* 60* 57*  < > = values in this interval not displayed. Basic Metabolic Panel:  Recent Labs Lab 04/25/16 1700  04/26/16 0710 04/27/16 0502 04/28/16 1759 04/29/16 0449 04/30/16 0356 05/01/16 0523  NA 134*  < > 137 133*  --  133* 135 136  K 3.9  < > 3.8 3.8  --  4.2 4.5 4.2  CL 104  < > 109 104  --  104 104 106  CO2 21*  --  21* 20*  --  21* 21* 22  GLUCOSE 211*  < > 210* 257* 383* 340* 265* 243*  BUN 28*  < > 21* 16  --  27* 35* 39*  CREATININE 0.75  < > 0.57 0.61  --  0.74 0.91 0.77  CALCIUM 8.9  --  8.0* 7.9*  --  8.3* 8.3* 8.4*  MG 1.8  --   --   --   --   --   --   --   < > = values in this interval not displayed. GFR: Estimated Creatinine Clearance: 67.2 mL/min (by C-G formula based on SCr of 0.8 mg/dL). Liver Function Tests:  Recent Labs Lab 04/25/16 1700  AST 14*  ALT 16  ALKPHOS 77  BILITOT 1.5*  PROT 6.1*  ALBUMIN 2.7*    Recent Labs Lab 04/25/16 1700  LIPASE 19   No results for input(s): AMMONIA in the last 168 hours. Coagulation Profile: No results for input(s): INR, PROTIME in the last 168 hours. Cardiac Enzymes: No results for input(s): CKTOTAL, CKMB, CKMBINDEX, TROPONINI in the last 168 hours. BNP (last 3 results) No results  for input(s): PROBNP in the last 8760 hours. HbA1C: No results for input(s): HGBA1C in the last 72 hours. CBG:  Recent Labs Lab 04/30/16 0748 04/30/16 1236 04/30/16 1717 04/30/16 2050 05/01/16 0741  GLUCAP 323* 368* 289* 251* 256*   Lipid Profile: No results for input(s): CHOL, HDL, LDLCALC, TRIG, CHOLHDL,  LDLDIRECT in the last 72 hours. Thyroid Function Tests: No results for input(s): TSH, T4TOTAL, FREET4, T3FREE, THYROIDAB in the last 72 hours. Anemia Panel: No results for input(s): VITAMINB12, FOLATE, FERRITIN, TIBC, IRON, RETICCTPCT in the last 72 hours. Urine analysis:    Component Value Date/Time   COLORURINE AMBER (A) 04/25/2016 1613   APPEARANCEUR CLOUDY (A) 04/25/2016 1613   LABSPEC 1.027 04/25/2016 1613   PHURINE 5.5 04/25/2016 1613   GLUCOSEU NEGATIVE 04/25/2016 1613   HGBUR NEGATIVE 04/25/2016 1613   BILIRUBINUR SMALL (A) 04/25/2016 1613   KETONESUR NEGATIVE 04/25/2016 1613   PROTEINUR NEGATIVE 04/25/2016 1613   UROBILINOGEN 0.2 01/21/2012 1145   NITRITE NEGATIVE 04/25/2016 1613   LEUKOCYTESUR SMALL (A) 04/25/2016 1613   Sepsis Labs: @LABRCNTIP (procalcitonin:4,lacticidven:4)    Recent Results (from the past 240 hour(s))  Urine culture     Status: None   Collection Time: 04/25/16  4:13 PM  Result Value Ref Range Status   Specimen Description URINE, RANDOM  Final   Special Requests NONE  Final   Culture NO GROWTH Performed at [facility]   Final   Report Status 04/27/2016 FINAL  Final  MRSA PCR Screening     Status: None   Collection Time: 04/26/16 12:07 AM  Result Value Ref Range Status   MRSA by PCR NEGATIVE NEGATIVE Final      Radiology Studies: Mr Shoulder Right Wo Contrast Result Date: 04/26/2016 1. Full-thickness partial width anterior supraspinatus tear allowing fluid into the subacromial subdeltoid bursa. There is moderate supraspinatus tendinopathy also with some partial thickness articular surface tearing in the supraspinatus.  Mild infraspinatus tendinopathy. 2. Scattered metastatic lesions are present in the muscular and skeletal structures of the right shoulder, including several lesions in the deltoid, a mass in the teres minor muscle, and a smaller mass in the infraspinatus muscle, all with surrounding muscular edema. Bony lesions occluded distal clavicular lesion and a proximal acromial lesion. 3. Nonspecific 1.4 by 1.3 cm lesion in the humeral head may be degenerative or less likely metastatic. Electronically Signed   By: Van Clines M.D.   On: 04/26/2016 22:25      Scheduled Meds: . sodium chloride   Intravenous Once  . sodium chloride   Intravenous Once  . sodium chloride   Intravenous Once  . dexamethasone  4 mg Intravenous Q12H  . fentaNYL  25 mcg Transdermal Q72H  . insulin aspart  0-15 Units Subcutaneous TID WC  . insulin glargine  20 Units Subcutaneous Q2200  . magic mouthwash  5 mL Oral QID  . mouth rinse  15 mL Mouth Rinse BID  . mometasone-formoterol  2 puff Inhalation BID  . pantoprazole  40 mg Intravenous Q12H  . polyethylene glycol  17 g Oral Daily  . PRUTECT  1 application Topical Daily   Continuous Infusions:    LOS: 6 days    Time spent: 15 minutes  Greater than 50% of the time spent on counseling and coordinating the care.   Leisa Lenz, MD [facility] Pager [phone]  If 7PM-7AM, please contact night-coverage [email or web address] Password [facility] 05/01/2016, 12:58 PM

## 2016-05-01 NOTE — Progress Notes (Signed)
OT Cancellation Note  Patient Details Name: [name] MRN: [id number] DOB: Oct 02, 1950   Cancelled Treatment:    Reason Eval/Treat Not Completed: Pain limiting ability to participate. Will check another time.  Albino Bufford 05/01/2016, 2:52 PM  Lesle Chris, OTR/L [phone] 05/01/2016

## 2016-05-01 NOTE — Telephone Encounter (Signed)
Call from pt daughter asking about appt with Dr. Julien Nordmann. Pt admitted prior to the scheduled office visit. Reviewed with MD. Pt is not an established pt at this time. Per MD, pt will be given New pt appt with MD after she is discharged.  Returned call to [place] with above information.

## 2016-05-01 NOTE — Progress Notes (Signed)
Inpatient Diabetes Program Recommendations  AACE/ADA: New Consensus Statement on Inpatient Glycemic Control (2015)  Target Ranges:  Prepandial:   less than 140 mg/dL      Peak postprandial:   less than 180 mg/dL (1-2 hours)      Critically ill patients:  140 - 180 mg/dL   Lab Results  Component Value Date   GLUCAP 256 (H) 05/01/2016   HGBA1C 7.2 08/25/2009   Results for JAYNE, PECKENPAUGH (MRN [id number]) as of 05/01/2016 10:55  Ref. Range 04/30/2016 07:48 04/30/2016 12:36 04/30/2016 17:17 04/30/2016 20:50 05/01/2016 07:41  Glucose-Capillary Latest Ref Range: 65 - 99 mg/dL 323 (H) 368 (H) 289 (H) 251 (H) 256 (H)   Review of Glycemic Control  If appropriate, please consider insulin adjustments below.  Inpatient Diabetes Program Recommendations:    Increase Lantus to 24 units QHS Increase Novolog to resistant tidwc and hs.  Will continue to follow. Thank you. Lorenda Peck, RD, LDN, CDE Inpatient Diabetes Coordinator [phone]

## 2016-05-01 NOTE — Progress Notes (Signed)
[facility] Liaison: ? Received a request from [place], [place] for family interest in [facility] Chart reviewed. Met with daugther, Barnetta Chapel to confirm interest and answer questions.  Unfortunately, there is no bed availability today.  Will follow up with Barnetta Chapel and CSW in AM re: bed availability.    Thank you for this referral. ? Freddi Starr RN, [facility] Liaison [phone]

## 2016-05-01 NOTE — Progress Notes (Signed)
Dr. Charlies Silvers aware of blood sugar 412. Patient is comfort care, 15 units will be given to patient.

## 2016-05-01 NOTE — Progress Notes (Signed)
CSW spoke with patient's daughter at bedside, Carlyon Shadow re: residential hospice - family is agreeable and prefers [facility]. CSW made referral to [facility], RN with [facility] - awaiting response re: bed availability/eligibility.   Raynaldo Opitz, [facility] Clinical Social Worker cell #: [phone]

## 2016-05-01 NOTE — Progress Notes (Signed)
Daily Progress Note   Patient Name: [name]       Date: 05/01/2016 DOB: 29-Nov-1950  Age: 65 y.o. MRN#: [id number] Attending Physician: Robbie Lis, MD Primary Care Physician: Robert Bellow, MD Admit Date: 04/25/2016  Reason for Consultation/Follow-up: Establishing goals of care Life limiting illness: widely metastatic lung cancer.   Subjective: Patient resting in bed and did not arouse during encounter Daughter at bedside See below   Length of Stay: 6  Current Medications: Scheduled Meds:  . sodium chloride   Intravenous Once  . sodium chloride   Intravenous Once  . sodium chloride   Intravenous Once  . dexamethasone  4 mg Intravenous Q12H  . fentaNYL  25 mcg Transdermal Q72H  . insulin aspart  0-15 Units Subcutaneous TID WC  . insulin glargine  20 Units Subcutaneous Q2200  . magic mouthwash  5 mL Oral QID  . mouth rinse  15 mL Mouth Rinse BID  . mometasone-formoterol  2 puff Inhalation BID  . pantoprazole  40 mg Intravenous Q12H  . polyethylene glycol  17 g Oral Daily  . PRUTECT  1 application Topical Daily    Continuous Infusions:    PRN Meds: acetaminophen **OR** acetaminophen, albuterol, guaiFENesin, LORazepam, morphine injection, ondansetron **OR** ondansetron (ZOFRAN) IV  Physical Exam         Weak frail appearing S1 S2 Diminished Edema 2-3+ on RUE Now has foley Abdomen soft Awakens easily, answers some questions.   Vital Signs: BP 128/66 (BP Location: Left Arm)   Pulse 95   Temp 97.7 F (36.5 C) (Axillary)   Resp 18   Ht 5' 2"  (1.575 m)   Wt 72.6 kg (160 lb 0.9 oz)   SpO2 94%   BMI 29.27 kg/m  SpO2: SpO2: 94 % O2 Device: O2 Device: Not Delivered O2 Flow Rate:    Intake/output summary:   Intake/Output Summary (Last 24 hours) at  05/01/16 2241 Last data filed at 05/01/16 1422  Gross per 24 hour  Intake              240 ml  Output             1300 ml  Net            -1060 ml   LBM: Last BM Date: 04/25/16 Baseline  Weight: Weight: 72.6 kg (160 lb 0.9 oz) Most recent weight: Weight: 72.6 kg (160 lb 0.9 oz)       Palliative Assessment/Data:    Flowsheet Rows   Flowsheet Row Most Recent Value  Intake Tab  Referral Department  Hospitalist  Unit at Time of Referral  Oncology Unit  Palliative Care Primary Diagnosis  Cancer  Date Notified  04/28/16  Palliative Care Type  New Palliative care  Reason for referral  Pain, Non-pain Symptom, Clarify Goals of Care  Date of Admission  04/25/16  Date first seen by Palliative Care  04/29/16  # of days Palliative referral response time  1 Day(s)  # of days IP prior to Palliative referral  3  Clinical Assessment  Palliative Performance Scale Score  30%  Pain Max last 24 hours  5  Pain Min Last 24 hours  4  Dyspnea Max Last 24 Hours  5  Dyspnea Min Last 24 hours  4  Nausea Max Last 24 Hours  4  Nausea Min Last 24 Hours  3  Anxiety Max Last 24 Hours  4  Anxiety Min Last 24 Hours  3  Psychosocial & Spiritual Assessment  Palliative Care Outcomes  Patient/Family meeting held?  Yes  Who was at the meeting?  patient, daughter Orlin Hilding, sister, friend and bedside RN.   Palliative Care Outcomes  Clarified goals of care      Patient Active Problem List   Diagnosis Date Noted  . Encounter for palliative care   . Goals of care, counseling/discussion   . DNR (do not resuscitate)   . Constipation 04/27/2016  . Right shoulder pain   . Bleeding gastrointestinal 04/25/2016  . Hypotension 04/25/2016  . Acute blood loss anemia 04/25/2016  . Cancer of bronchus of left upper lobe ([place]) 04/23/2016  . Metastatic lung cancer (metastasis from lung to other site) ([place]) 04/01/2016  . Brain metastases ([place]) 04/01/2016  . Bilateral renal masses 04/01/2016  . Falls  04/01/2016  . Confusion 04/01/2016  . Leukocytosis 04/01/2016  . Cerebral brain hemorrhage ([place]) 04/01/2016  . Metastatic primary lung cancer ([place]) 04/01/2016  . Hypertension 06/23/2012  . Snoring 04/12/2012  . Chest pain 04/10/2012  . Obesity 04/10/2012  . Hyperlipidemia 03/02/2011  . Diabetes mellitus type II, non insulin dependent ([place]) 08/01/2010  . ASTHMA UNSPECIFIED WITH EXACERBATION 01/24/2010  . PALPITATIONS 01/24/2010  . ARRHYTHMIA, HX OF 01/24/2010  . HEMORRHOIDS 06/21/2009  . RECTAL BLEEDING 06/21/2009  . Paroxysmal supraventricular tachycardia ([place]) 07/27/2008  . Asthma, mild persistent 07/27/2008  . GERD 07/27/2008    Palliative Care Assessment & Plan   Patient Profile:    Assessment: Multifactorial anemia  lung cancer with brain mets, skeletal and muscle mets.  Generalized weakness Uncontrolled pain Cancer related anorexia and poor functional status  Recommendations/Plan:  TDF 25 micrograms and IV Morphine PRN  Discussed about residential hospice with daughter.  Patient did not arouse during encounter to discuss.  Family met with hospice liaison and family hopeful for bed at [facility] tomorrow.  Discussed rapid course and emotional difficulty of this transition with the patient's daughter.  She has found comfort in speaking with Catalina Antigua from chaplain services.  Appreciate chaplain follow-up.   Code Status:    Code Status Orders        Start     Ordered   04/29/16 6553  Do not attempt resuscitation (DNR)  Continuous    Question Answer Comment  In the event of cardiac or respiratory  ARREST Do not call a "code blue"   In the event of cardiac or respiratory ARREST Do not perform Intubation, CPR, defibrillation or ACLS   In the event of cardiac or respiratory ARREST Use medication by any route, position, wound care, and other measures to relive pain and suffering. May use oxygen, suction and manual treatment of airway obstruction as needed for comfort.        04/29/16 1153    Code Status History    Date Active Date Inactive Code Status Order ID Comments User Context   04/25/2016 10:55 PM 04/29/2016 11:53 AM Partial Code [id number]  [name], MD Inpatient   04/01/2016  7:41 PM 04/03/2016  1:01 PM Full Code [id number]  Vianne Bulls, MD ED       Prognosis:   Days to weeks  Discharge Planning:  Hospice facility  Care plan was discussed with  Patient's daughter Thank you for allowing the Palliative Medicine Team to assist in the care of this patient.   Time In: 1530 Time Out: 1600 Total Time 30 Prolonged Time Billed  no       Greater than 50%  of this time was spent counseling and coordinating care related to the above assessment and plan.  Micheline Rough, MD   Please contact Palliative Medicine Team phone at [phone] for questions and concerns.

## 2016-05-02 DIAGNOSIS — G893 Neoplasm related pain (acute) (chronic): Secondary | ICD-10-CM

## 2016-05-02 LAB — BASIC METABOLIC PANEL
ANION GAP: 10 (ref 5–15)
BUN: 41 mg/dL — ABNORMAL HIGH (ref 6–20)
CALCIUM: 8.4 mg/dL — AB (ref 8.9–10.3)
CO2: 24 mmol/L (ref 22–32)
CREATININE: 0.75 mg/dL (ref 0.44–1.00)
Chloride: 107 mmol/L (ref 101–111)
Glucose, Bld: 157 mg/dL — ABNORMAL HIGH (ref 65–99)
Potassium: 4.2 mmol/L (ref 3.5–5.1)
SODIUM: 141 mmol/L (ref 135–145)

## 2016-05-02 LAB — GLUCOSE, CAPILLARY
GLUCOSE-CAPILLARY: 277 mg/dL — AB (ref 65–99)
GLUCOSE-CAPILLARY: 146 mg/dL — AB (ref 65–99)
GLUCOSE-CAPILLARY: 177 mg/dL — AB (ref 65–99)
Glucose-Capillary: 211 mg/dL — ABNORMAL HIGH (ref 65–99)

## 2016-05-02 LAB — CBC
HCT: 24.3 % — ABNORMAL LOW (ref 36.0–46.0)
HEMOGLOBIN: 7.9 g/dL — AB (ref 12.0–15.0)
MCH: 28.9 pg (ref 26.0–34.0)
MCHC: 32.5 g/dL (ref 30.0–36.0)
MCV: 89 fL (ref 78.0–100.0)
PLATELETS: 48 10*3/uL — AB (ref 150–400)
RBC: 2.73 MIL/uL — AB (ref 3.87–5.11)
RDW: 16 % — ABNORMAL HIGH (ref 11.5–15.5)
WBC: 17.8 10*3/uL — AB (ref 4.0–10.5)

## 2016-05-02 MED ORDER — ONDANSETRON HCL 4 MG PO TABS: 4.0000 mg | ORAL_TABLET | Freq: Four times a day (QID) | ORAL | 0 refills | Status: AC | PRN

## 2016-05-02 MED ORDER — LORAZEPAM 2 MG/ML IJ SOLN: 0.5000 mg | Freq: Three times a day (TID) | INTRAMUSCULAR | 0 refills | Status: AC | PRN

## 2016-05-02 MED ORDER — GUAIFENESIN ER 600 MG PO TB12: 600.0000 mg | ORAL_TABLET | Freq: Two times a day (BID) | ORAL | 0 refills | Status: AC | PRN

## 2016-05-02 MED ORDER — PRUTECT EX EMUL: 1.0000 "application " | Freq: Every day | CUTANEOUS | 0 refills | Status: AC

## 2016-05-02 MED ORDER — MORPHINE SULFATE (PF) 2 MG/ML IV SOLN
2.0000 mg | INTRAVENOUS | 0 refills | Status: AC | PRN
Start: 2016-05-02 — End: ?

## 2016-05-02 MED ORDER — INSULIN ASPART 100 UNIT/ML ~~LOC~~ SOLN: 0.0000 [IU] | Freq: Three times a day (TID) | SUBCUTANEOUS | 11 refills | Status: AC

## 2016-05-02 MED ORDER — INSULIN GLARGINE 100 UNIT/ML ~~LOC~~ SOLN: 20.0000 [IU] | Freq: Every day | SUBCUTANEOUS | 11 refills | Status: AC

## 2016-05-02 MED ORDER — DEXAMETHASONE 4 MG PO TABS: 4.0000 mg | ORAL_TABLET | Freq: Two times a day (BID) | ORAL | 0 refills | Status: AC

## 2016-05-02 MED ORDER — FENTANYL 25 MCG/HR TD PT72: 25.0000 ug | MEDICATED_PATCH | TRANSDERMAL | 0 refills | Status: AC

## 2016-05-02 MED ORDER — PANTOPRAZOLE SODIUM 40 MG PO TBEC: 40.0000 mg | DELAYED_RELEASE_TABLET | Freq: Two times a day (BID) | ORAL | 0 refills | Status: AC

## 2016-05-02 NOTE — Progress Notes (Signed)
Patient is set to discharge to [facility] today. Patient & daughters at bedside made aware. Discharge packet given to RN, Susie. [facility] called for transport.     Raynaldo Opitz, [facility] Clinical Social Worker cell #: [phone]

## 2016-05-02 NOTE — Progress Notes (Signed)
PT Cancellation Note  Patient Details Name: [name] MRN: [id number] DOB: 1951/06/05   Cancelled Treatment:    Reason Eval/Treat Not Completed: PT screened, no needs identified, will sign off. Plan is for [facility]. Will sign off.    Weston Anna, MPT Pager: [phone]

## 2016-05-02 NOTE — Care Management Important Message (Signed)
Important Message  Patient Details  Name: [name] MRN: [id number] Date of Birth: Feb 24, 1951   Medicare Important Message Given:  Yes    Camillo Flaming 05/02/2016, 9:56 AMImportant Message  Patient Details  Name: [name] MRN: [id number] Date of Birth: 05-Jun-1951   Medicare Important Message Given:  Yes    Camillo Flaming 05/02/2016, 9:56 AM

## 2016-05-02 NOTE — Discharge Summary (Addendum)
Physician Discharge Summary  [name] SFK:[id number] DOB: [birth year] DOA: 04/25/2016  PCP: Robert Bellow, MD  Admit date: 04/25/2016 Discharge date: 05/02/2016  Recommendations for Outpatient Follow-up:  1. Continue fentanyl patch and morphine as needed for pain control on discharge.  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  Patient was initially found to have leukocytosis and elevated lactic acid she was given fluids and started on antibiotics. Patient was also found to have right rotator cuff tear, blood loss anemia. She has received 3 units of blood transfusion so far. She has been seen by oncology and orthopedic surgery. She had a right shoulder injection with steroids for right rotator cuff tear and pain.  Hospital Course:    Assessment & Plan:  Acute GI bleed, anemia of chronic disease, thrombocytopenia  - Anemia likely secondary to history of malignancy - Status post 4 units of PRBC transfusion so far  Sepsis of unclear etiology  - Please note that pt met criteria for sepsis on admission with WBC 20 K, HR up to 107, immunocompromised pt and with SBP in 80's - Was started on broad spectrum abx vanc and maxipime - All antibiotics stopped on 9/9  Lung mass with brain metastasis  - 3.7 cm mass noted in medial LUL on CT with  invasion into mediastinum (recent CT in August 2017) - Suspected to be a primary bronchogenic carcinoma  - Pt follows with Dr. Lisbeth Renshaw for radiation therapy and has completed intracranial and thoracic radiation - Multiple brain hemorrhages also noted on recent MRI in August 2017, largest of which is 2.8 x 2.5 x 2.6 cm at the genu of Rt internal capsule with mass effect on the third ventricle and midline shift of 7 mm  - Continue dexamethasone - Palliative care following   Renal masses  - Bilateral renal masses noted on recent CT and also in august 2017, possibly metastatic  - Abdomen MRI with & without contrast in August 2017 was notable for enhancing intramuscular lesions most consistent with metastasis, presumably from the patient's primary lung cancer - Note she had previous 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  Type II DM, controlled without complications with current long term insulin use  - A1c was 7.2% in 2011  - Continue current insulin regimen: Glargine 20 units at bedtime and sliding scale insulin   Leukocytosis  - Due to steroids   Right shoulder pain  - S/P recent fall - MRI of the shoulder shows Full-thickness partial width anterior supraspinatus tear allowing fluid into the subacromial subdeltoid bursa - Orthopedics has seen and performed steroid injection; pain improved - Continue fentanyl patch, morphine   GERD - GI recommended PPI BID    DVT prophylaxis:SCD's bilaterally  Code Status:DNR/DNI Family Communication:Family updated at bedside (daughters)   Consultants:  GI - phone call only   Oncology  Orthopedics  Palliative care   Procedures:  None  Antimicrobials:   Vanco/Cefepime discontinued 9/9    Signed:  Leisa Lenz, MD  [facility] 05/02/2016, 11:28 AM  Pager #: [phone]  Time spent in minutes: more than 30 minutes   Discharge Exam: Vitals:   05/01/16 2117 05/02/16 0458   BP: 128/66 117/64  Pulse: 95 97  Resp: 18 18  Temp: 97.7 F (36.5 C) 97.8 F (36.6 C)   Vitals:   05/01/16 0536 05/01/16 1421 05/01/16 2117 05/02/16 0458  BP: 118/68 121/69 128/66 117/64  Pulse: 82 96 95 97  Resp: 18 19 18 18   Temp: 97.8 F (36.6 C) 98.5 F (36.9 C) 97.7 F (36.5 C) 97.8 F (36.6 C)  TempSrc: Axillary Oral Axillary Oral  SpO2: 98% 94% 94% 100%  Weight:      Height:        General: Pt is alert, follows commands appropriately, not in acute distress Cardiovascular: Regular rate and rhythm, S1/S2 +, no murmurs Respiratory: Clear to auscultation bilaterally, no wheezing, no crackles, no rhonchi Abdominal: Soft, non tender, non distended, bowel sounds +, no guarding Extremities: no cyanosis, pulses palpable bilaterally DP and PT Neuro: Grossly nonfocal  Discharge Instructions  Discharge Instructions    Call MD for:  difficulty breathing, headache or visual disturbances    Complete by:  As directed    Call MD for:  persistant nausea and vomiting    Complete by:  As directed    Call MD for:  redness, tenderness, or signs of infection (pain, swelling, redness, odor or green/yellow discharge around incision site)    Complete by:  As directed    Call MD for:  severe uncontrolled pain    Complete by:  As directed    Diet - low sodium heart healthy    Complete by:  As directed    Increase activity slowly    Complete by:  As directed        Medication List    STOP taking these medications   B-Complex Tabs   Biotin 5000 MCG Caps   Calcium-Magnesium-Vitamin D 200-100-33.3 MG-MG-UNIT Caps   Fish Oil 1000 MG Caps   glipiZIDE 10 MG tablet Commonly known as:  GLUCOTROL   HYDROcodone-acetaminophen 5-325 MG tablet Commonly known as:  NORCO/VICODIN   Insulin Glargine 100 UNIT/ML Solostar Pen Commonly known as:  LANTUS SOLOSTAR Replaced by:  insulin glargine 100 UNIT/ML injection   LORazepam 0.5 MG tablet Commonly known as:  ATIVAN Replaced by:   LORazepam 2 MG/ML injection   metFORMIN 850 MG tablet Commonly known as:  GLUCOPHAGE   metoprolol succinate 25 MG 24 hr tablet Commonly known as:  TOPROL XL   omeprazole 20 MG capsule Commonly known as:  PRILOSEC   simvastatin 20 MG tablet Commonly known as:  ZOCOR   tacrolimus 0.1 % ointment Commonly known as:  PROTOPIC     TAKE these medications   acetaminophen 500 MG tablet Commonly known as:  TYLENOL Take 500 mg by mouth every 6 (six) hours as needed for mild pain.   budesonide-formoterol 160-4.5 MCG/ACT inhaler Commonly known as:  SYMBICORT Inhale 2 puffs into the lungs 2 (two) times daily.   clobetasol cream 0.05 % Commonly known as:  TEMOVATE Apply 1 application topically 2 (two) times daily.   dexamethasone 4 MG tablet Commonly known as:  DECADRON Take 1 tablet (4 mg total) by mouth 2 (two) times daily.   fentaNYL 25 MCG/HR patch Commonly known as:  [place] - dosed mcg/hr Place 1 patch (25 mcg total) onto the skin every 3 (three) days. Start taking on:  04/22/2016   guaiFENesin 600 MG 12 hr tablet Commonly known as:  MUCINEX Take 1 tablet (600 mg total) by mouth 2 (two) times daily as needed for cough or to loosen phlegm.   insulin aspart 100 UNIT/ML injection Commonly known as:  novoLOG Inject 0-15 Units into the skin 3 (three) times daily with meals. What changed:  how much to take   insulin glargine 100 UNIT/ML injection Commonly known as:  LANTUS Inject 0.2 mLs (20 Units total) into the skin daily at 10 pm. Replaces:  Insulin Glargine 100 UNIT/ML Solostar Pen   LORazepam 2 MG/ML injection Commonly known as:  ATIVAN Inject 0.25 mLs (0.5 mg total) into the vein every 8 (eight) hours as needed for anxiety. Replaces:  LORazepam 0.5 MG tablet   morphine 2 MG/ML injection Inject 1 mL (2 mg total) into the vein every 3 (three) hours as needed.   multivitamin with minerals Tabs tablet Take 1 tablet by mouth daily.   ondansetron 4 MG  tablet Commonly known as:  ZOFRAN Take 1 tablet (4 mg total) by mouth every 6 (six) hours as needed for nausea.   pantoprazole 40 MG tablet Commonly known as:  PROTONIX Take 1 tablet (40 mg total) by mouth 2 (two) times daily.   PROAIR HFA 108 (90 Base) MCG/ACT inhaler Generic drug:  albuterol Inhale 1 puff into the lungs every 6 (six) hours as needed for wheezing or shortness of breath.   prochlorperazine 10 MG tablet Commonly known as:  COMPAZINE Take 1 tablet (10 mg total) by mouth every 6 (six) hours as needed for nausea or vomiting.   PRUTECT Emul Apply 1 application topically daily. Start taking on:  04/22/2016 What changed:  additional instructions   THERA TEARS NUTRITION PO Take 1 drop by mouth daily.       Follow-up Information    Robert Bellow, MD .   Specialty:  Family Medicine Contact information: [place] [address] [phone]            The results of significant diagnostics from this hospitalization (including imaging, microbiology, ancillary and laboratory) are listed below for reference.    Significant Diagnostic Studies: Dg Chest 2 View  Result Date: 04/25/2016 CLINICAL DATA:  Right anterior shoulder pain after a fall 1 week ago. Initial encounter. EXAM: CHEST  2 VIEW COMPARISON:  04/05/2016. FINDINGS: Trachea is midline. Heart size normal. Lungs are clear. No pleural fluid. IMPRESSION: No acute findings. Electronically Signed   By: Lorin Picket M.D.   On: 04/25/2016 17:59   Dg Shoulder Right  Result Date: 04/25/2016 CLINICAL DATA:  Right anterior shoulder pain after a fall 1 week ago, initial encounter. EXAM: RIGHT SHOULDER - 2+ VIEW COMPARISON:  None. FINDINGS: No fracture or dislocation. Degenerative changes are seen in the right acromioclavicular joint. Visualized portion of the right chest is unremarkable. IMPRESSION: 1. No acute findings. 2. Right acromioclavicular joint osteoarthritis. Electronically Signed   By:  Lorin Picket M.D.   On: 04/25/2016 18:00   Ct Head Wo Contrast  Result Date: 04/25/2016 CLINICAL DATA:  Pt from cancer center, lives at [facility]. RN from cancer center reports pt has failure to thrive , weakness and loe BP reading 85/73 . Pt receives radiation for brain tumor. Per RN pt appears very different from last time was seen. Alert and oriented x  4. RN also reports pt had fall recently, presents with right shoulder "knot ". EXAM: CT HEAD WITHOUT CONTRAST TECHNIQUE: Contiguous axial images were obtained from the base of the skull through the vertex without intravenous contrast. COMPARISON:  04/03/2016 FINDINGS: Brain: Of the previously identified supratentorial and infratentorial metastatic lesions in the brain, 9 of the lesions are visible today. Most of these are hyperdense the and some, including the dominant right basal ganglia lesion measuring 2.8 by 2.7 cm on image 11/2, and the right cerebellar vermis lesion measuring 0.9 by 0.7 cm on image 8/2, probably have hemorrhagic component. Many of these lesions did have low gradient signal on the recent MRI, favoring a hemorrhagic component. There is some mild dependent density in the occipital horn of the right lateral ventricle and questionably of the left, potentially from intraventricular hemorrhage as shown on prior MRI. The dominant lesion in the right basal ganglia causes 6 mm of right-to-left midline shift, similar to prior. I do not see any definite new lesions compared to the MRI, and not all the lesions visible on MRI are well seen on today's noncontrast CT. No new hemorrhage is identified. Vascular: There is atherosclerotic calcification of the cavernous carotid arteries bilaterally. Skull: Unremarkable Sinuses/Orbits: Unremarkable Other: No supplemental non-categorized findings. IMPRESSION: 1. Essentially stable distribution and appearance of hemorrhagic metastatic disease to the brain. The dominant lesion measures 2.8 cm and  is in the right basal ganglia, causing 6 mm of focal right to left midline shift as it did previously. Trace amount of intraventricular hemorrhage. Nine of the previously identified metastatic lesions are visible today. I do not see any new lesions or new hemorrhage. Electronically Signed   By: Van Clines M.D.   On: 04/25/2016 20:16   Mr Jeri Cos XQ Contrast  Result Date: 04/04/2016 CLINICAL DATA:  65 y/o F; lung cancer with brain metastasis, etc starting patient. Increasing confusion. EXAM: MRI HEAD WITHOUT AND WITH CONTRAST TECHNIQUE: Multiplanar, multiecho pulse sequences of the brain and surrounding structures were obtained without and with intravenous contrast. CONTRAST:  58m MULTIHANCE GADOBENATE DIMEGLUMINE 529 MG/ML IV SOLN COMPARISON:  CT of the head dated 04/01/2016. FINDINGS: Brain: There are multiple foci (12-15) of susceptibility hypointensity compatible with hemorrhage throughout the supratentorial and infratentorial brain, the left thalamus, and within the right basal ganglia. The hyperdense lesions on the prior CT of the head have corresponding susceptibility foci and are consistent with acute hemorrhage. There are additional foci of susceptibility not appreciable on CT which may represent pole hemorrhage for example in the left thalamus and right lateral cerebellar hemisphere. The postcontrast images are severely motion degraded with a suboptimal assessment for enhancement. The larger lesions demonstrate T2 FLAIR hyperintensity likely representing vasogenic edema. The largest lesion in the right basal ganglia measures approximately 30 x 32 mm axially (series 8, image 14) and exerts mass effect with partial effacement of the third ventricle and right lateral ventricle and a 6 mm of right-to-left midline shift of septum pellucidum. Extra-axial space: Small diameter hemorrhage pooling within the occipital horns of the lateral ventricles. No hydrocephalus. No additional extra-axial  collection is identified. Other: No abnormal signal of the paranasal sinuses. No abnormal signal of the mastoid air cells. Orbits are unremarkable. Calvarium is unremarkable. IMPRESSION: 1. Multiple foci (12-15) of susceptibility hypointensity throughout the supra and infratentorial brain as well as the basal ganglia consistent with hemorrhage. Postcontrast images are severely motion degraded with a suboptimal assessment for enhancement. Some foci of susceptibility do not have a  CT correlate and may represent older hemorrhage. Findings probably represent hemorrhagic metastatic disease. Some foci may be related to hypertension. Consider repeat postcontrast images with sedation when patient is able. 2. Vasogenic edema and mass effect associated with the largest right basal ganglia lesion partially effaces the third and right lateral ventricle and results in stable 6 mm of right to left midline shift. Stable small volume of intraventricular hemorrhage. Electronically Signed   By: Kristine Garbe M.D.   On: 04/04/2016 01:13   Mr Shoulder Right Wo Contrast  Result Date: 04/26/2016 CLINICAL DATA:  Metastatic lung cancer.  Right shoulder pain. EXAM: MRI OF THE RIGHT SHOULDER WITHOUT CONTRAST TECHNIQUE: Multiplanar, multisequence MR imaging of the shoulder was performed. No intravenous contrast was administered. COMPARISON:  None. FINDINGS: Rotator cuff: Small full-thickness partial width anterior supraspinatus tear, image 13/ 4. Moderate supraspinatus tendinopathy with some partial thickness articular surface tearing and mild tendon delamination as on images 11 through 12 series 4. This causes thinning of the supraspinatus tendon. There is mild infraspinatus tendinopathy. Muscles: 3 masses are present in the lateral deltoid muscle, the largest 3.8 by 1.9 by 2.0 cm, with surrounding muscular edema compatible with muscular metastatic lesions. 1.9 by 2.2 by 4.7 cm mass in the teres minor muscle. 0.8 by 0.8 by 2.2  cm mass in the infraspinatus muscle. Edema tracks within along the distal trapezius muscle as it extends towards the distal clavicle. Biceps long head:  Unremarkable Acromioclavicular Joint: Abnormal edema in the distal clavicle suspicious for a metastatic lesion, with some cortical ill definition and surrounding soft tissue edema. There is a second bony lesion in the acromion posteriorly suspicious for metastatic lesion. Abnormal fluid in the subacromial subdeltoid bursa. Type II acromion. Glenohumeral Joint: Moderate degenerative chondral thinning. No joint effusion. Labrum:  Grossly unremarkable Bones: 1.4 by 1.3 cm speckled lesion with high T2 and low T1 signal characteristics in the humeral head adjacent to the greater tuberosity, probably degenerative, less likely metastatic. A chondroid lesion could also have this appearance. Other: No supplemental non-categorized findings. IMPRESSION: 1. Full-thickness partial width anterior supraspinatus tear allowing fluid into the subacromial subdeltoid bursa. There is moderate supraspinatus tendinopathy also with some partial thickness articular surface tearing in the supraspinatus. Mild infraspinatus tendinopathy. 2. Scattered metastatic lesions are present in the muscular and skeletal structures of the right shoulder, including several lesions in the deltoid, a mass in the teres minor muscle, and a smaller mass in the infraspinatus muscle, all with surrounding muscular edema. Bony lesions occluded distal clavicular lesion and a proximal acromial lesion. 3. Nonspecific 1.4 by 1.3 cm lesion in the humeral head may be degenerative or less likely metastatic. Electronically Signed   By: Van Clines M.D.   On: 04/26/2016 22:25   Ct Biopsy  Result Date: 04/03/2016 INDICATION: 65 year old female with a history of metastatic carcinoma. MR demonstrates lesion of the left psoas muscle. She has been referred for biopsy. EXAM: CT BIOPSY MEDICATIONS: None.  ANESTHESIA/SEDATION: Moderate (conscious) sedation was employed during this procedure. A total of Versed 2.0 mg and Fentanyl 50 mcg was administered intravenously. Moderate Sedation Time: 12 minutes. The patient's level of consciousness and vital signs were monitored continuously by radiology nursing throughout the procedure under my direct supervision. FLUOROSCOPY TIME:  CT COMPLICATIONS: None PROCEDURE: Informed written consent was obtained from the patient after a thorough discussion of the procedural risks, benefits and alternatives. All questions were addressed. Maximal Sterile Barrier Technique was utilized including caps, mask, sterile gowns, sterile gloves, sterile drape,  hand hygiene and skin antiseptic. A timeout was performed prior to the initiation of the procedure. Patient position prone position on CT gantry table. Scout CT was performed of the psoas musculature. The patient is then prepped and draped in the usual sterile fashion. The skin and subcutaneous tissues were generously infiltrated with 1% lidocaine for local anesthesia. A small stab incision was made with an 11 blade scalpel. Using CT guidance, 17 gauge guide needle was advanced into the targeted contour abnormality of the left psoas muscle. Multiple 18 gauge core biopsy were achieved. Patient tolerated the procedure well and remained hemodynamically stable throughout. No complications were encountered and no significant blood loss encountered. IMPRESSION: Status post CT-guided biopsy of left psoas lesion, targeting contour abnormality of the psoas muscle. Tissue specimen sent to pathology for complete histopathologic analysis. Signed, Dulcy Fanny. Earleen Newport, DO [facility] Rad[facility] Electronically Signed   By: Corrie Mckusick D.O.   On: 04/03/2016 17:02   Dg Chest Port 1 View  Result Date: 04/05/2016 CLINICAL DATA:  Fever, type II diabetes mellitus, hypertension, intermittent confusion and  multiple falls, CNS metastases suspected with LEFT upper lobe mass identified by CT EXAM: PORTABLE CHEST 1 VIEW COMPARISON:  Portable exam 2011 hours compared to 04/01/2016 FINDINGS: Normal heart size, mediastinal contours, and pulmonary vascularity normal. Medial LEFT upper lobe mass identified on prior radiographic and CT exams less well delineated on current study. Minimal LEFT basilar atelectasis. Lungs otherwise clear. No definite pleural effusion or pneumothorax. Bones demineralized. IMPRESSION: LEFT basilar atelectasis. LEFT upper lobe mass poorly visualized on current study. Electronically Signed   By: Lavonia Dana M.D.   On: 04/05/2016 20:48    Microbiology: Recent Results (from the past 240 hour(s))  Urine culture     Status: None   Collection Time: 04/25/16  4:13 PM  Result Value Ref Range Status   Specimen Description URINE, RANDOM  Final   Special Requests NONE  Final   Culture NO GROWTH Performed at [facility]   Final   Report Status 04/27/2016 FINAL  Final  MRSA PCR Screening     Status: None   Collection Time: 04/26/16 12:07 AM  Result Value Ref Range Status   MRSA by PCR NEGATIVE NEGATIVE Final    Comment:        The GeneXpert MRSA Assay (FDA approved for NASAL specimens only), is one component of a comprehensive MRSA colonization surveillance program. It is not intended to diagnose MRSA infection nor to guide or monitor treatment for MRSA infections.      Labs: Basic Metabolic Panel:  Recent Labs Lab 04/25/16 1700  04/27/16 0502 04/28/16 1759 04/29/16 0449 04/30/16 0356 05/01/16 0523 05/02/16 0517  NA 134*  < > 133*  --  133* 135 136 141  K 3.9  < > 3.8  --  4.2 4.5 4.2 4.2  CL 104  < > 104  --  104 104 106 107  CO2 21*  < > 20*  --  21* 21* 22 24  GLUCOSE 211*  < > 257* 383* 340* 265* 243* 157*  BUN 28*  < > 16  --  27* 35* 39* 41*  CREATININE 0.75  < > 0.61  --  0.74 0.91 0.77 0.75  CALCIUM 8.9  < > 7.9*  --  8.3* 8.3* 8.4* 8.4*  MG 1.8   --   --   --   --   --   --   --   < > =  values in this interval not displayed. Liver Function Tests:  Recent Labs Lab 04/25/16 1700  AST 14*  ALT 16  ALKPHOS 77  BILITOT 1.5*  PROT 6.1*  ALBUMIN 2.7*    Recent Labs Lab 04/25/16 1700  LIPASE 19   No results for input(s): AMMONIA in the last 168 hours. CBC:  Recent Labs Lab 04/25/16 1700  04/28/16 0514 04/29/16 0449 04/30/16 0446 05/01/16 0523 05/02/16 0517  WBC 20.1*  < > 17.3* 18.4* 18.4* 17.0* 17.8*  NEUTROABS 18.2*  --  15.9*  --   --   --   --   HGB 7.8*  < > 9.5* 8.1* 7.4* 8.4* 7.9*  HCT 24.4*  < > 27.9* 23.8* 22.7* 24.9* 24.3*  MCV 84.1  < > 85.1 85.3 87.6 87.4 89.0  PLT 161  < > 77* 54* 60* 57* 48*  < > = values in this interval not displayed. Cardiac Enzymes: No results for input(s): CKTOTAL, CKMB, CKMBINDEX, TROPONINI in the last 168 hours. BNP: BNP (last 3 results) No results for input(s): BNP in the last 8760 hours.  ProBNP (last 3 results) No results for input(s): PROBNP in the last 8760 hours.  CBG:  Recent Labs Lab 05/01/16 0741 05/01/16 1120 05/01/16 1633 05/01/16 2110 05/02/16 0738  GLUCAP 256* 417* 277* 146* 177*

## 2016-05-02 NOTE — Discharge Instructions (Signed)
fentanyl skin patch What is this medicine? FENTANYL (FEN ta nil) is a pain reliever. It is used to treat persistent, moderate to severe chronic pain. It is used only by people who have been taking an opioid or narcotic pain medicine for more than one week. This medicine may be used for other purposes; ask your health care provider or pharmacist if you have questions. What should I tell my health care provider before I take this medicine? They need to know if you have any of these conditions: -brain tumor -Crohn's disease, inflammatory bowel disease, or ulcerative colitis -drug abuse or addiction -head injury -heart disease -if you frequently drink alcohol containing drinks -kidney disease or problems going to the bathroom -liver disease -lung disease, asthma, or breathing problems -mental problems -skin problems -taken isocarboxazid, phenelzine, tranylcypromine, or selegiline in the past 2 weeks -an allergic or unusual reaction to fentanyl, meperidine, other medicines, foods, dyes, or preservatives -pregnant or trying to get pregnant -breast-feeding How should I use this medicine? Apply the patch to your skin. Do not cut or damage the patch. A cut or damaged patch can be very dangerous because you may get too much medicine. Select a clean, dry area of skin above your waist on your front or back. The upper back is a good spot to put the patch on children or people who are confused because it will be hard for them to remove the patch. Do not apply the patch to oily, broken, burned, cut, or irritated skin. Use only water to clean the area. Do not use soap or alcohol to clean the skin because this can increase the effects of the medicine. If the area is hairy, clip the hair with scissors, but do not shave. Take the patch out of its wrapper, and take off the protective strip over the sticky part. Do not use a patch if the packaging or backing is damaged. Do not touch the sticky part with your  fingers. Press the sticky surface to the skin using the palm of your hand. Press the patch to the skin for 30 seconds. Wash your hands at once with soap and water. Keep patches far away from children. Do not let children see you apply the patch and do not apply it where children can see it. Do not call the patch a sticker, tattoo, or bandage, as this could encourage the child to mimic your actions. Used patches still contain medicine. Children or pets can have serious side effects or die from putting used patches in their mouth or on their bodies. Take off the old patch before putting on a new patch. Apply each new patch to a different area of skin. If a patch comes off or causes irritation, remove it and apply a new patch to different site. If the edges of the patch start to loosen, first apply first aid tape to the edges of the patch. If problems with the patch not sticking continue, cover the patch with a see-through adhesive dressing (like Bioclusive or Tegaderm). Never cover the patch with any other bandage or tape. To get rid of used patches, fold the patch in half with the sticky sides together. Then, flush it down the toilet. Do not discard the patch in the garbage. Pets and children can be harmed if they find used or lost patches. Replace the patch every 3 days or as directed by your doctor or health care professional. Follow the directions on the prescription label. Do not take more medicine  than you are told to take. A special MedGuide will be given to you by the pharmacist with each prescription and refill. Be sure to read this information carefully each time. Talk to your pediatrician regarding the use of this medicine in children. While this drug may be prescribed for children as young as 89 years old for selected conditions, precautions do apply. If someone accidentally uses a fentanyl patch and is not awake and alert, immediately call 911 for help. If the person is awake and alert, call a  doctor, health care professional, or the [employer]. Overdosage: If you think you have taken too much of this medicine contact a poison control center or emergency room at once. NOTE: This medicine is only for you. Do not share this medicine with others. What if I miss a dose? If you forget to replace your patch, take off the old patch and put on a new patch as soon as you can. Do not apply an extra patch to your skin. Do not wear more than one patch at the same time unless told to do so by your doctor or health care professional. What may interact with this medicine? -alcohol -antihistamines -clarithromycin -diltiazem -erythromycin -grapefruit juice -herbal products that contain St. John's wort -itraconazole -ketoconazole -medicines for depression, anxiety, or psychotic disturbances -medicines for sleep -muscle relaxants -naltrexone -narcotic medicines (opiates) for pain -nelfinavir -nicardipine -phenobarbital, phenytoin, or fosphenytoin -rifampin -ritonavir -troleandomycin -verapamil This list may not describe all possible interactions. Give your health care provider a list of all the medicines, herbs, non-prescription drugs, or dietary supplements you use. Also tell them if you smoke, drink alcohol, or use illegal drugs. Some items may interact with your medicine. What should I watch for while using this medicine? Other pain medicine may be needed the first day you use the patch because the patch can take some time to start working. Tell your doctor or health care professional if your pain does not go away, if it gets worse, or if you have new or a different type of pain. You may develop tolerance to the medicine. Tolerance means that you will need a higher dose of the medicine for pain relief. Tolerance is normal and is expected if you take the medicine for a long time. Have a family member watch you for side effects during the first day you wear a patch or if your dose  changes. Do not suddenly stop taking your medicine because you may develop a severe reaction. Your body becomes used to the medicine. This does NOT mean you are addicted. Addiction is a behavior related to getting and using a drug for a non-medical reason. If you have pain, you have a medical reason to take pain medicine. Your doctor will tell you how much medicine to take. If your doctor wants you to stop the medicine, the dose will be slowly lowered over time to avoid any side effects. You may get drowsy or dizzy when you first start taking the medicine or change doses. Do not drive, use machinery, or do anything that may be dangerous until you know how the medicine affects you. Stand or sit up slowly. There are different types of narcotic medicines (opiates) for pain. If you take more than one type at the same time, you may have more side effects. Give your health care provider a list of all medicines you use. Your doctor will tell you how much medicine to take. Do not take more medicine than directed. Call  emergency for help if you have problems breathing. The medicine will cause constipation. Try to have a bowel movement at least every 2 to 3 days. If you do not have a bowel movement for 3 days, call your doctor or health care professional. Your mouth may get dry. Drinking water, chewing sugarless gum, or sucking on hard candy may help. See your dentist every 6 months. Heat can increase the amount of medicine released from the patch. Do not get the patch hot by using heating pads, heated water beds, electric blankets, and heat lamps. You can bathe or swim while using the patch. But, do not use a sauna or hot tub. Tell you doctor or health care professional if you get a fever. If gel leaks from the patch, wash your skin well with water only. Do not use soap or cleansers containing alcohol. Remove the patch from your skin and discard as instructed above. Medicine from a partly attached or leaking patch can  transfer to a child or pet when they are being held. Exposure of children or pets to the patch can cause serious side effects and even death. If you are going to have a magnetic resonance imaging (MRI) procedure, tell your MRI technician if you have this patch on your body. It must be removed before a MRI. What side effects may I notice from receiving this medicine? Side effects that you should report to your doctor or health care professional as soon as possible: -allergic reactions like skin rash, itching or hives, swelling of the face, lips, or tongue -breathing problems -changes in vision -confusion -feeling faint, lightheaded -fever, flu-like symptoms -hallucination -high or low blood pressure -irregular heartbeat -problems with balance, talking, walking -trouble passing urine or change in the amount of urine -unusual bleeding or bruising -unusually weak or tired Side effects that usually do not require medical attention (report to your doctor or health care professional if they continue or are bothersome): -constipation -dry mouth -itching where the patch was applied -loss of appetite -nausea, vomiting -sweating This list may not describe all possible side effects. Call your doctor for medical advice about side effects. You may report side effects to FDA at 1-800-FDA-1088. Where should I keep my medicine? Keep out of the reach of children. This medicine can be abused. Keep your medicine in a safe place to protect it from theft. Do not share this medicine with anyone. Selling or giving away this medicine is dangerous and against the law. Store below 77 degrees F (25 degrees C). Do not store the patches out of their wrappers. This medicine may cause accidental overdose and death if it is taken by other adults, children, or pets. Flush any unused medicine down the toilet as instructed above to reduce the chance of harm. Do not use the medicine after the expiration date. NOTE: This  sheet is a summary. It may not cover all possible information. If you have questions about this medicine, talk to your doctor, pharmacist, or health care provider.    2016, Elsevier/Gold Standard. (2013-03-04 17:25:22)

## 2016-05-02 NOTE — Progress Notes (Signed)
[facility] Liaison: ? Bed available for placement at [facility] today.  Family agreeable to transfer today. CSW aware. Registration paperwork completed with daughter, Barnetta Chapel. ? Please fax discharge summary to [phone]. ? RN please call report to [phone]. ? Thank You, Freddi Starr RN, [facility] Liaison [phone]

## 2016-05-02 NOTE — Care Management Note (Signed)
Case Management Note  Patient Details  Name: STORMY CONNON MRN: [id number] Date of Birth: 24-Feb-1951  Subjective/Objective:                    Action/Plan:d/c [place].   Expected Discharge Date:                  Expected Discharge Plan:  [place]  In-House Referral:  Clinical Social Work  Discharge planning Services  CM Consult  Post Acute Care Choice:    Choice offered to:     DME Arranged:    DME Agency:     HH Arranged:    [place] Agency:     Status of Service:  Completed, signed off  If discussed at [employer], dates discussed:    Additional Comments:  Dessa Phi, RN 05/02/2016, 11:37 AM

## 2016-05-02 NOTE — Progress Notes (Signed)
Daily Progress Note   Patient Name: [name]       Date: 05/02/2016 DOB: 1951/06/04  Age: 65 y.o. MRN#: [id number] Attending Physician: Robbie Lis, MD Primary Care Physician: Robert Bellow, MD Admit Date: 04/25/2016  Reason for Consultation/Follow-up: Establishing goals of care Life limiting illness: widely metastatic lung cancer.   Subjective: Patient resting in bed, able to participate in conversation this AM.  Reports pain.  Did not open eyes during encounter Daughter at bedside See below   Length of Stay: 7  Current Medications: Scheduled Meds:  . sodium chloride   Intravenous Once  . sodium chloride   Intravenous Once  . sodium chloride   Intravenous Once  . dexamethasone  4 mg Intravenous Q12H  . fentaNYL  25 mcg Transdermal Q72H  . insulin aspart  0-15 Units Subcutaneous TID WC  . insulin glargine  20 Units Subcutaneous Q2200  . magic mouthwash  5 mL Oral QID  . mouth rinse  15 mL Mouth Rinse BID  . mometasone-formoterol  2 puff Inhalation BID  . pantoprazole  40 mg Intravenous Q12H  . polyethylene glycol  17 g Oral Daily  . PRUTECT  1 application Topical Daily    Continuous Infusions:    PRN Meds: acetaminophen **OR** acetaminophen, albuterol, guaiFENesin, LORazepam, morphine injection, ondansetron **OR** ondansetron (ZOFRAN) IV  Physical Exam         Weak frail appearing S1 S2 Diminished Edema 2-3+ on RUE Now has foley Abdomen soft Awakens easily, answers some questions.   Vital Signs: BP 117/64 (BP Location: Left Arm)   Pulse 97   Temp 97.8 F (36.6 C) (Oral)   Resp 18   Ht _0  (1.575 m)   Wt 72.6 kg (160 lb 0.9 oz)   SpO2 100%   BMI 29.27 kg/m  SpO2: SpO2: 100 % O2 Device: O2 Device: Not Delivered O2 Flow Rate:    Intake/output  summary:   Intake/Output Summary (Last 24 hours) at 05/02/16 1059 Last data filed at 05/02/16 0503  Gross per 24 hour  Intake              240 ml  Output              800 ml  Net             -  560 ml   LBM: Last BM Date: 04/25/16 Baseline Weight: Weight: 72.6 kg (160 lb 0.9 oz) Most recent weight: Weight: 72.6 kg (160 lb 0.9 oz)       Palliative Assessment/Data:    Flowsheet Rows   Flowsheet Row Most Recent Value  Intake Tab  Referral Department  Hospitalist  Unit at Time of Referral  Oncology Unit  Palliative Care Primary Diagnosis  Cancer  Date Notified  04/28/16  Palliative Care Type  New Palliative care  Reason for referral  Pain, Non-pain Symptom, Clarify Goals of Care  Date of Admission  04/25/16  Date first seen by Palliative Care  04/29/16  # of days Palliative referral response time  1 Day(s)  # of days IP prior to Palliative referral  3  Clinical Assessment  Palliative Performance Scale Score  30%  Pain Max last 24 hours  5  Pain Min Last 24 hours  4  Dyspnea Max Last 24 Hours  5  Dyspnea Min Last 24 hours  4  Nausea Max Last 24 Hours  4  Nausea Min Last 24 Hours  3  Anxiety Max Last 24 Hours  4  Anxiety Min Last 24 Hours  3  Psychosocial & Spiritual Assessment  Palliative Care Outcomes  Patient/Family meeting held?  Yes  Who was at the meeting?  patient, daughter Orlin Hilding, sister, friend and bedside RN.   Palliative Care Outcomes  Clarified goals of care      Patient Active Problem List   Diagnosis Date Noted  . Encounter for palliative care   . Goals of care, counseling/discussion   . DNR (do not resuscitate)   . Constipation 04/27/2016  . Right shoulder pain   . Bleeding gastrointestinal 04/25/2016  . Hypotension 04/25/2016  . Acute blood loss anemia 04/25/2016  . Cancer of bronchus of left upper lobe ([place]) 04/23/2016  . Metastatic lung cancer (metastasis from lung to other site) ([place]) 04/01/2016  . Brain metastases ([place])  04/01/2016  . Bilateral renal masses 04/01/2016  . Falls 04/01/2016  . Confusion 04/01/2016  . Leukocytosis 04/01/2016  . Cerebral brain hemorrhage ([place]) 04/01/2016  . Metastatic primary lung cancer ([place]) 04/01/2016  . Hypertension 06/23/2012  . Snoring 04/12/2012  . Chest pain 04/10/2012  . Obesity 04/10/2012  . Hyperlipidemia 03/02/2011  . Diabetes mellitus type II, non insulin dependent ([place]) 08/01/2010  . ASTHMA UNSPECIFIED WITH EXACERBATION 01/24/2010  . PALPITATIONS 01/24/2010  . ARRHYTHMIA, HX OF 01/24/2010  . HEMORRHOIDS 06/21/2009  . RECTAL BLEEDING 06/21/2009  . Paroxysmal supraventricular tachycardia ([place]) 07/27/2008  . Asthma, mild persistent 07/27/2008  . GERD 07/27/2008    Palliative Care Assessment & Plan   Patient Profile:    Assessment: Multifactorial anemia  lung cancer with brain mets, skeletal and muscle mets.  Generalized weakness Uncontrolled pain Cancer related anorexia and poor functional status  Recommendations/Plan:  TDF 25 micrograms and IV Morphine PRN. Reports pain during encounter.  Last morphine was 5 hours prior.  Asked bedside RN to give PRN dose.  Low threshold to increase frequency as needed, but daughter requested leave same for now as regimen has been effective for comfort.  Has had 6 doses in last 24 hours.  Family met with hospice liaison yesterday and family hopeful for bed at [facility].    Discussed emotional difficulty of this transition with the patient's daughter.  She has found comfort in speaking with Catalina Antigua from chaplain services.  I paged at her request to see if he  is available to meet with her today.  Appreciate chaplain follow-up.   Code Status:    Code Status Orders        Start     Ordered   04/29/16 1142  Do not attempt resuscitation (DNR)  Continuous    Question Answer Comment  In the event of cardiac or respiratory ARREST Do not call a "code blue"   In the event of cardiac or respiratory ARREST Do not  perform Intubation, CPR, defibrillation or ACLS   In the event of cardiac or respiratory ARREST Use medication by any route, position, wound care, and other measures to relive pain and suffering. May use oxygen, suction and manual treatment of airway obstruction as needed for comfort.      04/29/16 1153    Code Status History    Date Active Date Inactive Code Status Order ID Comments User Context   04/25/2016 10:55 PM 04/29/2016 11:53 AM Partial Code [id number]  [name], MD Inpatient   04/01/2016  7:41 PM 04/03/2016  1:01 PM Full Code [id number]  Vianne Bulls, MD ED       Prognosis:   Days to weeks  Discharge Planning:  [place] was discussed with  Patient's daughter, patient. Bedside RN Thank you for allowing the Palliative Medicine Team to assist in the care of this patient.   Time In: 1020 Time Out: 1040 Total Time 20 Prolonged Time Billed  no       Greater than 50%  of this time was spent counseling and coordinating care related to the above assessment and plan.  Micheline Rough, MD   Please contact Palliative Medicine Team phone at [phone] for questions and concerns.

## 2016-05-20 DEATH — deceased

## 2016-05-28 NOTE — Progress Notes (Signed)
   Radiation Oncology         [phone] ________________________________  Name: [name] MRN: [id number]  Date: 04/26/2016  DOB: 09/10/1950  End of Treatment Note  Diagnosis:   Metastatic lung cancer with brain metastasis     Indication for treatment::  palliative       Radiation treatment dates:   04/05/2016 through 04/26/2016  Site/dose:   The patient was treated with a course of whole brain radiation treatment. This was accomplished to a total dose of 30 gray in 10 fractions at 3 gray per fraction. The patient also received a palliative course of radiation to the left lung which began in the second week area and this also delivered 30 gray in 10 fractions. This was accomplished using a 3 field 3-D conformal technique.  Narrative: The patient tolerated radiation treatment relatively well.   The patient was able to begin tapering her steroids during the course of radiation treatment.  Plan: The patient has completed radiation treatment. The patient will return to radiation oncology clinic for routine followup in one month. I advised the patient to call or return sooner if they have any questions or concerns related to their recovery or treatment. ________________________________  Jodelle Gross, M.D., Ph.D.

## 2016-06-05 ENCOUNTER — Encounter: Payer: Self-pay | Admitting: *Deleted

## 2016-06-05 ENCOUNTER — Ambulatory Visit: Admission: RE | Admit: 2016-06-05 | Payer: PPO | Source: Ambulatory Visit | Admitting: Radiation Oncology

## 2016-06-05 NOTE — Progress Notes (Signed)
Laramie left a message on the answering machine that she was scheduled for her one month follow up appointment with our P.A. Alsion Perkins today at 1400.  Asked her to please call us back at [phone] to let us know if she was not aware of her appointment for today of if she had her appointment scheduled for another day.

## 2017-03-19 IMAGING — CR DG CHEST 2V
2 series · 2 of 2 positions shown · non-contrast
Comparison: 04/05/2016.

CLINICAL DATA: Right anterior shoulder pain after a fall 1 week
ago. Initial encounter.

EXAM:
CHEST  2 VIEW

[w chest lat]
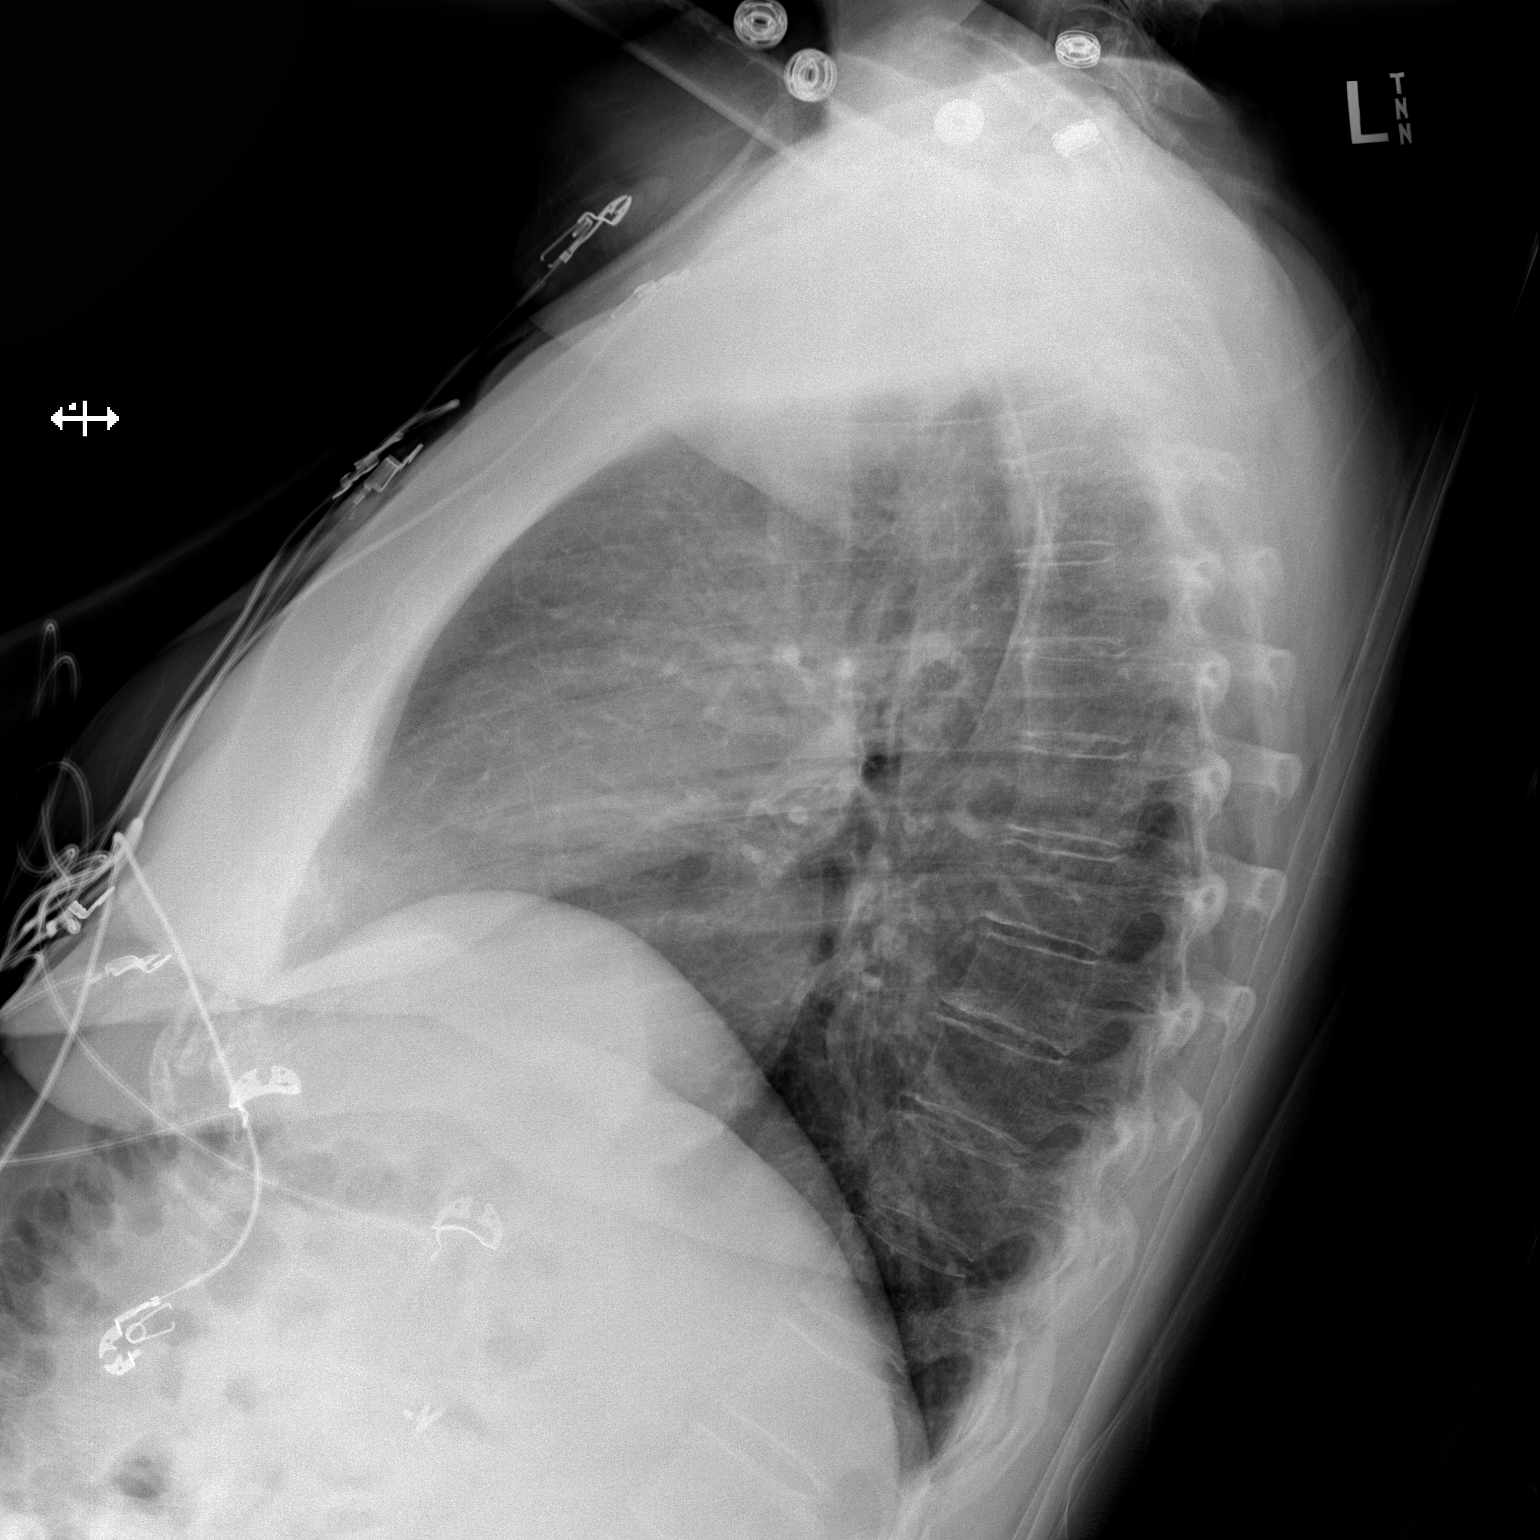

[x chest ap]
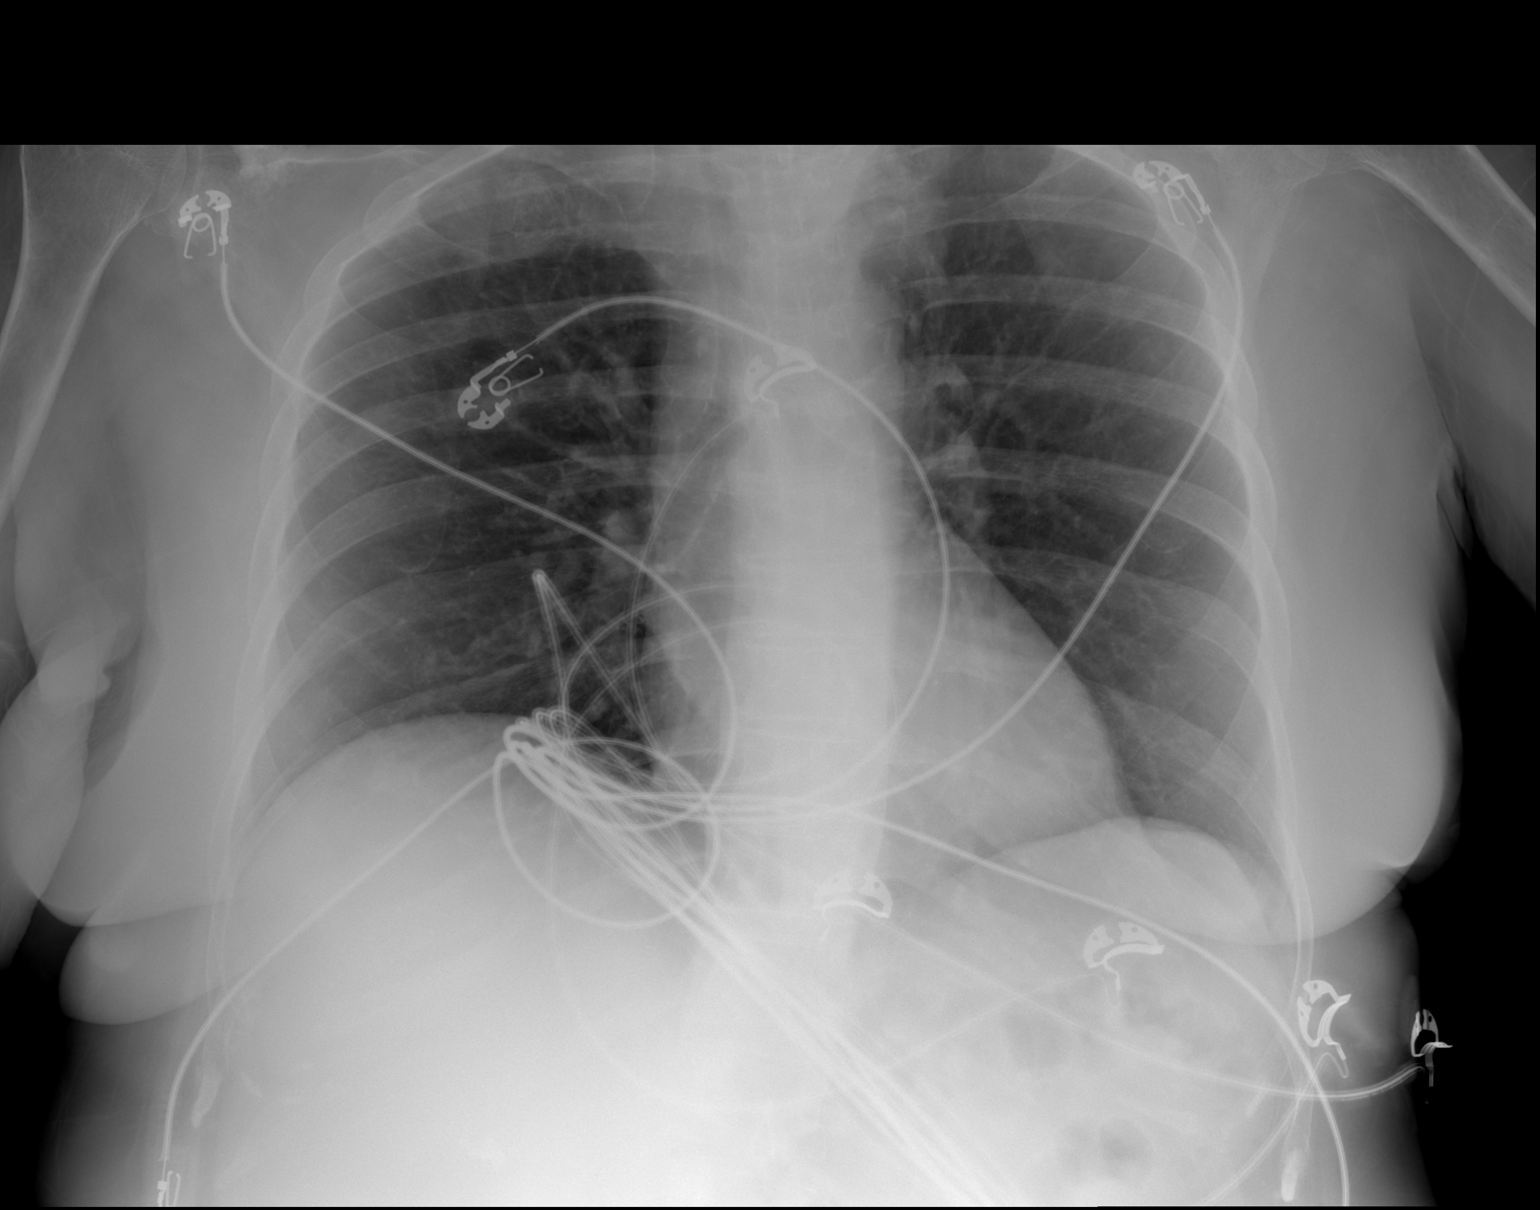

[2 of 2 positions shown; findings below may reference images not displayed]

FINDINGS: Trachea is midline. Heart size normal. Lungs are clear. No pleural
fluid.
IMPRESSION: No acute findings.

## 2017-03-19 IMAGING — CR DG SHOULDER 2+V*R*
2 series · 2 of 2 positions shown · non-contrast
Comparison: None.

CLINICAL DATA: Right anterior shoulder pain after a fall 1 week
ago, initial encounter.

EXAM:
RIGHT SHOULDER - 2+ VIEW

[x shoulder ap right (1 of 2)]
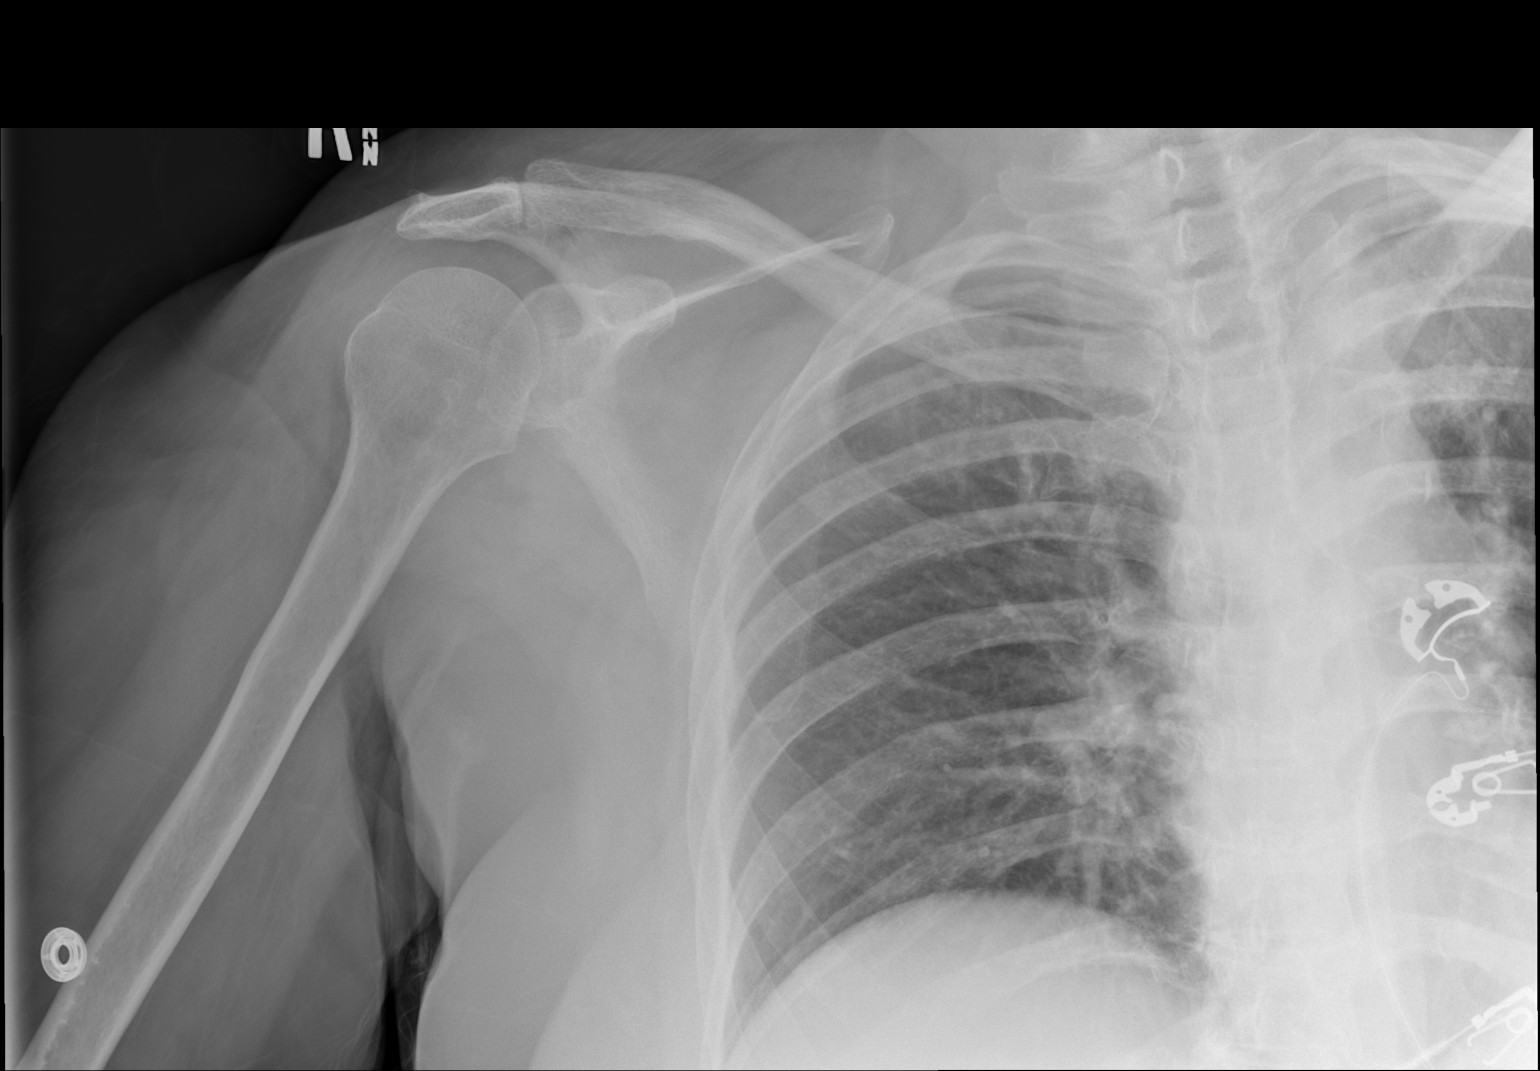

[x shoulder ap right (2 of 2)]
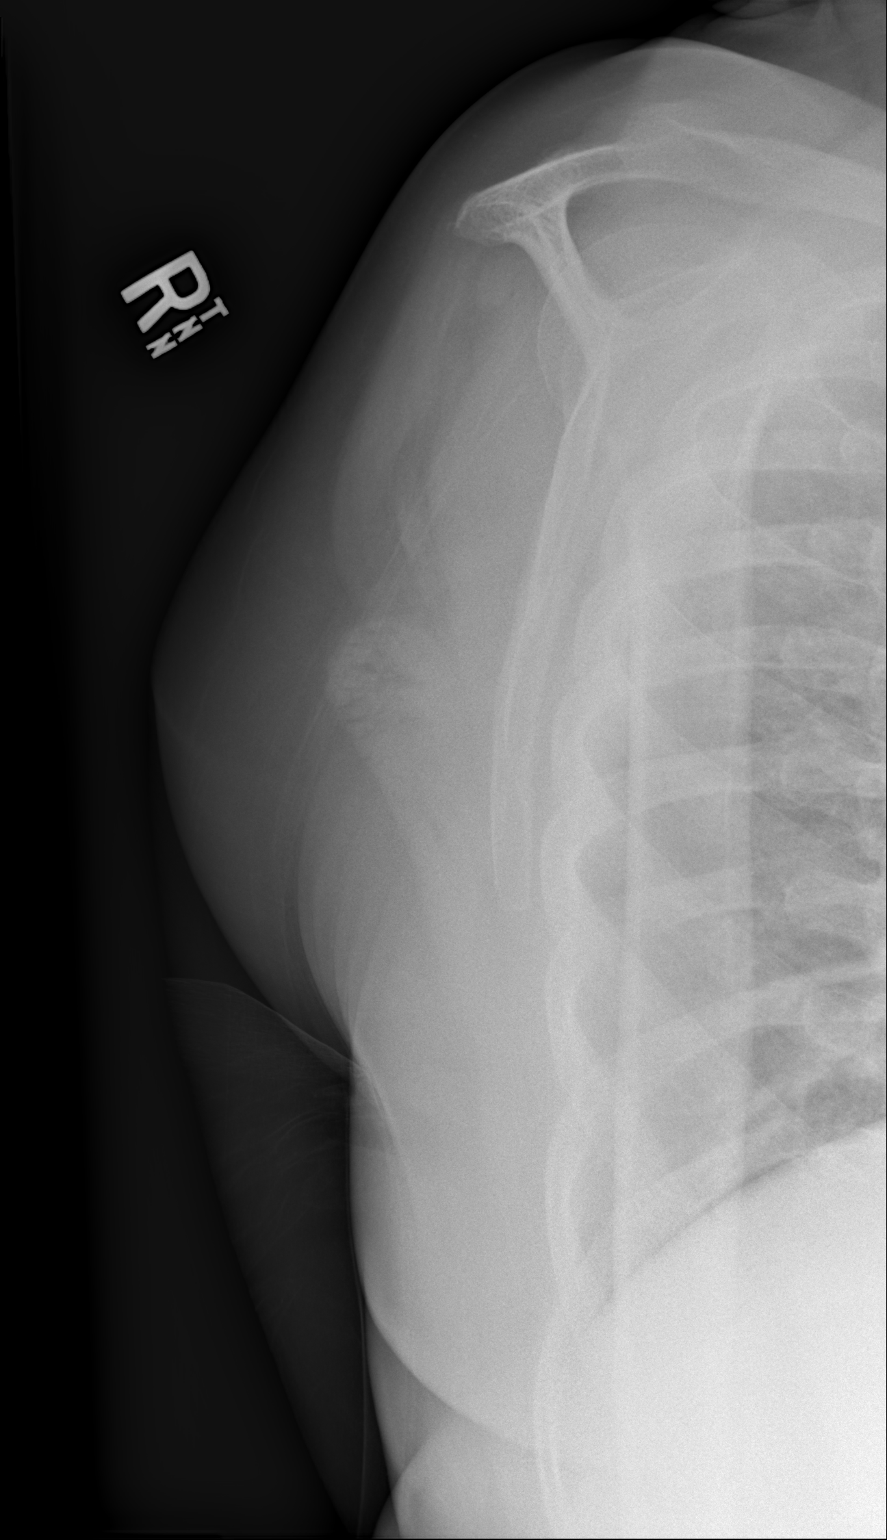

[2 of 2 positions shown; findings below may reference images not displayed]

FINDINGS: No fracture or dislocation. Degenerative changes are seen in the
right acromioclavicular joint. Visualized portion of the right chest
is unremarkable.
IMPRESSION: 1. No acute findings.
2. Right acromioclavicular joint osteoarthritis.
